# Patient Record
Sex: Male | Born: 1950 | Race: Black or African American | Hispanic: No | Marital: Single | Smoking: Former smoker
Health system: Southern US, Community
[De-identification: ages and names within clinical notes are randomized; demographics above are authoritative.]

## PROBLEM LIST (undated history)

## (undated) ENCOUNTER — Emergency Department (HOSPITAL_COMMUNITY): Admission: EM | Discharge status: Against Medical Advice | Payer: Self-pay

## (undated) DIAGNOSIS — M1990 Unspecified osteoarthritis, unspecified site: Secondary | ICD-10-CM

## (undated) DIAGNOSIS — D649 Anemia, unspecified: Secondary | ICD-10-CM

## (undated) DIAGNOSIS — R942 Abnormal results of pulmonary function studies: Secondary | ICD-10-CM

## (undated) DIAGNOSIS — C159 Malignant neoplasm of esophagus, unspecified: Secondary | ICD-10-CM

## (undated) DIAGNOSIS — M109 Gout, unspecified: Secondary | ICD-10-CM

## (undated) HISTORY — DX: Unspecified osteoarthritis, unspecified site: M19.90

## (undated) HISTORY — DX: Anemia, unspecified: D64.9

## (undated) HISTORY — PX: PORTACATH PLACEMENT: SHX2246

## (undated) HISTORY — PX: EXPLORATORY LAPAROTOMY: SUR591

## (undated) HISTORY — PX: HERNIA REPAIR: SHX51

## (undated) HISTORY — DX: Gout, unspecified: M10.9

## (undated) HISTORY — DX: Malignant neoplasm of esophagus, unspecified: C15.9

---

## 2006-11-24 ENCOUNTER — Ambulatory Visit: Payer: Self-pay | Admitting: Family Medicine

## 2006-11-24 DIAGNOSIS — G43909 Migraine, unspecified, not intractable, without status migrainosus: Secondary | ICD-10-CM | POA: Insufficient documentation

## 2006-11-24 DIAGNOSIS — M545 Low back pain: Secondary | ICD-10-CM

## 2006-11-24 DIAGNOSIS — Z87898 Personal history of other specified conditions: Secondary | ICD-10-CM | POA: Insufficient documentation

## 2006-11-25 ENCOUNTER — Encounter (INDEPENDENT_AMBULATORY_CARE_PROVIDER_SITE_OTHER): Payer: Self-pay | Admitting: Family Medicine

## 2006-11-28 ENCOUNTER — Encounter (INDEPENDENT_AMBULATORY_CARE_PROVIDER_SITE_OTHER): Payer: Self-pay | Admitting: Family Medicine

## 2006-12-12 ENCOUNTER — Ambulatory Visit: Payer: Self-pay | Admitting: Family Medicine

## 2006-12-12 DIAGNOSIS — M25569 Pain in unspecified knee: Secondary | ICD-10-CM

## 2006-12-12 DIAGNOSIS — F341 Dysthymic disorder: Secondary | ICD-10-CM

## 2007-01-02 ENCOUNTER — Encounter (INDEPENDENT_AMBULATORY_CARE_PROVIDER_SITE_OTHER): Payer: Self-pay | Admitting: Family Medicine

## 2007-01-09 ENCOUNTER — Ambulatory Visit: Payer: Self-pay | Admitting: Family Medicine

## 2007-01-09 ENCOUNTER — Telehealth (INDEPENDENT_AMBULATORY_CARE_PROVIDER_SITE_OTHER): Payer: Self-pay | Admitting: *Deleted

## 2007-01-09 DIAGNOSIS — I10 Essential (primary) hypertension: Secondary | ICD-10-CM

## 2007-01-20 ENCOUNTER — Telehealth (INDEPENDENT_AMBULATORY_CARE_PROVIDER_SITE_OTHER): Payer: Self-pay | Admitting: *Deleted

## 2007-01-20 ENCOUNTER — Ambulatory Visit (HOSPITAL_COMMUNITY): Admission: RE | Admit: 2007-01-20 | Discharge: 2007-01-20 | Payer: Self-pay | Admitting: Family Medicine

## 2007-02-16 HISTORY — PX: COLONOSCOPY: SHX174

## 2007-02-22 ENCOUNTER — Telehealth (INDEPENDENT_AMBULATORY_CARE_PROVIDER_SITE_OTHER): Payer: Self-pay | Admitting: Family Medicine

## 2007-02-28 ENCOUNTER — Encounter (INDEPENDENT_AMBULATORY_CARE_PROVIDER_SITE_OTHER): Payer: Self-pay | Admitting: Family Medicine

## 2007-03-20 ENCOUNTER — Ambulatory Visit (HOSPITAL_COMMUNITY): Admission: RE | Admit: 2007-03-20 | Discharge: 2007-03-20 | Payer: Self-pay | Admitting: Gastroenterology

## 2007-03-20 ENCOUNTER — Encounter: Payer: Self-pay | Admitting: Gastroenterology

## 2007-03-20 ENCOUNTER — Encounter (INDEPENDENT_AMBULATORY_CARE_PROVIDER_SITE_OTHER): Payer: Self-pay | Admitting: Family Medicine

## 2007-03-20 ENCOUNTER — Ambulatory Visit: Payer: Self-pay | Admitting: Gastroenterology

## 2007-03-29 ENCOUNTER — Encounter (INDEPENDENT_AMBULATORY_CARE_PROVIDER_SITE_OTHER): Payer: Self-pay | Admitting: Family Medicine

## 2007-04-05 ENCOUNTER — Ambulatory Visit: Payer: Self-pay | Admitting: Family Medicine

## 2007-04-05 DIAGNOSIS — F528 Other sexual dysfunction not due to a substance or known physiological condition: Secondary | ICD-10-CM

## 2007-05-23 ENCOUNTER — Ambulatory Visit: Payer: Self-pay | Admitting: Family Medicine

## 2007-06-28 ENCOUNTER — Ambulatory Visit: Payer: Self-pay | Admitting: Family Medicine

## 2007-06-28 LAB — CONVERTED CEMR LAB
Bilirubin Urine: NEGATIVE
Glucose, Urine, Semiquant: NEGATIVE
Ketones, urine, test strip: NEGATIVE
Nitrite: NEGATIVE
WBC Urine, dipstick: NEGATIVE

## 2007-07-25 ENCOUNTER — Encounter (INDEPENDENT_AMBULATORY_CARE_PROVIDER_SITE_OTHER): Payer: Self-pay | Admitting: Family Medicine

## 2007-08-22 ENCOUNTER — Ambulatory Visit: Payer: Self-pay | Admitting: Family Medicine

## 2007-08-22 DIAGNOSIS — I359 Nonrheumatic aortic valve disorder, unspecified: Secondary | ICD-10-CM | POA: Insufficient documentation

## 2007-09-05 ENCOUNTER — Telehealth (INDEPENDENT_AMBULATORY_CARE_PROVIDER_SITE_OTHER): Payer: Self-pay | Admitting: *Deleted

## 2007-09-20 ENCOUNTER — Ambulatory Visit: Payer: Self-pay | Admitting: Family Medicine

## 2007-09-21 ENCOUNTER — Telehealth (INDEPENDENT_AMBULATORY_CARE_PROVIDER_SITE_OTHER): Payer: Self-pay | Admitting: Family Medicine

## 2007-09-25 ENCOUNTER — Encounter (INDEPENDENT_AMBULATORY_CARE_PROVIDER_SITE_OTHER): Payer: Self-pay | Admitting: Family Medicine

## 2007-10-02 ENCOUNTER — Encounter (INDEPENDENT_AMBULATORY_CARE_PROVIDER_SITE_OTHER): Payer: Self-pay | Admitting: Family Medicine

## 2007-10-18 ENCOUNTER — Telehealth (INDEPENDENT_AMBULATORY_CARE_PROVIDER_SITE_OTHER): Payer: Self-pay | Admitting: *Deleted

## 2007-12-22 ENCOUNTER — Telehealth (INDEPENDENT_AMBULATORY_CARE_PROVIDER_SITE_OTHER): Payer: Self-pay | Admitting: *Deleted

## 2008-01-03 ENCOUNTER — Telehealth (INDEPENDENT_AMBULATORY_CARE_PROVIDER_SITE_OTHER): Payer: Self-pay | Admitting: *Deleted

## 2008-04-10 ENCOUNTER — Telehealth (INDEPENDENT_AMBULATORY_CARE_PROVIDER_SITE_OTHER): Payer: Self-pay | Admitting: *Deleted

## 2010-03-17 NOTE — Letter (Signed)
Summary: disability determination  disability determination   Imported By: Curtis Sites 04/07/2007 11:24:32  _____________________________________________________________________  External Attachment:    Type:   Image     Comment:   External Document

## 2010-04-03 ENCOUNTER — Encounter (INDEPENDENT_AMBULATORY_CARE_PROVIDER_SITE_OTHER): Payer: Self-pay

## 2010-04-08 NOTE — Letter (Signed)
Summary: Recall Colonoscopy/Endoscopy, Change to Office Visit  [facility]  [address]   [place], [place] [address]   Phone: [phone]  Fax: [phone]      April 03, 2010   Anthony Cameron [address] [place], [place]  [address] 08-Jul-1950   Dear [name],   According to our records, it is time for you to schedule a Colonoscopy/Endoscopy. However, after reviewing your medical record, we recommend an office visit in order to determine your need for a repeat procedure.  Please call [phone] at your convenience to schedule an office visit. If you have any questions or concerns, please feel free to contact our office.   Sincerely,   Cloria Spring LPN  [facility] Ph: [phone]   Fax: [phone]

## 2010-06-30 NOTE — Op Note (Signed)
NAMEDAVIDE, RISDON            ACCOUNT NO.:  [id number]   MEDICAL RECORD NO.:  [id number]          PATIENT TYPE:  AMB   LOCATION:  DAY                           FACILITY:  [facility]   PHYSICIAN:  Kassie Mends, M.D.      DATE OF BIRTH:  05-01-1950   DATE OF PROCEDURE:  03/20/2007  DATE OF DISCHARGE:                               OPERATIVE REPORT   PROCEDURE:  Colonoscopy with snare cautery and cold forceps polypectomy.   INDICATIONS:  Mr. Demore is a 60 year old male who presents for  average risk colon cancer screening.   FINDINGS:  1. Multiple rectosigmoid polyps removed via snare.  2. Two 3-4 mm polyps removed via cold forceps.  The polyp were located      in the rectum.  3. An 8 mm sessile rectal polyp removed via snare cautery.  4. Otherwise no internal hemorrhoids, diverticula, inflammatory      changes, or arteriovenous malformations.   RECOMMENDATIONS:  1. Screening colonoscopy in 3 years due to 8 to 10 polyps being      removed from his rectosigmoid area.  2. He should follow a high fiber diet.  He is given a handout on high-      fiber diet.  3. He should have no aspirin, NSAIDs or anticoagulation for 7 days.  4. Will call Mr. Keena with the results of his biopsies.   DESCRIPTION OF PROCEDURE:  Physical exam was performed.  Informed  consent was obtained from the patient explaining the benefits, risks and  alternatives to the procedure.  The patient was connected to the monitor  and placed in left lateral position.  Continuous oxygen was provided by  nasal cannula and IV medicine administered through an indwelling  cannula.  After administration of sedation and rectal exam, the  patient's rectum was intubated and the scope was advanced under direct  visualization to the cecum.  Scope was removed slowly by carefully  examining the color, texture, anatomy, and integrity of the mucosa on  the way out.  The patient was recovered in endoscopy and discharged home  in  satisfactory condition.      Kassie Mends, M.D.  Electronically Signed     SM/MEDQ  D:  03/20/2007  T:  03/20/2007  Job:  [id number]   cc:   Franchot Heidelberg, M.D.

## 2011-05-27 ENCOUNTER — Emergency Department (HOSPITAL_COMMUNITY)
Admission: EM | Admit: 2011-05-27 | Discharge: 2011-05-27 | Disposition: A | Discharge status: Home / Self Care | Payer: Self-pay | Attending: Emergency Medicine | Admitting: Emergency Medicine

## 2011-05-27 ENCOUNTER — Emergency Department (HOSPITAL_COMMUNITY): Payer: Self-pay

## 2011-05-27 DIAGNOSIS — IMO0001 Reserved for inherently not codable concepts without codable children: Secondary | ICD-10-CM | POA: Insufficient documentation

## 2011-05-27 DIAGNOSIS — S161XXA Strain of muscle, fascia and tendon at neck level, initial encounter: Secondary | ICD-10-CM

## 2011-05-27 DIAGNOSIS — S139XXA Sprain of joints and ligaments of unspecified parts of neck, initial encounter: Secondary | ICD-10-CM | POA: Insufficient documentation

## 2011-05-27 DIAGNOSIS — M542 Cervicalgia: Secondary | ICD-10-CM | POA: Insufficient documentation
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
## 2011-05-27 NOTE — ED Notes (Signed)
Pt DC to home with steady gait 

## 2011-05-27 NOTE — Discharge Instructions (Signed)
Cervical Strain Care After A cervical strain is when the muscles and ligaments in your neck have been stretched. The bones are not broken. If you had any problems moving your arms or legs immediately after the injury, even if the problem has gone away, make sure to tell this to your caregiver.  HOME CARE INSTRUCTIONS   While awake, apply ice packs to the neck or areas of pain about every 1 to 2 hours, for 15 to 20 minutes at a time. Do this for 2 days. If you were given a cervical collar for support, ask your caregiver if you may remove it for bathing or applying ice.   If given a cervical collar, wear as instructed. Do not remove any collar unless instructed by a caregiver.   Only take over-the-counter or prescription medicines for pain, discomfort, or fever as directed by your caregiver.  Recheck with the hospital or clinic after a radiologist has read your X-rays. Recheck with the hospital or clinic to make sure the initial readings are correct. Do this also to determine if you need further studies. It is your responsibility to find out your X-ray results. X-rays are sometimes repeated in one week to ten days. These are often repeated to make sure that a hairline fracture was not overlooked. Ask your caregiver how you are to find out about your radiology (X-ray) results. SEEK IMMEDIATE MEDICAL CARE IF:   You have increasing pain in your neck.   You develop difficulties swallowing or breathing.   You have numbness, weakness, or movement problems in the arms or legs.   You have difficulty walking.   You develop bowel or bladder retention or incontinence.   You have problems with walking.  MAKE SURE YOU:   Understand these instructions.   Will watch your condition.   Will get help right away if you are not doing well or get worse.  Document Released: 02/01/2005 Document Revised: 10/14/2010 Document Reviewed: 09/15/2007 [facility] Patient Information 2012 [place], [place].   The x-rays  show no acute bony abnormalities.  Take ibuprofen up to 800 mg every 8 hrs with food.  Find a primary care MD.

## 2011-05-27 NOTE — ED Provider Notes (Signed)
Medical screening examination/treatment/procedure(s) were performed by non-physician practitioner and as supervising physician I was immediately available for consultation/collaboration.  Sumedha Munnerlyn, MD 05/27/11 2200 

## 2014-05-02 ENCOUNTER — Encounter: Payer: Self-pay | Admitting: Gastroenterology

## 2014-05-17 DIAGNOSIS — C159 Malignant neoplasm of esophagus, unspecified: Secondary | ICD-10-CM

## 2014-05-17 HISTORY — DX: Malignant neoplasm of esophagus, unspecified: C15.9

## 2014-05-27 ENCOUNTER — Encounter: Payer: Self-pay | Admitting: Gastroenterology

## 2014-05-27 ENCOUNTER — Other Ambulatory Visit: Payer: Self-pay

## 2014-05-27 ENCOUNTER — Ambulatory Visit (INDEPENDENT_AMBULATORY_CARE_PROVIDER_SITE_OTHER): Payer: Self-pay | Admitting: Gastroenterology

## 2014-05-27 VITALS — BP 119/57 | HR 73 | Temp 98.5°F | Ht 72.0 in | Wt 148.6 lb

## 2014-05-27 DIAGNOSIS — R1310 Dysphagia, unspecified: Secondary | ICD-10-CM | POA: Insufficient documentation

## 2014-05-27 DIAGNOSIS — R1319 Other dysphagia: Secondary | ICD-10-CM | POA: Insufficient documentation

## 2014-05-27 DIAGNOSIS — R1314 Dysphagia, pharyngoesophageal phase: Secondary | ICD-10-CM

## 2014-05-27 DIAGNOSIS — K219 Gastro-esophageal reflux disease without esophagitis: Secondary | ICD-10-CM

## 2014-05-27 DIAGNOSIS — D649 Anemia, unspecified: Secondary | ICD-10-CM

## 2014-05-27 DIAGNOSIS — Z86010 Personal history of colonic polyps: Secondary | ICD-10-CM | POA: Insufficient documentation

## 2014-05-27 MED ORDER — ESOMEPRAZOLE MAGNESIUM 20 MG PO CPDR: 20.0000 mg | DELAYED_RELEASE_CAPSULE | Freq: Two times a day (BID) | ORAL | Status: DC

## 2014-05-27 NOTE — Assessment & Plan Note (Signed)
History of multiple polyps removed back in 2009. Patient did not have follow-up colonoscopy in 2012 as recommended. Normocytic anemia, plus or minus iron deficiency as previously noted. Encouraged colonoscopy at this time. Patient agreeable.  I have discussed the risks, alternatives, benefits with regards to but not limited to the risk of reaction to medication, bleeding, infection, perforation and the patient is agreeable to proceed. Written consent to be obtained.

## 2014-05-27 NOTE — Patient Instructions (Signed)
1. Colonoscopy and upper endoscopy with possible esophageal stretching with Dr. [name]. See separate instructions.  2. Nexium 20mg  twice daily before breakfast and evening meal. Samples provided.  3. Limit BC powders and naproxyn as much as possible until you have your procedures.
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
## 2014-05-27 NOTE — Assessment & Plan Note (Signed)
New onset retrosternal burning/throat burning with meals. Painful with eating and solid food dysphagia. Has affected his ability to eat. Patient really denies any weight loss. None documented in the past one month. Differential diagnosis includes erosive lash ulcerative reflux esophagitis, candida or viral esophagitis, malignancy, esophageal stricture. Of note patient has had normocytic anemia dating back at least to July 2015. Has been on iron therapy. Iron was 28 at the time but other anemia parameters unavailable. His ferritin was normal. Consumes aspirin products regularly. Cannot exclude peptic ulcer disease.  Recommend Nexium 20 mg twice a day, samples provided. Patient has no medication coverage. EGD with esophageal dilation near future with Dr. [name].  I have discussed the risks, alternatives, benefits with regards to but not limited to the risk of reaction to medication, bleeding, infection, perforation and the patient is agreeable to proceed. Written consent to be obtained.

## 2014-05-27 NOTE — Progress Notes (Signed)
cc'ed to pcp °

## 2014-06-03 ENCOUNTER — Encounter (HOSPITAL_COMMUNITY)
Admission: RE | Disposition: A | Discharge status: Home / Self Care | Payer: Self-pay | Source: Ambulatory Visit | Attending: Gastroenterology

## 2014-06-03 ENCOUNTER — Encounter (HOSPITAL_COMMUNITY): Payer: Self-pay | Admitting: *Deleted

## 2014-06-03 ENCOUNTER — Other Ambulatory Visit: Payer: Self-pay

## 2014-06-03 ENCOUNTER — Ambulatory Visit (HOSPITAL_COMMUNITY)
Admission: RE | Admit: 2014-06-03 | Discharge: 2014-06-03 | Disposition: A | Discharge status: Home / Self Care | Payer: Medicaid Other | Source: Ambulatory Visit | Attending: Gastroenterology | Admitting: Gastroenterology

## 2014-06-03 ENCOUNTER — Telehealth: Payer: Self-pay | Admitting: Gastroenterology

## 2014-06-03 DIAGNOSIS — K648 Other hemorrhoids: Secondary | ICD-10-CM | POA: Diagnosis not present

## 2014-06-03 DIAGNOSIS — C159 Malignant neoplasm of esophagus, unspecified: Secondary | ICD-10-CM | POA: Insufficient documentation

## 2014-06-03 DIAGNOSIS — Z1211 Encounter for screening for malignant neoplasm of colon: Secondary | ICD-10-CM | POA: Diagnosis present

## 2014-06-03 DIAGNOSIS — R634 Abnormal weight loss: Secondary | ICD-10-CM | POA: Diagnosis present

## 2014-06-03 DIAGNOSIS — K2950 Unspecified chronic gastritis without bleeding: Secondary | ICD-10-CM | POA: Insufficient documentation

## 2014-06-03 DIAGNOSIS — M1990 Unspecified osteoarthritis, unspecified site: Secondary | ICD-10-CM | POA: Insufficient documentation

## 2014-06-03 DIAGNOSIS — R1314 Dysphagia, pharyngoesophageal phase: Secondary | ICD-10-CM

## 2014-06-03 DIAGNOSIS — D649 Anemia, unspecified: Secondary | ICD-10-CM | POA: Insufficient documentation

## 2014-06-03 DIAGNOSIS — R1310 Dysphagia, unspecified: Secondary | ICD-10-CM | POA: Diagnosis present

## 2014-06-03 DIAGNOSIS — K219 Gastro-esophageal reflux disease without esophagitis: Secondary | ICD-10-CM | POA: Diagnosis not present

## 2014-06-03 DIAGNOSIS — K2289 Other specified disease of esophagus: Secondary | ICD-10-CM

## 2014-06-03 DIAGNOSIS — Z7982 Long term (current) use of aspirin: Secondary | ICD-10-CM | POA: Diagnosis not present

## 2014-06-03 DIAGNOSIS — M109 Gout, unspecified: Secondary | ICD-10-CM | POA: Insufficient documentation

## 2014-06-03 DIAGNOSIS — Z860100 Personal history of colon polyps, unspecified: Secondary | ICD-10-CM

## 2014-06-03 DIAGNOSIS — Z87891 Personal history of nicotine dependence: Secondary | ICD-10-CM | POA: Insufficient documentation

## 2014-06-03 DIAGNOSIS — Z791 Long term (current) use of non-steroidal anti-inflammatories (NSAID): Secondary | ICD-10-CM | POA: Insufficient documentation

## 2014-06-03 DIAGNOSIS — K228 Other specified diseases of esophagus: Secondary | ICD-10-CM

## 2014-06-03 DIAGNOSIS — Z79899 Other long term (current) drug therapy: Secondary | ICD-10-CM | POA: Insufficient documentation

## 2014-06-03 HISTORY — PX: ESOPHAGOGASTRODUODENOSCOPY: SHX5428

## 2014-06-03 HISTORY — PX: COLONOSCOPY: SHX5424

## 2014-06-03 HISTORY — PX: ESOPHAGEAL DILATION: SHX303

## 2014-06-03 SURGERY — COLONOSCOPY
Anesthesia: Moderate Sedation

## 2014-06-03 MED ORDER — MEPERIDINE HCL 100 MG/ML IJ SOLN
INTRAMUSCULAR | Status: AC
  Filled 2014-06-03: qty 2

## 2014-06-03 MED ORDER — SODIUM CHLORIDE 0.9 % IV SOLN
INTRAVENOUS | Status: DC
  Administered 2014-06-03: 11:00:00 via INTRAVENOUS

## 2014-06-03 MED ORDER — LIDOCAINE VISCOUS 2 % MT SOLN
OROMUCOSAL | Status: DC | PRN
  Administered 2014-06-03: 1 via OROMUCOSAL

## 2014-06-03 MED ORDER — MIDAZOLAM HCL 5 MG/5ML IJ SOLN
INTRAMUSCULAR | Status: AC
  Filled 2014-06-03: qty 10

## 2014-06-03 MED ORDER — MIDAZOLAM HCL 5 MG/5ML IJ SOLN
INTRAMUSCULAR | Status: DC | PRN
  Administered 2014-06-03 (×2): 2 mg via INTRAVENOUS
  Administered 2014-06-03: 1 mg via INTRAVENOUS
  Administered 2014-06-03: 2 mg via INTRAVENOUS
  Administered 2014-06-03: 1 mg via INTRAVENOUS

## 2014-06-03 MED ORDER — STERILE WATER FOR IRRIGATION IR SOLN
Status: DC | PRN
  Administered 2014-06-03: 12:00:00

## 2014-06-03 MED ORDER — MEPERIDINE HCL 100 MG/ML IJ SOLN
INTRAMUSCULAR | Status: DC | PRN
  Administered 2014-06-03 (×2): 25 mg via INTRAVENOUS

## 2014-06-03 MED ORDER — LIDOCAINE VISCOUS 2 % MT SOLN
OROMUCOSAL | Status: AC
  Filled 2014-06-03: qty 15

## 2014-06-03 MED ORDER — MINERAL OIL PO OIL
TOPICAL_OIL | ORAL | Status: AC
  Filled 2014-06-03: qty 30

## 2014-06-03 NOTE — Telephone Encounter (Signed)
PT NEEDS TO SEE DR. Ardis Hughs IN [place] FOR AN ENDOSCOPIC ULTRASOUND, Dx DISTAL ESOPHAGEAL MASS. HE NEEDS A CT OF CHEST AND ABDOMEN W/ IV AND ORAL CONTRAST AND TO SEE THE [facility] ONCLOLOGY. OPV IN 6 MOS W/ SLF E30 ESOPHAGEAL MASS.

## 2014-06-03 NOTE — Op Note (Signed)
[facility] [address] [place], [address]   ENDOSCOPY PROCEDURE REPORT  PATIENT: Anthony Cameron, Anthony Cameron  MR#: [id number] BIRTHDATE: 05-11-1950 , 18  yrs. old GENDER: male  ENDOSCOPIST: [name], MD REFERRED LS:[id number] McElroy, PA-C PROCEDURE DATE: 07-03-14 PROCEDURE:   EGD w/ biopsy  INDICATIONS:dysphagia.   weight loss. MEDICATIONS: Versed 1 mg IV and Demerol 25 mg IV TOPICAL ANESTHETIC:   Viscous Xylocaine ASA CLASS:  DESCRIPTION OF PROCEDURE:     Physical exam was performed.  Informed consent was obtained from the patient after explaining the benefits, risks, and alternatives to the procedure.  The patient was connected to the monitor and placed in the left lateral position.  Continuous oxygen was provided by nasal cannula and IV medicine administered through an indwelling cannula.  After administration of sedation, the patients esophagus was intubated and the EC-3890Li ([id number])  endoscope was advanced under direct visualization to the second portion of the duodenum.  The scope was removed slowly by carefully examining the color, texture, anatomy, and integrity of the mucosa on the way out.  The patient was recovered in endoscopy and discharged home in satisfactory condition.   ESOPHAGUS: A circumferential mass was found in the distal esophagus. Multiple biopsies were performed using cold forceps. UES 15 CM FROM THE TEETH. MASS STARTS 38 CM FROM THE TEETH AND EXTENDS TOTHE GE JXN 46 CM FROMTHE TEETH.   STOMACH: Mild erosive gastritis (inflammation) was found in the gastric antrum.  Multiple biopsies were performed using cold forceps. DUODENUM: The duodenal mucosa showed no abnormalities in the bulb and 2nd part of the duodenum. COMPLICATIONS: There were no immediate complications.  ENDOSCOPIC IMPRESSION: 1.   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   MILD Erosive gastritis  RECOMMENDATIONS: Use NEXIUM 30 minutes prior to your first  meal. FOLLOW A SOFT MECHANICAL DIET. CIB, ENSURE, OR BOOST QID AWAIT BIOPSY. REFER TO SEE DR.  Ardis Hughs FOR AN ENDOSCOPIC ULTRASOUND & CT OF CHEST AND ABDOMEN ONCOLOGY REFERRAL Next colonoscopy in 5 years.  REPEAT EXAM: eSigned:  [name], MD 2014/07/03 1:57 PM   CPT CODES: ICD CODES:  The ICD and CPT codes recommended by this software are interpretations from the data that the clinical staff has captured with the software.  The verification of the translation of this report to the ICD and CPT codes and modifiers is the sole responsibility of the health care institution and practicing physician where this report was generated.  [place]. will not be held responsible for the validity of the ICD and CPT codes included on this report.  AMA assumes no liability for data contained or not contained herein. CPT is a Designer, television/film set of the [employer].

## 2014-06-03 NOTE — Telephone Encounter (Signed)
Called pt and spoke with pts sister Kieth Brightly) and she is aware of referrals and of CT scan appointment. She is also aware of needing to pick up contrast in xray.

## 2014-06-03 NOTE — Op Note (Signed)
[facility] [address] [place], [address]   COLONOSCOPY PROCEDURE REPORT  PATIENT: Anthony Cameron, Anthony Cameron  MR#: [id number] BIRTHDATE: 10/30/1950 , 10  yrs. old GENDER: male ENDOSCOPIST: [name], MD REFERRED BW:[id number] McElroy, PA-C PROCEDURE DATE:  Jun 15, 2014 PROCEDURE:   Colonoscopy, screening INDICATIONS:high risk patient with personal history of colonic polyps. MEDICATIONS: Demerol 50 mg IV and Versed 5 mg IV  DESCRIPTION OF PROCEDURE:    Physical exam was performed.  Informed consent was obtained from the patient after explaining the benefits, risks, and alternatives to procedure.  The patient was connected to monitor and placed in left lateral position. Continuous oxygen was provided by nasal cannula and IV medicine administered through an indwelling cannula.  After administration of sedation and rectal exam, the patients rectum was intubated and the EC-3890Li ([id number])  colonoscope was advanced under direct visualization to the cecum.  The scope was removed slowly by carefully examining the color, texture, anatomy, and integrity mucosa on the way out.  The patient was recovered in endoscopy and discharged home in satisfactory condition.    COLON FINDINGS: The colonic mucosa appeared normal and Moderate sized internal hemorrhoids were found. PREP QUALITY: good. CECAL W/D TIME: 12        minutes  COMPLICATIONS: None  ENDOSCOPIC IMPRESSION: 1.   The colonic mucosa appeared normal 2.   Moderate sized internal hemorrhoids  RECOMMENDATIONS: Use NEXIUM 30 minutes prior to your first meal. FOLLOW A SOFT MECHANICAL DIET. CIB, ENSURE, OR BOOST QID AWAIT BIOPSY. REFER TO SEE DR.  Ardis Hughs FOR AN ENDOSCOPIC ULTRASOUND & CT OF CHEST AND ABDOMEN ONCOLOGY REFERRAL Next colonoscopy in 5 years.    _______________________________ Lorrin MaisDanie Binder, MD 06/15/14 2:00 PM    CPT CODES: ICD CODES:  The ICD and CPT codes recommended by this  software are interpretations from the data that the clinical staff has captured with the software.  The verification of the translation of this report to the ICD and CPT codes and modifiers is the sole responsibility of the health care institution and practicing physician where this report was generated.  [place]. will not be held responsible for the validity of the ICD and CPT codes included on this report.  AMA assumes no liability for data contained or not contained herein. CPT is a Designer, television/film set of the [employer].

## 2014-06-03 NOTE — Discharge Instructions (Signed)
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 You have small  internal hemorrhoids. You have gastritis MOST LIKELY DUE TO aspirin use.  I biopsied your ESOPHAGUS AND stomach.   Use NEXIUM 30 minutes prior to your first meal.  FOLLOW A SOFT MECHANICAL DIET.  MEATS SHOULD BE CHOPPED OR GROUND ONLY. DO NOT EAT CHUNKS OF ANYTHING. SEE INFO BELOW.  YOUR BIOPSY RESULTS WILL BE AVAILABLE BY Friday APR 22.     YOU NEED TO SEE DR. Ardis Hughs IN [place] FOR AN ENDOSCOPIC ULTRASOUND. YOU NEEDS A CT OF YOUR CHEST AND ABDOMEN AND YOU NEED TO SEE THE CANCER DOCTORS.  Next colonoscopy in 5 years.   ENDOSCOPY Care After Read the instructions outlined below and refer to this sheet in the next week. These discharge instructions provide you with general information on caring for yourself after you leave the hospital. While your treatment has been planned according to the most current medical practices available, unavoidable complications occasionally occur. If you have any problems or questions after discharge, call DR. Cayleigh Paull, [phone].  ACTIVITY  You may resume your regular activity, but move at a slower pace for the next 24 hours.   Take frequent rest periods for the next 24 hours.   Walking will help get rid of the air and reduce the bloated feeling in your belly (abdomen).   No driving for 24 hours (because of the medicine (anesthesia) used during the test).   You may shower.   Do not sign any important legal documents or operate any machinery for 24 hours (because of the anesthesia used during the test).    NUTRITION  Drink plenty of fluids.   You may resume your normal diet as instructed by your doctor.   Begin with a light meal and progress to your normal diet. Heavy or fried foods are harder to digest and may make you feel sick to your stomach (nauseated).   Avoid alcoholic beverages for 24 hours or as instructed.    MEDICATIONS  You may resume  your normal medications.   WHAT YOU CAN EXPECT TODAY  Some feelings of bloating in the abdomen.   Passage of more gas than usual.   Spotting of blood in your stool or on the toilet paper  .  IF YOU HAD POLYPS REMOVED DURING THE ENDOSCOPY:  Eat a soft diet IF YOU HAVE NAUSEA, BLOATING, ABDOMINAL PAIN, OR VOMITING.    FINDING OUT THE RESULTS OF YOUR TEST Not all test results are available during your visit. DR. [name] WILL CALL YOU WITHIN 14 DAYS OF YOUR PROCEDUE WITH YOUR RESULTS. Do not assume everything is normal if you have not heard from DR. Hence Derrick, CALL HER OFFICE AT [phone].  SEEK IMMEDIATE MEDICAL ATTENTION AND CALL THE OFFICE: [phone] IF:  You have more than a spotting of blood in your stool.   Your belly is swollen (abdominal distention).   You are nauseated or vomiting.   You have a temperature over 101F.   You have abdominal pain or discomfort that is severe or gets worse throughout the day.   Gastritis  Gastritis is an inflammation (the body's way of reacting to injury and/or infection) of the stomach. It is often caused by viral or bacterial (germ) infections. It can also be caused BY ALCOHOL, ASPIRIN, BC/GOODY POWDER'S, (IBUPROFEN) MOTRIN, OR ALEVE (NAPROXEN), chemicals (including alcohol), SPICY FOODS, and medications. This illness may be associated with generalized malaise (feeling tired, not well), UPPER ABDOMINAL  STOMACH cramps, and fever. One common bacterial cause of gastritis is an organism known as H. Pylori. This can be treated with antibiotics.   Hemorrhoids Hemorrhoids are dilated (enlarged) veins around the rectum. Sometimes clots will form in the veins. This makes them swollen and painful. These are called thrombosed hemorrhoids. Causes of hemorrhoids include:  Constipation.   Straining to have a bowel movement.   HEAVY LIFTING HOME CARE INSTRUCTIONS  Eat a well balanced diet and drink 6 to 8 glasses of water every day to avoid  constipation. You may also use a bulk laxative.   Avoid straining to have bowel movements.   Keep anal area dry and clean.   Do not use a donut shaped pillow or sit on the toilet for long periods. This increases blood pooling and pain.   Move your bowels when your body has the urge; this will require less straining and will decrease pain and pressure.   SOFT MECHANICAL DIET This SOFT MECHANICAL DIET is restricted to:  Foods that are moist, soft-textured, and easy to chew and swallow.   Meats that are ground or are minced no larger than one-quarter inch pieces. Meats are moist with gravy or sauce added.   Foods that do not include bread or bread-like textures except soft pancakes, well-moistened with syrup or sauce.   Textures with some chewing ability required.   Casseroles without rice.   Cooked vegetables that are less than half an inch in size and easily mashed with a fork. No cooked corn, peas, broccoli, cauliflower, cabbage, Brussels sprouts, asparagus, or other fibrous, non-tender or rubbery cooked vegetables.   Canned fruit except for pineapple. Fruit must be cut into pieces no larger than half an inch in size.   Foods that do not include nuts, seeds, coconut, or sticky textures.   FOOD TEXTURES FOR DYSPHAGIA DIET LEVEL 2 -SOFT MECHANICAL DIET (includes all foods on Dysphagia Diet Level 1 - Pureed, in addition to the foods listed below)  FOOD GROUP: Breads. RECOMMENDED: Soft pancakes, well-moistened with syrup or sauce.  AVOID: All others.  FOOD GROUP: Cereals.  RECOMMENDED: Cooked cereals with little texture, including oatmeal. Unprocessed wheat bran stirred into cereals for bulk. Note: If thin liquids are restricted, it is important that all of the liquid is absorbed into the cereal.  AVOID: All dry cereals and any cooked cereals that may contain flax seeds or other seeds or nuts. Whole-grain, dry, or coarse cereals. Cereals with nuts, seeds, dried fruit, and/or  coconut.  FOOD GROUP: Desserts. RECOMMENDED: Pudding, custard. Soft fruit pies with bottom crust only. Canned fruit (excluding pineapple). Soft, moist cakes with icing.Frozen malts, milk shakes, frozen yogurt, eggnog, nutritional supplements, ice cream, sherbet, regular or sugar-free gelatin, or any foods that become thin liquid at either room (70 F) or body temperature (98 F).  AVOID: Dry, coarse cakes and cookies. Anything with nuts, seeds, coconut, pineapple, or dried fruit. Breakfast yogurt with nuts. Rice or bread pudding.  FOOD GROUP: Fats. RECOMMENDED: Butter, margarine, cream for cereal (depending on liquid consistency recommendations), gravy, cream sauces, sour cream, sour cream dips with soft additives, mayonnaise, salad dressings, cream cheese, cream cheese spreads with soft additives, whipped toppings.  AVOID: All fats with coarse or chunky additives.  FOOD GROUP: Fruits. RECOMMENDED: Soft drained, canned, or cooked fruits without seeds or skin. Fresh soft and ripe banana. Fruit juices with a small amount of pulp. If thin liquids are restricted, fruit juices should be thickened to appropriate consistency.  AVOID: Fresh  or frozen fruits. Cooked fruit with skin or seeds. Dried fruits. Fresh, canned, or cooked pineapple.  FOOD GROUP: Meats and Meat Substitutes. (Meat pieces should not exceed 1/4 of an inch cube and should be tender.) RECOMMENDED: Moistened ground or cooked meat, poultry, or fish. Moist ground or tender meat may be served with gravy or sauce. Casseroles without rice. Moist macaroni and cheese, well-cooked pasta with meat sauce, tuna noodle casserole, soft, moist lasagna. Moist meatballs, meatloaf, or fish loaf. Protein salads, such as tuna or egg without large chunks, celery, or onion. Cottage cheese, smooth quiche without large chunks. Poached, scrambled, or soft-cooked eggs (egg yolks should not be runny but should be moist and able to be mashed with butter,  margarine, or other moisture added to them). (Cook eggs to 160 F or use pasteurized eggs for safety.) Souffls may have small, soft chunks. Tofu. Well-cooked, slightly mashed, moist legumes, such as baked beans. All meats or protein substitutes should be served with sauces or moistened to help maintain cohesiveness in the oral cavity.  AVOID: Dry meats, tough meats (such as bacon, sausage, hot dogs, bratwurst). Dry casseroles or casseroles with rice or large chunks. Peanut butter. Cheese slices and cubes. Hard-cooked or crisp fried eggs. Sandwiches.Pizza.  FOOD GROUP: Potatoes and Starches. RECOMMENDED: Well-cooked, moistened, boiled, baked, or mashed potatoes. Well-cooked shredded hash brown potatoes that are not crisp. (All potatoes need to be moist and in sauces.)Well-cooked noodles in sauce. Spaetzel or soft dumplings that have been moistened with butter or gravy.  AVOID: Potato skins and chips. Fried or French-fried potatoes. Rice.  FOOD GROUP: Soups. RECOMMENDED: Soups with easy-to-chew or easy-to-swallow meats or vegetables: Particle sizes in soups should be less than 1/2 inch. Soups will need to be thickened to appropriate consistency if soup is thinner than prescribed liquid consistency.  AVOID: Soups with large chunks of meat and vegetables. Soups with rice, corn, peas.  FOOD GROUP: Vegetables. RECOMMENDED: All soft, well-cooked vegetables. Vegetables should be less than a half inch. Should be easily mashed with a fork.  AVOID: Cooked corn and peas. Broccoli, cabbage, Brussels sprouts, asparagus, or other fibrous, non-tender or rubbery cooked vegetables.  FOOD GROUP: Miscellaneous. RECOMMENDED: Jams and preserves without seeds, jelly. Sauces, salsas, etc., that may have small tender chunks less than 1/2 inch. Soft, smooth chocolate bars that are easily chewed.  AVOID: Seeds, nuts, coconut, or sticky foods. Chewy candies such as caramels or licorice.

## 2014-06-03 NOTE — Interval H&P Note (Signed)
History and Physical Interval Note:  06/03/2014 11:37 AM  Anthony Cameron  has presented today for surgery, with the diagnosis of gerd/dysphagia/history of colon polyps  The various methods of treatment have been discussed with the patient and family. After consideration of risks, benefits and other options for treatment, the patient has consented to  Procedure(s) with comments: COLONOSCOPY (N/A) - 1245 ESOPHAGOGASTRODUODENOSCOPY (EGD) (N/A) ESOPHAGEAL DILATION (N/A) as a surgical intervention .  The patient's history has been reviewed, patient examined, no change in status, stable for surgery.  I have reviewed the patient's chart and labs.  Questions were answered to the patient's satisfaction.     [employer]

## 2014-06-03 NOTE — Telephone Encounter (Signed)
ON RECALL LIST  °

## 2014-06-03 NOTE — Progress Notes (Signed)
REVIEWED-NO ADDITIONAL RECOMMENDATIONS. 
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
## 2014-06-03 NOTE — Telephone Encounter (Signed)
Referral to oncology and Dr. Ardis Hughs have been ordered in [facility].

## 2014-06-03 NOTE — Telephone Encounter (Signed)
Reminder in epic °

## 2014-06-04 ENCOUNTER — Telehealth: Payer: Self-pay | Admitting: Gastroenterology

## 2014-06-04 NOTE — Telephone Encounter (Signed)
Sandi, I received a request for EUS.  Prefer to wait until after his staging CTs.  If these show obvious metastatic disease then there is no need for EUS staging.  Can you please send me a note with his CT report after it is back and we will go from there?  Thanks   Linna Hoff

## 2014-06-04 NOTE — Telephone Encounter (Signed)
REVIEWED. AGREE. 

## 2014-06-05 ENCOUNTER — Encounter (HOSPITAL_COMMUNITY): Payer: Self-pay | Admitting: Gastroenterology

## 2014-06-05 NOTE — Telephone Encounter (Signed)
CALLED PT'S SISTER. AWARE HIS CA DIAGNOSIS HAS BEEN CONFIRMED. EXPLAINED WHY WE ARE WAITING ON EUS. SHE VOICED HER UNDERSTANDING.

## 2014-06-06 ENCOUNTER — Other Ambulatory Visit: Payer: Self-pay

## 2014-06-06 ENCOUNTER — Ambulatory Visit (HOSPITAL_COMMUNITY)
Admission: RE | Admit: 2014-06-06 | Discharge: 2014-06-06 | Disposition: A | Discharge status: Home / Self Care | Payer: Medicaid Other | Source: Ambulatory Visit | Attending: Gastroenterology | Admitting: Gastroenterology

## 2014-06-06 ENCOUNTER — Encounter (HOSPITAL_COMMUNITY): Payer: Self-pay

## 2014-06-06 DIAGNOSIS — K2289 Other specified disease of esophagus: Secondary | ICD-10-CM

## 2014-06-06 DIAGNOSIS — K229 Disease of esophagus, unspecified: Secondary | ICD-10-CM | POA: Diagnosis not present

## 2014-06-06 DIAGNOSIS — K228 Other specified diseases of esophagus: Secondary | ICD-10-CM

## 2014-06-06 LAB — POCT I-STAT CREATININE: CREATININE: 0.7 mg/dL (ref 0.50–1.35)

## 2014-06-06 MED ORDER — SODIUM CHLORIDE 0.9 % IJ SOLN
INTRAMUSCULAR | Status: AC
  Filled 2014-06-06: qty 45

## 2014-06-06 MED ORDER — IOHEXOL 300 MG/ML  SOLN
100.0000 mL | Freq: Once | INTRAMUSCULAR | Status: AC | PRN
  Administered 2014-06-06: 100 mL via INTRAVENOUS

## 2014-06-06 NOTE — Telephone Encounter (Addendum)
CALLED PT'S SISTER TO DISCUSS RESULTS.  LVM TO CALL TO DISCUSS RESULTS. PT NEEDS PET SCAN PER RECOMMENDATIONS OF ONCOLOGY PRIOR TO APR 27.

## 2014-06-06 NOTE — Addendum Note (Signed)
Addended by: [name] on: 06/06/2014 01:27 PM   Modules accepted: Orders

## 2014-06-07 NOTE — Telephone Encounter (Signed)
CALLED PT'S SISTER. EXPLAINED RESULTS. QUESTIONS ANSWERED. PET DIET INSTRUCTIONS GIVEN.

## 2014-06-07 NOTE — Telephone Encounter (Signed)
Called and spoke with Penny(sister). She is aware of PET scan appointment at [facility] on 06/11/2014 @ 800am.  She is aware that pt needs to be fasting.  Kieth Brightly is requesting Dr. [name] to call her @ [phone]

## 2014-06-11 ENCOUNTER — Encounter (HOSPITAL_COMMUNITY)
Admission: RE | Admit: 2014-06-11 | Discharge: 2014-06-11 | Disposition: A | Discharge status: Home / Self Care | Payer: Medicaid Other | Source: Ambulatory Visit | Attending: Gastroenterology | Admitting: Gastroenterology

## 2014-06-11 DIAGNOSIS — C159 Malignant neoplasm of esophagus, unspecified: Secondary | ICD-10-CM | POA: Diagnosis not present

## 2014-06-11 LAB — GLUCOSE, CAPILLARY: Glucose-Capillary: 93 mg/dL (ref 70–99)

## 2014-06-11 MED ORDER — FLUDEOXYGLUCOSE F - 18 (FDG) INJECTION
7.4000 | Freq: Once | INTRAVENOUS | Status: AC | PRN
  Administered 2014-06-11: 7.4 via INTRAVENOUS

## 2014-06-12 ENCOUNTER — Encounter (HOSPITAL_COMMUNITY): Payer: Self-pay | Admitting: *Deleted

## 2014-06-12 ENCOUNTER — Encounter: Payer: Self-pay | Admitting: *Deleted

## 2014-06-12 ENCOUNTER — Other Ambulatory Visit: Payer: Self-pay

## 2014-06-12 ENCOUNTER — Encounter (HOSPITAL_COMMUNITY): Payer: Self-pay | Admitting: Hematology & Oncology

## 2014-06-12 ENCOUNTER — Encounter (HOSPITAL_COMMUNITY): Payer: Medicaid Other | Attending: Hematology & Oncology | Admitting: Hematology & Oncology

## 2014-06-12 ENCOUNTER — Telehealth: Payer: Self-pay

## 2014-06-12 ENCOUNTER — Encounter (HOSPITAL_COMMUNITY): Payer: Self-pay | Attending: Hematology & Oncology

## 2014-06-12 VITALS — BP 106/51 | HR 63 | Temp 98.0°F | Resp 16 | Ht 71.75 in | Wt 143.8 lb

## 2014-06-12 DIAGNOSIS — F1010 Alcohol abuse, uncomplicated: Secondary | ICD-10-CM

## 2014-06-12 DIAGNOSIS — C159 Malignant neoplasm of esophagus, unspecified: Secondary | ICD-10-CM

## 2014-06-12 DIAGNOSIS — R1310 Dysphagia, unspecified: Secondary | ICD-10-CM

## 2014-06-12 DIAGNOSIS — R918 Other nonspecific abnormal finding of lung field: Secondary | ICD-10-CM

## 2014-06-12 DIAGNOSIS — F17291 Nicotine dependence, other tobacco product, in remission: Secondary | ICD-10-CM

## 2014-06-12 LAB — CBC WITH DIFFERENTIAL/PLATELET
Basophils Absolute: 0 10*3/uL (ref 0.0–0.1)
Basophils Relative: 0 % (ref 0–1)
EOS PCT: 2 % (ref 0–5)
Eosinophils Absolute: 0.1 10*3/uL (ref 0.0–0.7)
HCT: 32.7 % — ABNORMAL LOW (ref 39.0–52.0)
HEMOGLOBIN: 10.2 g/dL — AB (ref 13.0–17.0)
LYMPHS ABS: 1.1 10*3/uL (ref 0.7–4.0)
LYMPHS PCT: 21 % (ref 12–46)
MCH: 26.5 pg (ref 26.0–34.0)
MCHC: 31.2 g/dL (ref 30.0–36.0)
MCV: 84.9 fL (ref 78.0–100.0)
Monocytes Absolute: 0.5 10*3/uL (ref 0.1–1.0)
Monocytes Relative: 10 % (ref 3–12)
Neutro Abs: 3.5 10*3/uL (ref 1.7–7.7)
Neutrophils Relative %: 67 % (ref 43–77)
Platelets: 266 10*3/uL (ref 150–400)
RBC: 3.85 MIL/uL — AB (ref 4.22–5.81)
RDW: 13.8 % (ref 11.5–15.5)
WBC: 5.3 10*3/uL (ref 4.0–10.5)

## 2014-06-12 LAB — COMPREHENSIVE METABOLIC PANEL
ALBUMIN: 3.4 g/dL — AB (ref 3.5–5.2)
ALT: 11 U/L (ref 0–53)
AST: 14 U/L (ref 0–37)
Alkaline Phosphatase: 54 U/L (ref 39–117)
Anion gap: 7 (ref 5–15)
BUN: 12 mg/dL (ref 6–23)
CO2: 28 mmol/L (ref 19–32)
CREATININE: 0.79 mg/dL (ref 0.50–1.35)
Calcium: 9.1 mg/dL (ref 8.4–10.5)
Chloride: 102 mmol/L (ref 96–112)
Glucose, Bld: 141 mg/dL — ABNORMAL HIGH (ref 70–99)
Potassium: 3.9 mmol/L (ref 3.5–5.1)
Sodium: 137 mmol/L (ref 135–145)
Total Bilirubin: 0.4 mg/dL (ref 0.3–1.2)
Total Protein: 7.2 g/dL (ref 6.0–8.3)

## 2014-06-12 NOTE — Progress Notes (Signed)
LABS DRAWN

## 2014-06-12 NOTE — Patient Instructions (Addendum)
[place] at [facility] Discharge Instructions  RECOMMENDATIONS MADE BY THE CONSULTANT AND ANY TEST RESULTS WILL BE SENT TO YOUR REFERRING PHYSICIAN.  Exam and discussion by Dr. [name].  Referrals to be made:  Nutritional Consultation - Our dietitian will contact you to schedule an appointment  Port a Catheter placement by Interventional Radiology :   Nothing to eat or drink after midnight tonight,    you will need a driver and will need to be at [facility] Radiology tomorrow morning    at 9:30 am.   Consultation to Radiation Oncology at [facility] - They will call you with your   Appointment   Chemotherapy Teaching with Lupita Raider, our nurse navigator - She will call you with an appointment   for teaching.  You will be treated with weekly Carboplatin and Taxol in conjunction with Radiation Therapy. Dr. [name] is waiting to hear back from Dr. Ardis Hughs   Follow-up in 2 weeks to see Dr. [name].  Thank you for choosing [place] at [facility] to provide your oncology and hematology care.  To afford each patient quality time with our provider, please arrive at least 15 minutes before your scheduled appointment time.    You need to re-schedule your appointment should you arrive 10 or more minutes late.  We strive to give you quality time with our providers, and arriving late affects you and other patients whose appointments are after yours.  Also, if you no show three or more times for appointments you may be dismissed from the clinic at the providers discretion.     Again, thank you for choosing [facility].  Our hope is that these requests will decrease the amount of time that you wait before being seen by our physicians.       _____________________________________________________________  Should you have questions after your visit to [facility], please contact our office at [phone]  between the hours of 8:30 a.m. and 4:30 p.m.  Voicemails left after 4:30 p.m. will not be returned until the following business day.  For prescription refill requests, have your pharmacy contact our office.   Implanted [facility] Guide An implanted port is a type of central line that is placed under the skin. Central lines are used to provide IV access when treatment or nutrition needs to be given through a person's veins. Implanted ports are used for long-term IV access. An implanted port may be placed because:   You need IV medicine that would be irritating to the small veins in your hands or arms.   You need long-term IV medicines, such as antibiotics.   You need IV nutrition for a long period.   You need frequent blood draws for lab tests.   You need dialysis.  Implanted ports are usually placed in the chest area, but they can also be placed in the upper arm, the abdomen, or the leg. An implanted port has two main parts:   Reservoir. The reservoir is round and will appear as a small, raised area under your skin. The reservoir is the part where a needle is inserted to give medicines or draw blood.   Catheter. The catheter is a thin, flexible tube that extends from the reservoir. The catheter is placed into a large vein. Medicine that is inserted into the reservoir goes into the catheter and then into the vein.  HOW WILL I CARE FOR MY INCISION  SITE? Do not get the incision site wet. Bathe or shower as directed by your health care provider.  HOW IS MY PORT ACCESSED? Special steps must be taken to access the port:   Before the port is accessed, a numbing cream can be placed on the skin. This helps numb the skin over the port site.   Your health care provider uses a sterile technique to access the port.  Your health care provider must put on a mask and sterile gloves.  The skin over your port is cleaned carefully with an antiseptic and allowed to dry.  The port is gently pinched  between sterile gloves, and a needle is inserted into the port.  Only "non-coring" port needles should be used to access the port. Once the port is accessed, a blood return should be checked. This helps ensure that the port is in the vein and is not clogged.   If your port needs to remain accessed for a constant infusion, a clear (transparent) bandage will be placed over the needle site. The bandage and needle will need to be changed every week, or as directed by your health care provider.   Keep the bandage covering the needle clean and dry. Do not get it wet. Follow your health care provider's instructions on how to take a shower or bath while the port is accessed.   If your port does not need to stay accessed, no bandage is needed over the port.  WHAT IS FLUSHING? Flushing helps keep the port from getting clogged. Follow your health care provider's instructions on how and when to flush the port. Ports are usually flushed with saline solution or a medicine called heparin. The need for flushing will depend on how the port is used.   If the port is used for intermittent medicines or blood draws, the port will need to be flushed:   After medicines have been given.   After blood has been drawn.   As part of routine maintenance.   If a constant infusion is running, the port may not need to be flushed.  HOW LONG WILL MY PORT STAY IMPLANTED? The port can stay in for as long as your health care provider thinks it is needed. When it is time for the port to come out, surgery will be done to remove it. The procedure is similar to the one performed when the port was put in.  WHEN SHOULD I SEEK IMMEDIATE MEDICAL CARE? When you have an implanted port, you should seek immediate medical care if:   You notice a bad smell coming from the incision site.   You have swelling, redness, or drainage at the incision site.   You have more swelling or pain at the port site or the surrounding area.    You have a fever that is not controlled with medicine. Document Released: 02/01/2005 Document Revised: 11/22/2012 Document Reviewed: 10/09/2012 [facility] Patient Information 2015 [place], [place]. This information is not intended to replace advice given to you by your health care provider. Make sure you discuss any questions you have with your health care provider. Carboplatin injection What is this medicine? CARBOPLATIN (KAR boe pla tin) is a chemotherapy drug. It targets fast dividing cells, like cancer cells, and causes these cells to die. This medicine is used to treat ovarian cancer and many other cancers. This medicine may be used for other purposes; ask your health care provider or pharmacist if you have questions. COMMON BRAND NAME(S): Paraplatin What should I  tell my health care provider before I take this medicine? They need to know if you have any of these conditions: -blood disorders -hearing problems -kidney disease -recent or ongoing radiation therapy -an unusual or allergic reaction to carboplatin, cisplatin, other chemotherapy, other medicines, foods, dyes, or preservatives -pregnant or trying to get pregnant -breast-feeding How should I use this medicine? This drug is usually given as an infusion into a vein. It is administered in a hospital or clinic by a specially trained health care professional. Talk to your pediatrician regarding the use of this medicine in children. Special care may be needed. Overdosage: If you think you have taken too much of this medicine contact a poison control center or emergency room at once. NOTE: This medicine is only for you. Do not share this medicine with others. What if I miss a dose? It is important not to miss a dose. Call your doctor or health care professional if you are unable to keep an appointment. What may interact with this medicine? -medicines for seizures -medicines to increase blood counts like filgrastim, pegfilgrastim,  sargramostim -some antibiotics like amikacin, gentamicin, neomycin, streptomycin, tobramycin -vaccines Talk to your doctor or health care professional before taking any of these medicines: -acetaminophen -aspirin -ibuprofen -ketoprofen -naproxen This list may not describe all possible interactions. Give your health care provider a list of all the medicines, herbs, non-prescription drugs, or dietary supplements you use. Also tell them if you smoke, drink alcohol, or use illegal drugs. Some items may interact with your medicine. What should I watch for while using this medicine? Your condition will be monitored carefully while you are receiving this medicine. You will need important blood work done while you are taking this medicine. This drug may make you feel generally unwell. This is not uncommon, as chemotherapy can affect healthy cells as well as cancer cells. Report any side effects. Continue your course of treatment even though you feel ill unless your doctor tells you to stop. In some cases, you may be given additional medicines to help with side effects. Follow all directions for their use. Call your doctor or health care professional for advice if you get a fever, chills or sore throat, or other symptoms of a cold or flu. Do not treat yourself. This drug decreases your body's ability to fight infections. Try to avoid being around people who are sick. This medicine may increase your risk to bruise or bleed. Call your doctor or health care professional if you notice any unusual bleeding. Be careful brushing and flossing your teeth or using a toothpick because you may get an infection or bleed more easily. If you have any dental work done, tell your dentist you are receiving this medicine. Avoid taking products that contain aspirin, acetaminophen, ibuprofen, naproxen, or ketoprofen unless instructed by your doctor. These medicines may hide a fever. Do not become pregnant while taking this  medicine. Women should inform their doctor if they wish to become pregnant or think they might be pregnant. There is a potential for serious side effects to an unborn child. Talk to your health care professional or pharmacist for more information. Do not breast-feed an infant while taking this medicine. What side effects may I notice from receiving this medicine? Side effects that you should report to your doctor or health care professional as soon as possible: -allergic reactions like skin rash, itching or hives, swelling of the face, lips, or tongue -signs of infection - fever or chills, cough, sore throat, pain  or difficulty passing urine -signs of decreased platelets or bleeding - bruising, pinpoint red spots on the skin, black, tarry stools, nosebleeds -signs of decreased red blood cells - unusually weak or tired, fainting spells, lightheadedness -breathing problems -changes in hearing -changes in vision -chest pain -high blood pressure -low blood counts - This drug may decrease the number of white blood cells, red blood cells and platelets. You may be at increased risk for infections and bleeding. -nausea and vomiting -pain, swelling, redness or irritation at the injection site -pain, tingling, numbness in the hands or feet -problems with balance, talking, walking -trouble passing urine or change in the amount of urine Side effects that usually do not require medical attention (report to your doctor or health care professional if they continue or are bothersome): -hair loss -loss of appetite -metallic taste in the mouth or changes in taste This list may not describe all possible side effects. Call your doctor for medical advice about side effects. You may report side effects to FDA at 1-800-FDA-1088. Where should I keep my medicine? This drug is given in a hospital or clinic and will not be stored at home. NOTE: This sheet is a summary. It may not cover all possible information. If you  have questions about this medicine, talk to your doctor, pharmacist, or health care provider.  2015, [employer] Standard. (2007-05-09 14:38:05) Paclitaxel injection What is this medicine? PACLITAXEL (PAK li TAX el) is a chemotherapy drug. It targets fast dividing cells, like cancer cells, and causes these cells to die. This medicine is used to treat ovarian cancer, breast cancer, and other cancers. This medicine may be used for other purposes; ask your health care provider or pharmacist if you have questions. COMMON BRAND NAME(S): Onxol, Taxol What should I tell my health care provider before I take this medicine? They need to know if you have any of these conditions: -blood disorders -irregular heartbeat -infection (especially a virus infection such as chickenpox, cold sores, or herpes) -liver disease -previous or ongoing radiation therapy -an unusual or allergic reaction to paclitaxel, alcohol, polyoxyethylated castor oil, other chemotherapy agents, other medicines, foods, dyes, or preservatives -pregnant or trying to get pregnant -breast-feeding How should I use this medicine? This drug is given as an infusion into a vein. It is administered in a hospital or clinic by a specially trained health care professional. Talk to your pediatrician regarding the use of this medicine in children. Special care may be needed. Overdosage: If you think you have taken too much of this medicine contact a poison control center or emergency room at once. NOTE: This medicine is only for you. Do not share this medicine with others. What if I miss a dose? It is important not to miss your dose. Call your doctor or health care professional if you are unable to keep an appointment. What may interact with this medicine? Do not take this medicine with any of the following medications: -disulfiram -metronidazole This medicine may also interact with the following  medications: -cyclosporine -diazepam -ketoconazole -medicines to increase blood counts like filgrastim, pegfilgrastim, sargramostim -other chemotherapy drugs like cisplatin, doxorubicin, epirubicin, etoposide, teniposide, vincristine -quinidine -testosterone -vaccines -verapamil Talk to your doctor or health care professional before taking any of these medicines: -acetaminophen -aspirin -ibuprofen -ketoprofen -naproxen This list may not describe all possible interactions. Give your health care provider a list of all the medicines, herbs, non-prescription drugs, or dietary supplements you use. Also tell them if you smoke, drink alcohol, or use illegal  drugs. Some items may interact with your medicine. What should I watch for while using this medicine? Your condition will be monitored carefully while you are receiving this medicine. You will need important blood work done while you are taking this medicine. This drug may make you feel generally unwell. This is not uncommon, as chemotherapy can affect healthy cells as well as cancer cells. Report any side effects. Continue your course of treatment even though you feel ill unless your doctor tells you to stop. In some cases, you may be given additional medicines to help with side effects. Follow all directions for their use. Call your doctor or health care professional for advice if you get a fever, chills or sore throat, or other symptoms of a cold or flu. Do not treat yourself. This drug decreases your body's ability to fight infections. Try to avoid being around people who are sick. This medicine may increase your risk to bruise or bleed. Call your doctor or health care professional if you notice any unusual bleeding. Be careful brushing and flossing your teeth or using a toothpick because you may get an infection or bleed more easily. If you have any dental work done, tell your dentist you are receiving this medicine. Avoid taking products  that contain aspirin, acetaminophen, ibuprofen, naproxen, or ketoprofen unless instructed by your doctor. These medicines may hide a fever. Do not become pregnant while taking this medicine. Women should inform their doctor if they wish to become pregnant or think they might be pregnant. There is a potential for serious side effects to an unborn child. Talk to your health care professional or pharmacist for more information. Do not breast-feed an infant while taking this medicine. Men are advised not to father a child while receiving this medicine. What side effects may I notice from receiving this medicine? Side effects that you should report to your doctor or health care professional as soon as possible: -allergic reactions like skin rash, itching or hives, swelling of the face, lips, or tongue -low blood counts - This drug may decrease the number of white blood cells, red blood cells and platelets. You may be at increased risk for infections and bleeding. -signs of infection - fever or chills, cough, sore throat, pain or difficulty passing urine -signs of decreased platelets or bleeding - bruising, pinpoint red spots on the skin, black, tarry stools, nosebleeds -signs of decreased red blood cells - unusually weak or tired, fainting spells, lightheadedness -breathing problems -chest pain -high or low blood pressure -mouth sores -nausea and vomiting -pain, swelling, redness or irritation at the injection site -pain, tingling, numbness in the hands or feet -slow or irregular heartbeat -swelling of the ankle, feet, hands Side effects that usually do not require medical attention (report to your doctor or health care professional if they continue or are bothersome): -bone pain -complete hair loss including hair on your head, underarms, pubic hair, eyebrows, and eyelashes -changes in the color of fingernails -diarrhea -loosening of the fingernails -loss of appetite -muscle or joint pain -red  flush to skin -sweating This list may not describe all possible side effects. Call your doctor for medical advice about side effects. You may report side effects to FDA at 1-800-FDA-1088. Where should I keep my medicine? This drug is given in a hospital or clinic and will not be stored at home. NOTE: This sheet is a summary. It may not cover all possible information. If you have questions about this medicine, talk to your doctor,  pharmacist, or health care provider.  2015, [employer] Standard. (2012-03-27 16:41:21)
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
## 2014-06-12 NOTE — Telephone Encounter (Signed)
-----   Message from [name], MD sent at 06/12/2014 10:20 AM EDT ----- Donalee Citrin set it all up.  May be able to do it tomorrow for him.  I'll let you know what I find.  Thanks   Kyrie Fludd, He needs upper EUS, radial +/- linear, +MAC, for staging of esophageal cancer, (tomorrow at [facility] add on to end of day, hopefully).  Thanks  ----- Message -----    From: [name], MD    Sent: 06/12/2014  10:13 AM      To: [name], MD  That would be great. He is physically quite fit. Do you want [place] to refer or will you all schedule and let us know? Larene [place]   ----- Message -----    From: [name], MD    Sent: 06/12/2014   9:38 AM      To: [name], MD  Just looked at the PET results.  I'm happy to proceed with EUS if you are not sure about the +LN suggested by PET.  I could get another look at it, possibly even FNA it if it doesn't require passing through the primary site to get to.   Let me know, Thanks   ----- Message -----    From: [name], MD    Sent: 06/12/2014   8:06 AM      To: [name], MD  Jacqulyn Cane,  Didn't know if you saw Mr. Mcinnis's PET. Lung nodules are not hypermetabolic, some activity in axillary LN and inguinal LN, not felt to be malignant.  He has metastatic gastrohepatic ligament LAD.   Larene Beach

## 2014-06-12 NOTE — Telephone Encounter (Signed)
EUS scheduled, pt instructed and medications reviewed.  Patient instructions mailed to home.  Patient to call with any questions or concerns.  

## 2014-06-12 NOTE — Progress Notes (Signed)
[facility] Clinical Social Work  Clinical Social Work was referred by nurse for assessment of psychosocial needs due to new patient starting treatment soon.  Clinical Social Worker met with patient and sister, Agustin Cree at [facility] to offer support and assess for needs. CSW introduced self and explained role of CSW.  Pt reports to live with his aunt. He is not currently working, but has worked in the past. He reports he has applied for ss disability, but was denied. He is trying to appeal this currently. CSW educated patient on how to apply for medicaid at social services in [place]. He currently has the discount card through [place]. 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 Sister can assist with transportation as needed. Pt and sister agree to reach out to CSW as needed. They denied other concerns today. Pt appears to have a good support system, but may need resource assistance during treatment. Pt and sister agree to reach out as needed and were provided with CSW contact information today.   Clinical Social Work interventions: Resource education Supportive listening  Loren Racer, [place] Tuesdays 8:30-1pm Wednesdays 8:30-12pm  [phone]

## 2014-06-13 ENCOUNTER — Encounter (HOSPITAL_COMMUNITY): Payer: Self-pay | Admitting: *Deleted

## 2014-06-13 ENCOUNTER — Other Ambulatory Visit (HOSPITAL_COMMUNITY): Payer: Self-pay

## 2014-06-13 ENCOUNTER — Ambulatory Visit (HOSPITAL_COMMUNITY): Payer: Self-pay

## 2014-06-13 ENCOUNTER — Ambulatory Visit (HOSPITAL_COMMUNITY)
Admission: RE | Admit: 2014-06-13 | Discharge: 2014-06-13 | Disposition: A | Discharge status: Home / Self Care | Payer: Medicaid Other | Source: Ambulatory Visit | Attending: Gastroenterology | Admitting: Gastroenterology

## 2014-06-13 ENCOUNTER — Ambulatory Visit (HOSPITAL_COMMUNITY): Payer: Medicaid Other | Admitting: Anesthesiology

## 2014-06-13 ENCOUNTER — Other Ambulatory Visit: Payer: Self-pay | Admitting: Radiology

## 2014-06-13 ENCOUNTER — Encounter (HOSPITAL_COMMUNITY)
Admission: RE | Disposition: A | Discharge status: Home / Self Care | Payer: Self-pay | Source: Ambulatory Visit | Attending: Gastroenterology

## 2014-06-13 DIAGNOSIS — D649 Anemia, unspecified: Secondary | ICD-10-CM | POA: Diagnosis not present

## 2014-06-13 DIAGNOSIS — C159 Malignant neoplasm of esophagus, unspecified: Secondary | ICD-10-CM

## 2014-06-13 DIAGNOSIS — K219 Gastro-esophageal reflux disease without esophagitis: Secondary | ICD-10-CM | POA: Insufficient documentation

## 2014-06-13 DIAGNOSIS — Z7982 Long term (current) use of aspirin: Secondary | ICD-10-CM | POA: Diagnosis not present

## 2014-06-13 DIAGNOSIS — C160 Malignant neoplasm of cardia: Secondary | ICD-10-CM | POA: Diagnosis not present

## 2014-06-13 DIAGNOSIS — R070 Pain in throat: Secondary | ICD-10-CM | POA: Diagnosis present

## 2014-06-13 DIAGNOSIS — M109 Gout, unspecified: Secondary | ICD-10-CM | POA: Insufficient documentation

## 2014-06-13 HISTORY — DX: Abnormal results of pulmonary function studies: R94.2

## 2014-06-13 HISTORY — PX: EUS: SHX5427

## 2014-06-13 SURGERY — UPPER ENDOSCOPIC ULTRASOUND (EUS) LINEAR
Anesthesia: Monitor Anesthesia Care

## 2014-06-13 MED ORDER — LIDOCAINE HCL (CARDIAC) 20 MG/ML IV SOLN
INTRAVENOUS | Status: AC
  Filled 2014-06-13: qty 5

## 2014-06-13 MED ORDER — PROPOFOL 10 MG/ML IV BOLUS
INTRAVENOUS | Status: DC | PRN
  Administered 2014-06-13: 50 mg via INTRAVENOUS
  Administered 2014-06-13: 25 mg via INTRAVENOUS
  Administered 2014-06-13 (×2): 50 mg via INTRAVENOUS
  Administered 2014-06-13: 25 mg via INTRAVENOUS
  Administered 2014-06-13: 75 mg via INTRAVENOUS
  Administered 2014-06-13: 25 mg via INTRAVENOUS

## 2014-06-13 MED ORDER — PROPOFOL 10 MG/ML IV BOLUS
INTRAVENOUS | Status: AC
  Filled 2014-06-13: qty 20

## 2014-06-13 MED ORDER — LIDOCAINE HCL (CARDIAC) 20 MG/ML IV SOLN
INTRAVENOUS | Status: DC | PRN
  Administered 2014-06-13: 100 mg via INTRAVENOUS

## 2014-06-13 MED ORDER — LACTATED RINGERS IV SOLN
INTRAVENOUS | Status: DC
  Administered 2014-06-13: 11:00:00 via INTRAVENOUS
  Administered 2014-06-13: 1000 mL via INTRAVENOUS

## 2014-06-13 MED ORDER — SODIUM CHLORIDE 0.9 % IV SOLN: INTRAVENOUS | Status: DC

## 2014-06-13 NOTE — Discharge Instructions (Signed)

## 2014-06-13 NOTE — Op Note (Signed)
[facility] [place], [address]   ENDOSCOPIC ULTRASOUND PROCEDURE REPORT  PATIENT: Anthony, Cameron  MR#: [id number] BIRTHDATE: 1950-10-17  GENDER: male ENDOSCOPIST: [name], MD REFERRED BY:  Barney Drain, M.D. PROCEDURE DATE:  06/13/2014 PROCEDURE:   Upper EUS w/FNA ASA CLASS:      Class III INDICATIONS:   recently diagnosed distal esophagus squamous cell cancer (EGD Dr.  [name]), PET scan suggests malignant gastrohepatic adenopathy. MEDICATIONS: Monitored anesthesia care  DESCRIPTION OF PROCEDURE:   After the risks benefits and alternatives of the procedure were  explained, informed consent was obtained. The patient was then placed in the left, lateral, decubitus postion and IV sedation was administered. Throughout the procedure, the patients blood pressure, pulse and oxygen saturations were monitored continuously.  Under direct visualization, the Pentax EUS Radial [id number]  endoscope was introduced through the mouth  and advanced to the stomach body . Water was used as necessary to provide an acoustic interface.  Upon completion of the imaging, water was removed and the patient was sent to the recovery room in satisfactory condition.  Endoscopic findings: 1. Long, non-circumferential, clearly malignant mass in distal esophagus. The mass is 8cm long (proximal edge at 37cm, distal edge at 45cm from incisors) and it spans the GE junction (located at 40cm).  EUS findings: 1. The mass above correlates with a hypoechoic lesion that clearly passes into and through the muscularis propria layer of the esophageal wall (uT3). 2. There is no paraesophageal adenopathy. 3. There was a large, suspicious appearing, gastroehpatic ligamant lymphnode (3.5cm maximum dimension)  that lays very close to the distal edge of the mass.  The lymphnode was sampled with two transgastric passes with a 22 gauge EUS FNA needle (uN1). 4. Limited views of liver,  spleen, pancreas were all normal.  ENDOSCOPIC IMPRESSION: uT3N1 (Stage IIIA) 8cm long non-circumferential GE junction malignancy (squamous cell cancer on recent pathology).  Preliminary cytology review of the gastroehpatic lymphnode was positive for malignancy, await final reading. He will likely benefit from neoadjuvant chemoXRT.   _______________________________ eSignedMilus Banister, MD 06/13/2014 12:17 PM   CC: Ancil Linsey, MD

## 2014-06-13 NOTE — Transfer of Care (Signed)
Immediate Anesthesia Transfer of Care Note  Patient: Anthony Cameron  Procedure(s) Performed: Procedure(s): UPPER ENDOSCOPIC ULTRASOUND (EUS) LINEAR (N/A)  Patient Location: PACU and Endoscopy Unit  Anesthesia Type:MAC  Level of Consciousness: awake, sedated and patient cooperative  Airway & Oxygen Therapy: Patient Spontanous Breathing and Patient connected to nasal cannula oxygen  Post-op Assessment: Report given to RN and Post -op Vital signs reviewed and stable  Post vital signs: Reviewed and stable  Last Vitals:  Filed Vitals:   06/13/14 1112  BP: 99/56  Temp: 97.5 C    Complications: No apparent anesthesia complications

## 2014-06-13 NOTE — Interval H&P Note (Signed)
History and Physical Interval Note:  06/13/2014 11:21 AM  Anthony Cameron  has presented today for surgery, with the diagnosis of esophageal cancer  The various methods of treatment have been discussed with the patient and family. After consideration of risks, benefits and other options for treatment, the patient has consented to  Procedure(s): UPPER ENDOSCOPIC ULTRASOUND (EUS) LINEAR (N/A) as a surgical intervention .  The patient's history has been reviewed, patient examined, no change in status, stable for surgery.  I have reviewed the patient's chart and labs.  Questions were answered to the patient's satisfaction.     [name]
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
## 2014-06-13 NOTE — Anesthesia Postprocedure Evaluation (Signed)
  Anesthesia Post-op Note  Patient: Anthony Cameron  Procedure(s) Performed: Procedure(s) (LRB): UPPER ENDOSCOPIC ULTRASOUND (EUS) LINEAR (N/A)  Patient Location: PACU  Anesthesia Type: MAC  Level of Consciousness: awake and alert   Airway and Oxygen Therapy: Patient Spontanous Breathing  Post-op Pain: mild  Post-op Assessment: Post-op Vital signs reviewed, Patient's Cardiovascular Status Stable, Respiratory Function Stable, Patent Airway and No signs of Nausea or vomiting  Last Vitals:  Filed Vitals:   06/13/14 1202  BP: 104/52  Pulse:   Temp:   Resp:     Post-op Vital Signs: stable   Complications: No apparent anesthesia complications

## 2014-06-13 NOTE — Anesthesia Preprocedure Evaluation (Signed)
Anesthesia Evaluation  Patient identified by MRN, date of birth, ID band Patient awake    Reviewed: Allergy & Precautions, NPO status , Patient's Chart, lab work & pertinent test results  Airway Mallampati: II  TM Distance: >3 FB Neck ROM: Full    Dental no notable dental hx. (+) Lower Dentures, Upper Dentures   Pulmonary neg pulmonary ROS, former smoker,  breath sounds clear to auscultation  Pulmonary exam normal       Cardiovascular hypertension, negative cardio ROS  Rhythm:Regular Rate:Normal     Neuro/Psych negative neurological ROS  negative psych ROS   GI/Hepatic negative GI ROS, Neg liver ROS,   Endo/Other  negative endocrine ROS  Renal/GU negative Renal ROS  negative genitourinary   Musculoskeletal negative musculoskeletal ROS (+)   Abdominal   Peds negative pediatric ROS (+)  Hematology negative hematology ROS (+)   Anesthesia Other Findings   Reproductive/Obstetrics negative OB ROS                             Anesthesia Physical Anesthesia Plan  ASA: III  Anesthesia Plan: MAC   Post-op Pain Management:    Induction:   Airway Management Planned: Nasal Cannula  Additional Equipment:   Intra-op Plan:   Post-operative Plan:   Informed Consent: I have reviewed the patients History and Physical, chart, labs and discussed the procedure including the risks, benefits and alternatives for the proposed anesthesia with the patient or authorized representative who has indicated his/her understanding and acceptance.   Dental advisory given  Plan Discussed with: CRNA  Anesthesia Plan Comments:         Anesthesia Quick Evaluation

## 2014-06-14 ENCOUNTER — Encounter (HOSPITAL_COMMUNITY): Payer: Self-pay | Admitting: Lab

## 2014-06-14 ENCOUNTER — Encounter (HOSPITAL_COMMUNITY): Payer: Self-pay | Admitting: Gastroenterology

## 2014-06-14 ENCOUNTER — Encounter: Payer: Self-pay | Admitting: Dietician

## 2014-06-14 DIAGNOSIS — C159 Malignant neoplasm of esophagus, unspecified: Secondary | ICD-10-CM | POA: Insufficient documentation

## 2014-06-14 MED ORDER — PROCHLORPERAZINE MALEATE 10 MG PO TABS: 10.0000 mg | ORAL_TABLET | Freq: Four times a day (QID) | ORAL | Status: DC | PRN

## 2014-06-14 MED ORDER — ONDANSETRON HCL 8 MG PO TABS: 8.0000 mg | ORAL_TABLET | Freq: Three times a day (TID) | ORAL | Status: DC | PRN

## 2014-06-14 MED ORDER — LIDOCAINE-PRILOCAINE 2.5-2.5 % EX CREA: TOPICAL_CREAM | CUTANEOUS | Status: DC

## 2014-06-14 NOTE — Progress Notes (Signed)
Referral sent to [facility]. Records faxed on 4/29

## 2014-06-14 NOTE — Patient Instructions (Signed)
[facility]   CHEMOTHERAPY INSTRUCTIONS  Premeds: Zofran - for nausea, Dexamethasone - steroid - this is being given to reduce the risk of you having an allergic reaction to the Taxol chemo, Benadryl - antihistamine - being given to prevent an allergic type reaction to the Taxol chemo, Pepcid - antihistamine - being given to prevent an allergic reaction to the Taxol chemo. (Takes 1 hour to infuse)  Taxol - the first time you receive this drug we will titrate it very slowly to ensure that you do not have or are not having an allergic reaction to the chemo. Side Effects: hair loss, lowers your white blood cells (fight infection), muscle aches, nausea/vomiting, irritation to the mouth (mouth sores, pain in your mouth) *neuropathy - numbness/tingling/burning in hands/fingers/feet/toes. We need to know as soon as this begins to happen so that we can monitor it and treat if necessary. The numbness generally begins in the fingertips of tips of toes and then begins to travel up the finger/toe/hand/foot. We never want you getting to where you can't pick up a pen, coin, zip a zipper, button a button, or have trouble walking. You must tell us immediately if you are experiencing peripheral neuropathy! (Takes 3 hours to infuse the first time and then 1 hour after that)  Carboplatin - this medication can be hard on your kidneys - this is why we need you to drink 64 oz of fluid (preferably water/decaff fluids) 2 days prior to chemo and for up to 4-5 days after chemo. Drink more if you can. This will help to keep your kidneys flushed. This can cause mild hair loss, lower your platelets (which make your blood clot), lower your white blood cells (fight infection), and cause nausea/vomiting. (Takes 30 minutes to infuse)    POTENTIAL SIDE EFFECTS OF TREATMENT: Increased Susceptibility to Infection, Vomiting, Constipation, Hair Thinning, Changes in Character of Skin and Nails  (brittleness, dryness,etc.), Pigment Changes (darkening of veins, nail beds, palms of hands, soles of feet, etc.), Bone Marrow Suppression, Abdominal Cramping, Complete Hair Loss, Nausea, Diarrhea and Mouth Sores   SELF IMAGE NEEDS AND REFERRALS MADE: Obtain hair accessories as soon as possible (wigs, scarves, turbans,caps,etc.)   EDUCATIONAL MATERIALS GIVEN AND REVIEWED: Chemotherapy and You Booklet Specific Instructions Sheets: Taxol, Carboplatin, Zofran, Dexamethasone, Benadryl, Pepcid, EMLA cream, Compazine   SELF CARE ACTIVITIES WHILE ON CHEMOTHERAPY: Increase your fluid intake 48 hours prior to treatment and drink at least 2 quarts per day after treatment., No alcohol intake., No aspirin or other medications unless approved by your oncologist., Eat foods that are light and easy to digest., Eat foods at cold or room temperature., No fried, fatty, or spicy foods immediately before or after treatment., Have teeth cleaned professionally before starting treatment. Keep dentures and partial plates clean., Use soft toothbrush and do not use mouthwashes that contain alcohol. Biotene is a good mouthwash that is available at most pharmacies or may be ordered by calling [phone]., Use warm salt water gargles (1 teaspoon salt per 1 quart warm water) before and after meals and at bedtime. Or you may rinse with 2 tablespoons of three -percent hydrogen peroxide mixed in eight ounces of water., Always use sunscreen with SPF (Sun Protection Factor) of 30 or higher., Use your nausea medication as directed to prevent nausea., Use your stool softener or laxative as directed to prevent constipation. and Use your anti-diarrheal medication as directed to stop diarrhea.  Please wash your hands for at least  30 seconds using warm soapy water. Handwashing is the #1 way to prevent the spread of germs. Stay away from sick people or people who are getting over a cold. If you develop respiratory systems such as  green/yellow mucus production or productive cough or persistent cough let us know and we will see if you need an antibiotic. It is a good idea to keep a pair of gloves on when going into grocery stores/[employer] to decrease your risk of coming into contact with germs on the carts, etc. Carry alcohol hand gel with you at all times and use it frequently if out in public. All foods need to be cooked thoroughly. No raw foods. No medium or undercooked meats, eggs. If your food is cooked medium well, it does not need to be hot pink or saturated with bloody liquid at all. Vegetables and fruits need to be washed/rinsed under the faucet with a dish detergent before being consumed. You can eat raw fruits and vegetables unless we tell you otherwise but it would be best if you cooked them or bought frozen. Do not eat off of salad bars or hot bars unless you really trust the cleanliness of the restaurant. If you need dental work, please let Dr. [name] know before you go for your appointment so that we can coordinate the best possible time for you in regards to your chemo regimen. You need to also let your dentist know that you are actively taking chemo. We may need to do labs prior to your dental appointment. We also want your bowels moving at least every other day. If this is not happening, we need to know so that we can get you on a bowel regimen to help you go.     MEDICATIONS: You have been given prescriptions for the following medications:  Zofran/Ondansetron '8mg'$  tablet. Take 1 tablet every 8 hours as needed for nausea/vomiting.   Compazine/Prochlorperazine '10mg'$ . Take 1 tablet every 6 hours as needed for nausea/vomiting.   EMLA cream. Apply a quarter size amount to port site 1 hour prior to chemo. Do not rub in. Cover with plastic wrap.   Over-the-Counter Meds:  Senna - this is a mild laxative used to treat mild constipation. May take 1-8 tabs by mouth daily as needed for mild constipation.  Milk of  Magnesia - this is a laxative used to treat moderate to severe constipation. May take 2-4 tablespoons every 8 hours as needed. May increase to 8 tablespoons x 1 dose and if no bowel movement call the [place].  Imodium - this is for diarrhea. Take 2 tabs after 1st loose stool and then 1 tab every 2 hours until you go a total of 12 hours without a loose stool. Call [facility] if loose stools continue.       SYMPTOMS TO REPORT AS SOON AS POSSIBLE AFTER TREATMENT:  FEVER GREATER THAN 100.5 F  CHILLS WITH OR WITHOUT FEVER  NAUSEA AND VOMITING THAT IS NOT CONTROLLED WITH YOUR NAUSEA MEDICATION  UNUSUAL SHORTNESS OF BREATH  UNUSUAL BRUISING OR BLEEDING  TENDERNESS IN MOUTH AND THROAT WITH OR WITHOUT PRESENCE OF ULCERS  URINARY PROBLEMS  BOWEL PROBLEMS  UNUSUAL RASH    Wear comfortable clothing and clothing appropriate for easy access to any Portacath or PICC line. Let us know if there is anything that we can do to make your therapy better!      I have been informed and understand all of the instructions given to me and have  received a copy. I have been instructed to call the clinic [phone] or my family physician as soon as possible for continued medical care, if indicated. I do not have any more questions at this time but understand that I may call the [facility] or the Patient Navigator at [phone] during office hours should I have questions or need assistance in obtaining follow-up care.           Carboplatin injection What is this medicine? CARBOPLATIN (KAR boe pla tin) is a chemotherapy drug. It targets fast dividing cells, like cancer cells, and causes these cells to die. This medicine is used to treat ovarian cancer and many other cancers. This medicine may be used for other purposes; ask your health care provider or pharmacist if you have questions. COMMON BRAND NAME(S): Paraplatin What should I tell my health care provider before I take  this medicine? They need to know if you have any of these conditions: -blood disorders -hearing problems -kidney disease -recent or ongoing radiation therapy -an unusual or allergic reaction to carboplatin, cisplatin, other chemotherapy, other medicines, foods, dyes, or preservatives -pregnant or trying to get pregnant -breast-feeding How should I use this medicine? This drug is usually given as an infusion into a vein. It is administered in a hospital or clinic by a specially trained health care professional. Talk to your pediatrician regarding the use of this medicine in children. Special care may be needed. Overdosage: If you think you have taken too much of this medicine contact a poison control center or emergency room at once. NOTE: This medicine is only for you. Do not share this medicine with others. What if I miss a dose? It is important not to miss a dose. Call your doctor or health care professional if you are unable to keep an appointment. What may interact with this medicine? -medicines for seizures -medicines to increase blood counts like filgrastim, pegfilgrastim, sargramostim -some antibiotics like amikacin, gentamicin, neomycin, streptomycin, tobramycin -vaccines Talk to your doctor or health care professional before taking any of these medicines: -acetaminophen -aspirin -ibuprofen -ketoprofen -naproxen This list may not describe all possible interactions. Give your health care provider a list of all the medicines, herbs, non-prescription drugs, or dietary supplements you use. Also tell them if you smoke, drink alcohol, or use illegal drugs. Some items may interact with your medicine. What should I watch for while using this medicine? Your condition will be monitored carefully while you are receiving this medicine. You will need important blood work done while you are taking this medicine. This drug may make you feel generally unwell. This is not uncommon, as  chemotherapy can affect healthy cells as well as cancer cells. Report any side effects. Continue your course of treatment even though you feel ill unless your doctor tells you to stop. In some cases, you may be given additional medicines to help with side effects. Follow all directions for their use. Call your doctor or health care professional for advice if you get a fever, chills or sore throat, or other symptoms of a cold or flu. Do not treat yourself. This drug decreases your body's ability to fight infections. Try to avoid being around people who are sick. This medicine may increase your risk to bruise or bleed. Call your doctor or health care professional if you notice any unusual bleeding. Be careful brushing and flossing your teeth or using a toothpick because you may get an infection or bleed more easily. If you have  any dental work done, tell your dentist you are receiving this medicine. Avoid taking products that contain aspirin, acetaminophen, ibuprofen, naproxen, or ketoprofen unless instructed by your doctor. These medicines may hide a fever. Do not become pregnant while taking this medicine. Women should inform their doctor if they wish to become pregnant or think they might be pregnant. There is a potential for serious side effects to an unborn child. Talk to your health care professional or pharmacist for more information. Do not breast-feed an infant while taking this medicine. What side effects may I notice from receiving this medicine? Side effects that you should report to your doctor or health care professional as soon as possible: -allergic reactions like skin rash, itching or hives, swelling of the face, lips, or tongue -signs of infection - fever or chills, cough, sore throat, pain or difficulty passing urine -signs of decreased platelets or bleeding - bruising, pinpoint red spots on the skin, black, tarry stools, nosebleeds -signs of decreased red blood cells - unusually weak or  tired, fainting spells, lightheadedness -breathing problems -changes in hearing -changes in vision -chest pain -high blood pressure -low blood counts - This drug may decrease the number of white blood cells, red blood cells and platelets. You may be at increased risk for infections and bleeding. -nausea and vomiting -pain, swelling, redness or irritation at the injection site -pain, tingling, numbness in the hands or feet -problems with balance, talking, walking -trouble passing urine or change in the amount of urine Side effects that usually do not require medical attention (report to your doctor or health care professional if they continue or are bothersome): -hair loss -loss of appetite -metallic taste in the mouth or changes in taste This list may not describe all possible side effects. Call your doctor for medical advice about side effects. You may report side effects to FDA at 1-800-FDA-1088. Where should I keep my medicine? This drug is given in a hospital or clinic and will not be stored at home. NOTE: This sheet is a summary. It may not cover all possible information. If you have questions about this medicine, talk to your doctor, pharmacist, or health care provider.  2015, [employer] Standard. (2007-05-09 14:38:05) Paclitaxel injection What is this medicine? PACLITAXEL (PAK li TAX el) is a chemotherapy drug. It targets fast dividing cells, like cancer cells, and causes these cells to die. This medicine is used to treat ovarian cancer, breast cancer, and other cancers. This medicine may be used for other purposes; ask your health care provider or pharmacist if you have questions. COMMON BRAND NAME(S): Onxol, Taxol What should I tell my health care provider before I take this medicine? They need to know if you have any of these conditions: -blood disorders -irregular heartbeat -infection (especially a virus infection such as chickenpox, cold sores, or herpes) -liver  disease -previous or ongoing radiation therapy -an unusual or allergic reaction to paclitaxel, alcohol, polyoxyethylated castor oil, other chemotherapy agents, other medicines, foods, dyes, or preservatives -pregnant or trying to get pregnant -breast-feeding How should I use this medicine? This drug is given as an infusion into a vein. It is administered in a hospital or clinic by a specially trained health care professional. Talk to your pediatrician regarding the use of this medicine in children. Special care may be needed. Overdosage: If you think you have taken too much of this medicine contact a poison control center or emergency room at once. NOTE: This medicine is only for you. Do not share  this medicine with others. What if I miss a dose? It is important not to miss your dose. Call your doctor or health care professional if you are unable to keep an appointment. What may interact with this medicine? Do not take this medicine with any of the following medications: -disulfiram -metronidazole This medicine may also interact with the following medications: -cyclosporine -diazepam -ketoconazole -medicines to increase blood counts like filgrastim, pegfilgrastim, sargramostim -other chemotherapy drugs like cisplatin, doxorubicin, epirubicin, etoposide, teniposide, vincristine -quinidine -testosterone -vaccines -verapamil Talk to your doctor or health care professional before taking any of these medicines: -acetaminophen -aspirin -ibuprofen -ketoprofen -naproxen This list may not describe all possible interactions. Give your health care provider a list of all the medicines, herbs, non-prescription drugs, or dietary supplements you use. Also tell them if you smoke, drink alcohol, or use illegal drugs. Some items may interact with your medicine. What should I watch for while using this medicine? Your condition will be monitored carefully while you are receiving this medicine. You will  need important blood work done while you are taking this medicine. This drug may make you feel generally unwell. This is not uncommon, as chemotherapy can affect healthy cells as well as cancer cells. Report any side effects. Continue your course of treatment even though you feel ill unless your doctor tells you to stop. In some cases, you may be given additional medicines to help with side effects. Follow all directions for their use. Call your doctor or health care professional for advice if you get a fever, chills or sore throat, or other symptoms of a cold or flu. Do not treat yourself. This drug decreases your body's ability to fight infections. Try to avoid being around people who are sick. This medicine may increase your risk to bruise or bleed. Call your doctor or health care professional if you notice any unusual bleeding. Be careful brushing and flossing your teeth or using a toothpick because you may get an infection or bleed more easily. If you have any dental work done, tell your dentist you are receiving this medicine. Avoid taking products that contain aspirin, acetaminophen, ibuprofen, naproxen, or ketoprofen unless instructed by your doctor. These medicines may hide a fever. Do not become pregnant while taking this medicine. Women should inform their doctor if they wish to become pregnant or think they might be pregnant. There is a potential for serious side effects to an unborn child. Talk to your health care professional or pharmacist for more information. Do not breast-feed an infant while taking this medicine. Men are advised not to father a child while receiving this medicine. What side effects may I notice from receiving this medicine? Side effects that you should report to your doctor or health care professional as soon as possible: -allergic reactions like skin rash, itching or hives, swelling of the face, lips, or tongue -low blood counts - This drug may decrease the number of  white blood cells, red blood cells and platelets. You may be at increased risk for infections and bleeding. -signs of infection - fever or chills, cough, sore throat, pain or difficulty passing urine -signs of decreased platelets or bleeding - bruising, pinpoint red spots on the skin, black, tarry stools, nosebleeds -signs of decreased red blood cells - unusually weak or tired, fainting spells, lightheadedness -breathing problems -chest pain -high or low blood pressure -mouth sores -nausea and vomiting -pain, swelling, redness or irritation at the injection site -pain, tingling, numbness in the hands or feet -slow  or irregular heartbeat -swelling of the ankle, feet, hands Side effects that usually do not require medical attention (report to your doctor or health care professional if they continue or are bothersome): -bone pain -complete hair loss including hair on your head, underarms, pubic hair, eyebrows, and eyelashes -changes in the color of fingernails -diarrhea -loosening of the fingernails -loss of appetite -muscle or joint pain -red flush to skin -sweating This list may not describe all possible side effects. Call your doctor for medical advice about side effects. You may report side effects to FDA at 1-800-FDA-1088. Where should I keep my medicine? This drug is given in a hospital or clinic and will not be stored at home. NOTE: This sheet is a summary. It may not cover all possible information. If you have questions about this medicine, talk to your doctor, pharmacist, or health care provider.  2015, [employer]/Gold Standard. (2012-03-27 16:41:21) Famotidine injection What is this medicine? FAMOTIDINE (fa MOE ti deen) is a type of antihistamine that blocks the release of stomach acid. It is used to treat stomach or intestinal ulcers. It can relieve ulcer pain and discomfort, and the heartburn from acid reflux. This medicine may be used for other purposes; ask your health care  provider or pharmacist if you have questions. COMMON BRAND NAME(S): Pepcid What should I tell my health care provider before I take this medicine? They need to know if you have any of these conditions: -kidney or liver disease -an unusual or allergic reaction to famotidine, other medicines, foods, dyes, or preservatives -pregnant or trying to get pregnant -breast-feeding How should I use this medicine? This medicine is for infusion into a vein. It is given by a health care professional in a hospital or clinic setting. Talk to your pediatrician regarding the use of this medicine in children. Special care may be needed. Overdosage: If you think you have taken too much of this medicine contact a poison control center or emergency room at once. NOTE: This medicine is only for you. Do not share this medicine with others. What if I miss a dose? This does not apply. What may interact with this medicine? -delavirdine -itraconazole -ketoconazole This list may not describe all possible interactions. Give your health care provider a list of all the medicines, herbs, non-prescription drugs, or dietary supplements you use. Also tell them if you smoke, drink alcohol, or use illegal drugs. Some items may interact with your medicine. What should I watch for while using this medicine? Tell your doctor or health care professional if your condition does not start to get better or gets worse. Do not take with aspirin, ibuprofen, or other antiinflammatory medicines. These can aggravate your condition. Do not smoke cigarettes or drink alcohol. These increase irritation in your stomach and can increase the time it will take for ulcers to heal. Cigarettes and alcohol can also worsen acid reflux or heartburn. If you get black, tarry stools or vomit up what looks like coffee grounds, call your doctor or health care professional at once. You may have a bleeding ulcer. What side effects may I notice from receiving this  medicine? Side effects that you should report to your doctor or health care professional as soon as possible: -allergic reactions like skin rash, itching or hives, swelling of the face, lips, or tongue -agitation, nervousness -confusion -hallucinations Side effects that usually do not require medical attention (report to your doctor or health care professional if they continue or are bothersome): -constipation -diarrhea -dizziness -headache This  list may not describe all possible side effects. Call your doctor for medical advice about side effects. You may report side effects to FDA at 1-800-FDA-1088. Where should I keep my medicine? This medicine is given in a hospital or clinic. You will not be given this medicine to store at home. NOTE: This sheet is a summary. It may not cover all possible information. If you have questions about this medicine, talk to your doctor, pharmacist, or health care provider.  2015, [employer]/Gold Standard. (2007-06-07 13:24:51) Dexamethasone injection What is this medicine? DEXAMETHASONE (dex a METH a sone) is a corticosteroid. It is used to treat inflammation of the skin, joints, lungs, and other organs. Common conditions treated include asthma, allergies, and arthritis. It is also used for other conditions, like blood disorders and diseases of the adrenal glands. This medicine may be used for other purposes; ask your health care provider or pharmacist if you have questions. COMMON BRAND NAME(S): Decadron, Solurex What should I tell my health care provider before I take this medicine? They need to know if you have any of these conditions: -blood clotting problems -Cushing's syndrome -diabetes -glaucoma -heart problems or disease -high blood pressure -infection like herpes, measles, tuberculosis, or chickenpox -kidney disease -liver disease -mental problems -myasthenia gravis -osteoporosis -previous heart attack -seizures -stomach, ulcer or  intestine disease including colitis and diverticulitis -thyroid problem -an unusual or allergic reaction to dexamethasone, corticosteroids, other medicines, lactose, foods, dyes, or preservatives -pregnant or trying to get pregnant -breast-feeding How should I use this medicine? This medicine is for injection into a muscle, joint, lesion, soft tissue, or vein. It is given by a health care professional in a hospital or clinic setting. Talk to your pediatrician regarding the use of this medicine in children. Special care may be needed. Overdosage: If you think you have taken too much of this medicine contact a poison control center or emergency room at once. NOTE: This medicine is only for you. Do not share this medicine with others. What if I miss a dose? This may not apply. If you are having a series of injections over a prolonged period, try not to miss an appointment. Call your doctor or health care professional to reschedule if you are unable to keep an appointment. What may interact with this medicine? Do not take this medicine with any of the following medications: -mifepristone, RU-486 -vaccines This medicine may also interact with the following medications: -amphotericin B -antibiotics like clarithromycin, erythromycin, and troleandomycin -aspirin and aspirin-like drugs -barbiturates like phenobarbital -carbamazepine -cholestyramine -cholinesterase inhibitors like donepezil, galantamine, rivastigmine, and tacrine -cyclosporine -digoxin -diuretics -ephedrine -male hormones, like estrogens or progestins and birth control pills -indinavir -isoniazid -ketoconazole -medicines for diabetes -medicines that improve muscle tone or strength for conditions like myasthenia gravis -NSAIDs, medicines for pain and inflammation, like ibuprofen or naproxen -phenytoin -rifampin -thalidomide -warfarin This list may not describe all possible interactions. Give your health care provider a  list of all the medicines, herbs, non-prescription drugs, or dietary supplements you use. Also tell them if you smoke, drink alcohol, or use illegal drugs. Some items may interact with your medicine. What should I watch for while using this medicine? Your condition will be monitored carefully while you are receiving this medicine. If you are taking this medicine for a long time, carry an identification card with your name and address, the type and dose of your medicine, and your doctor's name and address. This medicine may increase your risk of getting an infection. Stay away  from people who are sick. Tell your doctor or health care professional if you are around anyone with measles or chickenpox. Talk to your health care provider before you get any vaccines that you take this medicine. If you are going to have surgery, tell your doctor or health care professional that you have taken this medicine within the last twelve months. Ask your doctor or health care professional about your diet. You may need to lower the amount of salt you eat. The medicine can increase your blood sugar. If you are a diabetic check with your doctor if you need help adjusting the dose of your diabetic medicine. What side effects may I notice from receiving this medicine? Side effects that you should report to your doctor or health care professional as soon as possible: -allergic reactions like skin rash, itching or hives, swelling of the face, lips, or tongue -black or tarry stools -change in the amount of urine -changes in vision -confusion, excitement, restlessness, a false sense of well-being -fever, sore throat, sneezing, cough, or other signs of infection, wounds that will not heal -hallucinations -increased thirst -mental depression, mood swings, mistaken feelings of self importance or of being mistreated -pain in hips, back, ribs, arms, shoulders, or legs -pain, redness, or irritation at the injection  site -redness, blistering, peeling or loosening of the skin, including inside the mouth -rounding out of face -swelling of feet or lower legs -unusual bleeding or bruising -unusual tired or weak -wounds that do not heal Side effects that usually do not require medical attention (report to your doctor or health care professional if they continue or are bothersome): -diarrhea or constipation -change in taste -headache -nausea, vomiting -skin problems, acne, thin and shiny skin -touble sleeping -unusual growth of hair on the face or body -weight gain This list may not describe all possible side effects. Call your doctor for medical advice about side effects. You may report side effects to FDA at 1-800-FDA-1088. Where should I keep my medicine? This drug is given in a hospital or clinic and will not be stored at home. NOTE: This sheet is a summary. It may not cover all possible information. If you have questions about this medicine, talk to your doctor, pharmacist, or health care provider.  2015, [employer]. (2007-05-25 14:04:12) Ondansetron tablets What is this medicine? ONDANSETRON (on DAN se tron) is used to treat nausea and vomiting caused by chemotherapy. It is also used to prevent or treat nausea and vomiting after surgery. This medicine may be used for other purposes; ask your health care provider or pharmacist if you have questions. COMMON BRAND NAME(S): Zofran What should I tell my health care provider before I take this medicine? They need to know if you have any of these conditions: -heart disease -history of irregular heartbeat -liver disease -low levels of magnesium or potassium in the blood -an unusual or allergic reaction to ondansetron, granisetron, other medicines, foods, dyes, or preservatives -pregnant or trying to get pregnant -breast-feeding How should I use this medicine? Take this medicine by mouth with a glass of water. Follow the directions on  your prescription label. Take your doses at regular intervals. Do not take your medicine more often than directed. Talk to your pediatrician regarding the use of this medicine in children. Special care may be needed. Overdosage: If you think you have taken too much of this medicine contact a poison control center or emergency room at once. NOTE: This medicine is only for you. Do not share  this medicine with others. What if I miss a dose? If you miss a dose, take it as soon as you can. If it is almost time for your next dose, take only that dose. Do not take double or extra doses. What may interact with this medicine? Do not take this medicine with any of the following medications: -apomorphine -certain medicines for fungal infections like fluconazole, itraconazole, ketoconazole, posaconazole, voriconazole -cisapride -dofetilide -dronedarone -pimozide -thioridazine -ziprasidone This medicine may also interact with the following medications: -carbamazepine -certain medicines for depression, anxiety, or psychotic disturbances -fentanyl -linezolid -MAOIs like Carbex, Eldepryl, Marplan, Nardil, and Parnate -methylene blue (injected into a vein) -other medicines that prolong the QT interval (cause an abnormal heart rhythm) -phenytoin -rifampicin -tramadol This list may not describe all possible interactions. Give your health care provider a list of all the medicines, herbs, non-prescription drugs, or dietary supplements you use. Also tell them if you smoke, drink alcohol, or use illegal drugs. Some items may interact with your medicine. What should I watch for while using this medicine? Check with your doctor or health care professional right away if you have any sign of an allergic reaction. What side effects may I notice from receiving this medicine? Side effects that you should report to your doctor or health care professional as soon as possible: -allergic reactions like skin rash,  itching or hives, swelling of the face, lips or tongue -breathing problems -confusion -dizziness -fast or irregular heartbeat -feeling faint or lightheaded, falls -fever and chills -loss of balance or coordination -seizures -sweating -swelling of the hands or feet -tightness in the chest -tremors -unusually weak or tired Side effects that usually do not require medical attention (report to your doctor or health care professional if they continue or are bothersome): -constipation or diarrhea -headache This list may not describe all possible side effects. Call your doctor for medical advice about side effects. You may report side effects to FDA at 1-800-FDA-1088. Where should I keep my medicine? Keep out of the reach of children. Store between 2 and 30 degrees C (36 and 86 degrees F). Throw away any unused medicine after the expiration date. NOTE: This sheet is a summary. It may not cover all possible information. If you have questions about this medicine, talk to your doctor, pharmacist, or health care provider.  2015, [employer]. (2012-11-08 16:27:45) Ondansetron injection What is this medicine? ONDANSETRON (on DAN se tron) is used to treat nausea and vomiting caused by chemotherapy. It is also used to prevent or treat nausea and vomiting after surgery. This medicine may be used for other purposes; ask your health care provider or pharmacist if you have questions. COMMON BRAND NAME(S): Zofran What should I tell my health care provider before I take this medicine? They need to know if you have any of these conditions: -heart disease -history of irregular heartbeat -liver disease -low levels of magnesium or potassium in the blood -an unusual or allergic reaction to ondansetron, granisetron, other medicines, foods, dyes, or preservatives -pregnant or trying to get pregnant -breast-feeding How should I use this medicine? This medicine is for infusion into a vein. It is  given by a health care professional in a hospital or clinic setting. Talk to your pediatrician regarding the use of this medicine in children. Special care may be needed. Overdosage: If you think you have taken too much of this medicine contact a poison control center or emergency room at once. NOTE: This medicine is only for you. Do not  share this medicine with others. What if I miss a dose? This does not apply. What may interact with this medicine? Do not take this medicine with any of the following medications: -apomorphine -certain medicines for fungal infections like fluconazole, itraconazole, ketoconazole, posaconazole, voriconazole -cisapride -dofetilide -dronedarone -pimozide -thioridazine -ziprasidone This medicine may also interact with the following medications: -carbamazepine -certain medicines for depression, anxiety, or psychotic disturbances -fentanyl -linezolid -MAOIs like Carbex, Eldepryl, Marplan, Nardil, and Parnate -methylene blue (injected into a vein) -other medicines that prolong the QT interval (cause an abnormal heart rhythm) -phenytoin -rifampicin -tramadol This list may not describe all possible interactions. Give your health care provider a list of all the medicines, herbs, non-prescription drugs, or dietary supplements you use. Also tell them if you smoke, drink alcohol, or use illegal drugs. Some items may interact with your medicine. What should I watch for while using this medicine? Your condition will be monitored carefully while you are receiving this medicine. What side effects may I notice from receiving this medicine? Side effects that you should report to your doctor or health care professional as soon as possible: -allergic reactions like skin rash, itching or hives, swelling of the face, lips, or tongue -breathing problems -confusion -dizziness -fast or irregular heartbeat -feeling faint or lightheaded, falls -fever and chills -loss of  balance or coordination -seizures -sweating -swelling of the hands and feet -tightness in the chest -tremors -unusually weak or tired Side effects that usually do not require medical attention (report to your doctor or health care professional if they continue or are bothersome): -constipation or diarrhea -headache This list may not describe all possible side effects. Call your doctor for medical advice about side effects. You may report side effects to FDA at 1-800-FDA-1088. Where should I keep my medicine? This drug is given in a hospital or clinic and will not be stored at home. NOTE: This sheet is a summary. It may not cover all possible information. If you have questions about this medicine, talk to your doctor, pharmacist, or health care provider.  2015, [employer]. (2012-11-08 16:18:28) Diphenhydramine injection What is this medicine? DIPHENHYDRAMINE (dye fen HYE dra meen) is an antihistamine. It is used to treat the symptoms of an allergic reaction and motion sickness. It is also used to treat Parkinson's disease. This medicine may be used for other purposes; ask your health care provider or pharmacist if you have questions. COMMON BRAND NAME(S): Benadryl What should I tell my health care provider before I take this medicine? They need to know if you have any of these conditions: -asthma or lung disease -glaucoma -high blood pressure or heart disease -liver disease -pain or difficulty passing urine -prostate trouble -ulcers or other stomach problems -an unusual or allergic reaction to diphenhydramine, antihistamines, other medicines foods, dyes, or preservatives -pregnant or trying to get pregnant -breast-feeding How should I use this medicine? This medicine is for injection into a vein or a muscle. It is usually given by a health care professional in a hospital or clinic setting. If you get this medicine at home, you will be taught how to prepare and give this  medicine. Use exactly as directed. Take your medicine at regular intervals. Do not take your medicine more often than directed. It is important that you put your used needles and syringes in a special sharps container. Do not put them in a trash can. If you do not have a sharps container, call your pharmacist or healthcare provider to get one. Talk  to your pediatrician regarding the use of this medicine in children. While this drug may be prescribed for selected conditions, precautions do apply. This medicine is not approved for use in newborns and premature babies. Patients over 36 years old may have a stronger reaction and need a smaller dose. Overdosage: If you think you have taken too much of this medicine contact a poison control center or emergency room at once. NOTE: This medicine is only for you. Do not share this medicine with others. What if I miss a dose? If you miss a dose, take it as soon as you can. If it is almost time for your next dose, take only that dose. Do not take double or extra doses. What may interact with this medicine? Do not take this medicine with any of the following medications: -MAOIs like Carbex, Eldepryl, Marplan, Nardil, and Parnate This medicine may also interact with the following medications: -alcohol -barbiturates, like phenobarbital -medicines for bladder spasm like oxybutynin, tolterodine -medicines for blood pressure -medicines for depression, anxiety, or psychotic disturbances -medicines for movement abnormalities or Parkinson's disease -medicines for sleep -other medicines for cold, cough or allergy -some medicines for the stomach like chlordiazepoxide, dicyclomine This list may not describe all possible interactions. Give your health care provider a list of all the medicines, herbs, non-prescription drugs, or dietary supplements you use. Also tell them if you smoke, drink alcohol, or use illegal drugs. Some items may interact with your medicine. What  should I watch for while using this medicine? Your condition will be monitored carefully while you are receiving this medicine. Tell your doctor or healthcare professional if your symptoms do not start to get better or if they get worse. You may get drowsy or dizzy. Do not drive, use machinery, or do anything that needs mental alertness until you know how this medicine affects you. Do not stand or sit up quickly, especially if you are an older patient. This reduces the risk of dizzy or fainting spells. Alcohol may interfere with the effect of this medicine. Avoid alcoholic drinks. Your mouth may get dry. Chewing sugarless gum or sucking hard candy, and drinking plenty of water may help. Contact your doctor if the problem does not go away or is severe. What side effects may I notice from receiving this medicine? Side effects that you should report to your doctor or health care professional as soon as possible: -allergic reactions like skin rash, itching or hives, swelling of the face, lips, or tongue -breathing problems -changes in vision -chills -confused, agitated, nervous -irregular or fast heartbeat -low blood pressure -seizures -tremor -trouble passing urine -unusual bleeding or bruising -unusually weak or tired Side effects that usually do not require medical attention (report to your doctor or health care professional if they continue or are bothersome): -constipation, diarrhea -drowsy -headache -loss of appetite -stomach upset, vomiting -sweating -thick mucous This list may not describe all possible side effects. Call your doctor for medical advice about side effects. You may report side effects to FDA at 1-800-FDA-1088. Where should I keep my medicine? Keep out of the reach of children. If you are using this medicine at home, you will be instructed on how to store this medicine. Throw away any unused medicine after the expiration date on the label. NOTE: This sheet is a summary.  It may not cover all possible information. If you have questions about this medicine, talk to your doctor, pharmacist, or health care provider.  2015, [employer] Standard. (2007-05-23 14:28:35) Prochlorperazine  tablets What is this medicine? PROCHLORPERAZINE (proe klor PER a zeen) helps to control severe nausea and vomiting. This medicine is also used to treat schizophrenia. It can also help patients who experience anxiety that is not due to psychological illness. This medicine may be used for other purposes; ask your health care provider or pharmacist if you have questions. COMMON BRAND NAME(S): Compazine What should I tell my health care provider before I take this medicine? They need to know if you have any of these conditions: -blood disorders or disease -dementia -liver disease or jaundice -Parkinson's disease -uncontrollable movement disorder -an unusual or allergic reaction to prochlorperazine, other medicines, foods, dyes, or preservatives -pregnant or trying to get pregnant -breast-feeding How should I use this medicine? Take this medicine by mouth with a glass of water. Follow the directions on the prescription label. Take your doses at regular intervals. Do not take your medicine more often than directed. Do not stop taking this medicine suddenly. This can cause nausea, vomiting, and dizziness. Ask your doctor or health care professional for advice. Talk to your pediatrician regarding the use of this medicine in children. Special care may be needed. While this drug may be prescribed for children as young as 2 years for selected conditions, precautions do apply. Overdosage: If you think you have taken too much of this medicine contact a poison control center or emergency room at once. NOTE: This medicine is only for you. Do not share this medicine with others. What if I miss a dose? If you miss a dose, take it as soon as you can. If it is almost time for your next dose, take only  that dose. Do not take double or extra doses. What may interact with this medicine? Do not take this medicine with any of the following medications: -amoxapine -antidepressants like citalopram, escitalopram, fluoxetine, paroxetine, and sertraline -deferoxamine -dofetilide -maprotiline -tricyclic antidepressants like amitriptyline, clomipramine, imipramine, nortiptyline and others This medicine may also interact with the following medications: -lithium -medicines for pain -phenytoin -propranolol -warfarin This list may not describe all possible interactions. Give your health care provider a list of all the medicines, herbs, non-prescription drugs, or dietary supplements you use. Also tell them if you smoke, drink alcohol, or use illegal drugs. Some items may interact with your medicine. What should I watch for while using this medicine? Visit your doctor or health care professional for regular checks on your progress. You may get drowsy or dizzy. Do not drive, use machinery, or do anything that needs mental alertness until you know how this medicine affects you. Do not stand or sit up quickly, especially if you are an older patient. This reduces the risk of dizzy or fainting spells. Alcohol may interfere with the effect of this medicine. Avoid alcoholic drinks. This medicine can reduce the response of your body to heat or cold. Dress warm in cold weather and stay hydrated in hot weather. If possible, avoid extreme temperatures like saunas, hot tubs, very hot or cold showers, or activities that can cause dehydration such as vigorous exercise. This medicine can make you more sensitive to the sun. Keep out of the sun. If you cannot avoid being in the sun, wear protective clothing and use sunscreen. Do not use sun lamps or tanning beds/booths. Your mouth may get dry. Chewing sugarless gum or sucking hard candy, and drinking plenty of water may help. Contact your doctor if the problem does not go away  or is severe. What side effects may I  notice from receiving this medicine? Side effects that you should report to your doctor or health care professional as soon as possible: -blurred vision -breast enlargement in men or women -breast milk in women who are not breast-feeding -chest pain, fast or irregular heartbeat -confusion, restlessness -dark yellow or brown urine -difficulty breathing or swallowing -dizziness or fainting spells -drooling, shaking, movement difficulty (shuffling walk) or rigidity -fever, chills, sore throat -involuntary or uncontrollable movements of the eyes, mouth, head, arms, and legs -seizures -stomach area pain -unusually weak or tired -unusual bleeding or bruising -yellowing of skin or eyes Side effects that usually do not require medical attention (report to your doctor or health care professional if they continue or are bothersome): -difficulty passing urine -difficulty sleeping -headache -sexual dysfunction -skin rash, or itching This list may not describe all possible side effects. Call your doctor for medical advice about side effects. You may report side effects to FDA at 1-800-FDA-1088. Where should I keep my medicine? Keep out of the reach of children. Store at room temperature between 15 and 30 degrees C (59 and 86 degrees F). Protect from light. Throw away any unused medicine after the expiration date. NOTE: This sheet is a summary. It may not cover all possible information. If you have questions about this medicine, talk to your doctor, pharmacist, or health care provider.  2015, [employer]. (2011-06-22 16:59:39) Lidocaine; Prilocaine cream What is this medicine? LIDOCAINE; PRILOCAINE (LYE doe kane; PRIL oh kane) is a topical anesthetic that causes loss of feeling in the skin and surrounding tissues. It is used to numb the skin before procedures or injections. This medicine may be used for other purposes; ask your health care  provider or pharmacist if you have questions. COMMON BRAND NAME(S): EMLA What should I tell my health care provider before I take this medicine? They need to know if you have any of these conditions: -glucose-6-phosphate deficiencies -heart disease -kidney or liver disease -methemoglobinemia -an unusual or allergic reaction to lidocaine, prilocaine, other medicines, foods, dyes, or preservatives -pregnant or trying to get pregnant -breast-feeding How should I use this medicine? This medicine is for external use only on the skin. Do not take by mouth. Follow the directions on the prescription label. Wash hands before and after use. Do not use more or leave in contact with the skin longer than directed. Do not apply to eyes or open wounds. It can cause irritation and blurred or temporary loss of vision. If this medicine comes in contact with your eyes, immediately rinse the eye with water. Do not touch or rub the eye. Contact your health care provider right away. Talk to your pediatrician regarding the use of this medicine in children. While this medicine may be prescribed for children for selected conditions, precautions do apply. Overdosage: If you think you have taken too much of this medicine contact a poison control center or emergency room at once. NOTE: This medicine is only for you. Do not share this medicine with others. What if I miss a dose? This medicine is usually only applied once prior to each procedure. It must be in contact with the skin for a period of time for it to work. If you applied this medicine later than directed, tell your health care professional before starting the procedure. What may interact with this medicine? -acetaminophen -chloroquine -dapsone -medicines to control heart rhythm -nitrates like nitroglycerin and nitroprusside -other ointments, creams, or sprays that may contain anesthetic medicine -phenobarbital -phenytoin -quinine -sulfonamides like  sulfacetamide, sulfamethoxazole, sulfasalazine and others This list may not describe all possible interactions. Give your health care provider a list of all the medicines, herbs, non-prescription drugs, or dietary supplements you use. Also tell them if you smoke, drink alcohol, or use illegal drugs. Some items may interact with your medicine. What should I watch for while using this medicine? Be careful to avoid injury to the treated area while it is numb and you are not aware of pain. Avoid scratching, rubbing, or exposing the treated area to hot or cold temperatures until complete sensation has returned. The numb feeling will wear off a few hours after applying the cream. What side effects may I notice from receiving this medicine? Side effects that you should report to your doctor or health care professional as soon as possible: -blurred vision -chest pain -difficulty breathing -dizziness -drowsiness -fast or irregular heartbeat -skin rash or itching -swelling of your throat, lips, or face -trembling Side effects that usually do not require medical attention (report to your doctor or health care professional if they continue or are bothersome): -changes in ability to feel hot or cold -redness and swelling at the application site This list may not describe all possible side effects. Call your doctor for medical advice about side effects. You may report side effects to FDA at 1-800-FDA-1088. Where should I keep my medicine? Keep out of reach of children. Store at room temperature between 15 and 30 degrees C (59 and 86 degrees F). Keep container tightly closed. Throw away any unused medicine after the expiration date. NOTE: This sheet is a summary. It may not cover all possible information. If you have questions about this medicine, talk to your doctor, pharmacist, or health care provider.  2015, [employer]. (2007-08-07 17:14:35)

## 2014-06-14 NOTE — Progress Notes (Signed)
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 Pt used to way much more years ago. He states his normal weight is 150. When I said it looks like he has been losing weight he replied "I didn't say I was losing weight, rather I lost weight".  Patient reports oral intake as fair.   He says he usually has a couple cans of soup a day, with a snack (ice cream) before bed. He also states he drinks 4-5 boost supplements a day which he started a couple weeks ago. Let patient know he is eligible for 3 free cases of Ensure. He was interested and asked for a BP case.   He says he has started to have throat pain and some difficulty swallowing. Discussed moist/soft foods that will be easier for him to swallow.   Discussed with pt what my role would be in his upcoming treatment. Also discussed importance of maintaining weight.   Depending on the Boost supplement he is drinking, he is probably receiving 1250 kcals from these, about half of what he should be getting  Est Needs: Kcals: 2100-2300 (32-35 kcal/kg) Protein >98 g (1.5 kcal/kg) Fluid: >2.1 liters   Pt has not had any weight measurements since he says he started boost supplementation. Will closely follow weight over coming weeks  Will meet with him at next appt scheduled 5/3.  Mailed my contact info, coupons, and handouts titled "Soft and Moist High-Protein Menu Idea", "Swallowing Difficulties" (Dysphagia)" and  "Sore or Irritated Throat".   Burtis Junes RD, LDN Nutrition Pager: [phone] 06/14/2014 11:43 AM

## 2014-06-17 ENCOUNTER — Telehealth: Payer: Self-pay | Admitting: Gastroenterology

## 2014-06-17 ENCOUNTER — Ambulatory Visit (HOSPITAL_COMMUNITY)
Admission: RE | Admit: 2014-06-17 | Discharge: 2014-06-17 | Disposition: A | Discharge status: Home / Self Care | Payer: Medicaid Other | Source: Ambulatory Visit | Attending: Hematology & Oncology | Admitting: Hematology & Oncology

## 2014-06-17 ENCOUNTER — Other Ambulatory Visit (HOSPITAL_COMMUNITY): Payer: Self-pay | Admitting: *Deleted

## 2014-06-17 ENCOUNTER — Encounter (HOSPITAL_COMMUNITY): Payer: Self-pay

## 2014-06-17 DIAGNOSIS — C159 Malignant neoplasm of esophagus, unspecified: Secondary | ICD-10-CM

## 2014-06-17 DIAGNOSIS — C155 Malignant neoplasm of lower third of esophagus: Secondary | ICD-10-CM | POA: Insufficient documentation

## 2014-06-17 DIAGNOSIS — Z7982 Long term (current) use of aspirin: Secondary | ICD-10-CM | POA: Insufficient documentation

## 2014-06-17 DIAGNOSIS — M1990 Unspecified osteoarthritis, unspecified site: Secondary | ICD-10-CM | POA: Diagnosis not present

## 2014-06-17 DIAGNOSIS — M109 Gout, unspecified: Secondary | ICD-10-CM | POA: Diagnosis not present

## 2014-06-17 DIAGNOSIS — Z79899 Other long term (current) drug therapy: Secondary | ICD-10-CM | POA: Insufficient documentation

## 2014-06-17 DIAGNOSIS — Z87891 Personal history of nicotine dependence: Secondary | ICD-10-CM | POA: Insufficient documentation

## 2014-06-17 DIAGNOSIS — D649 Anemia, unspecified: Secondary | ICD-10-CM | POA: Insufficient documentation

## 2014-06-17 LAB — CBC WITH DIFFERENTIAL/PLATELET
BASOS ABS: 0 10*3/uL (ref 0.0–0.1)
BASOS PCT: 0 % (ref 0–1)
EOS ABS: 0 10*3/uL (ref 0.0–0.7)
EOS PCT: 0 % (ref 0–5)
HCT: 32.6 % — ABNORMAL LOW (ref 39.0–52.0)
Hemoglobin: 10.1 g/dL — ABNORMAL LOW (ref 13.0–17.0)
Lymphocytes Relative: 5 % — ABNORMAL LOW (ref 12–46)
Lymphs Abs: 0.5 10*3/uL — ABNORMAL LOW (ref 0.7–4.0)
MCH: 26.2 pg (ref 26.0–34.0)
MCHC: 31 g/dL (ref 30.0–36.0)
MCV: 84.5 fL (ref 78.0–100.0)
MONO ABS: 1 10*3/uL (ref 0.1–1.0)
MONOS PCT: 10 % (ref 3–12)
NEUTROS PCT: 85 % — AB (ref 43–77)
Neutro Abs: 9.1 10*3/uL — ABNORMAL HIGH (ref 1.7–7.7)
Platelets: 218 10*3/uL (ref 150–400)
RBC: 3.86 MIL/uL — ABNORMAL LOW (ref 4.22–5.81)
RDW: 14.2 % (ref 11.5–15.5)
WBC: 10.6 10*3/uL — AB (ref 4.0–10.5)

## 2014-06-17 LAB — INFLUENZA PANEL BY PCR (TYPE A & B)
H1N1 flu by pcr: NOT DETECTED
INFLBPCR: NEGATIVE
Influenza A By PCR: NEGATIVE

## 2014-06-17 LAB — APTT: aPTT: 26 seconds (ref 24–37)

## 2014-06-17 LAB — PROTIME-INR
INR: 1.26 (ref 0.00–1.49)
PROTHROMBIN TIME: 15.9 s — AB (ref 11.6–15.2)

## 2014-06-17 MED ORDER — ONDANSETRON HCL 8 MG PO TABS: 8.0000 mg | ORAL_TABLET | Freq: Three times a day (TID) | ORAL | Status: DC | PRN

## 2014-06-17 MED ORDER — CEFAZOLIN SODIUM-DEXTROSE 2-3 GM-% IV SOLR
2.0000 g | INTRAVENOUS | Status: DC
Start: 2014-06-17 — End: 2014-06-18

## 2014-06-17 MED ORDER — SODIUM CHLORIDE 0.9 % IV SOLN
INTRAVENOUS | Status: DC
  Administered 2014-06-17: 500 mL via INTRAVENOUS
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
## 2014-06-17 NOTE — Telephone Encounter (Signed)
Left message to call back  

## 2014-06-17 NOTE — Progress Notes (Signed)
Procedure canceled. Influenza Panel with PCR swab done and tubed to lab. Pt was given a prescription for antibiotics and discharged

## 2014-06-17 NOTE — Progress Notes (Signed)
Pt here for prep for PAC placement. Pt feels warm to touch and temp upon arrival was 98.9 oral. Rechecked with different thermometer and was 101.4 oral. Reports this to Vance in IR who reported to Rowe Robert PA. Lab results pending

## 2014-06-17 NOTE — Discharge Instructions (Signed)
You should receive call from your Dr about the results . Take prescription for antibiotics as directed

## 2014-06-18 ENCOUNTER — Ambulatory Visit (HOSPITAL_COMMUNITY): Payer: Self-pay

## 2014-06-18 NOTE — Telephone Encounter (Signed)
Pt sister notified to call Dr [name] for follow up care. Dr Ardis Hughs did EUS.  Pt sister agreed and states she did call that office and has had her questions answered and thanked me for calling her back.

## 2014-06-20 ENCOUNTER — Other Ambulatory Visit (HOSPITAL_COMMUNITY): Payer: Self-pay | Admitting: *Deleted

## 2014-06-20 ENCOUNTER — Inpatient Hospital Stay (HOSPITAL_COMMUNITY): Payer: Self-pay

## 2014-06-20 DIAGNOSIS — C159 Malignant neoplasm of esophagus, unspecified: Secondary | ICD-10-CM

## 2014-06-21 ENCOUNTER — Encounter: Payer: Self-pay | Admitting: Dietician

## 2014-06-21 ENCOUNTER — Encounter (HOSPITAL_BASED_OUTPATIENT_CLINIC_OR_DEPARTMENT_OTHER): Payer: Medicaid Other

## 2014-06-21 ENCOUNTER — Encounter (HOSPITAL_COMMUNITY): Payer: Medicaid Other | Attending: Hematology & Oncology

## 2014-06-21 VITALS — BP 95/56 | HR 71 | Temp 97.4°F | Resp 20 | Wt 141.6 lb

## 2014-06-21 DIAGNOSIS — C159 Malignant neoplasm of esophagus, unspecified: Secondary | ICD-10-CM | POA: Diagnosis not present

## 2014-06-21 DIAGNOSIS — C155 Malignant neoplasm of lower third of esophagus: Secondary | ICD-10-CM

## 2014-06-21 LAB — CBC WITH DIFFERENTIAL/PLATELET
BASOS PCT: 4 % — AB (ref 0–1)
Basophils Absolute: 0.5 10*3/uL — ABNORMAL HIGH (ref 0.0–0.1)
Eosinophils Absolute: 0.1 10*3/uL (ref 0.0–0.7)
Eosinophils Relative: 1 % (ref 0–5)
HCT: 28.9 % — ABNORMAL LOW (ref 39.0–52.0)
Hemoglobin: 9.4 g/dL — ABNORMAL LOW (ref 13.0–17.0)
LYMPHS PCT: 21 % (ref 12–46)
Lymphs Abs: 2.6 10*3/uL (ref 0.7–4.0)
MCH: 27.3 pg (ref 26.0–34.0)
MCHC: 32.5 g/dL (ref 30.0–36.0)
MCV: 84 fL (ref 78.0–100.0)
Monocytes Absolute: 1.1 10*3/uL — ABNORMAL HIGH (ref 0.1–1.0)
Monocytes Relative: 9 % (ref 3–12)
Neutro Abs: 8.1 10*3/uL — ABNORMAL HIGH (ref 1.7–7.7)
Neutrophils Relative %: 66 % (ref 43–77)
PLATELETS: 270 10*3/uL (ref 150–400)
RBC: 3.44 MIL/uL — ABNORMAL LOW (ref 4.22–5.81)
RDW: 14.6 % (ref 11.5–15.5)
WBC: 12.3 10*3/uL — AB (ref 4.0–10.5)

## 2014-06-21 MED ORDER — ONDANSETRON HCL 8 MG PO TABS: 8.0000 mg | ORAL_TABLET | Freq: Three times a day (TID) | ORAL | Status: DC | PRN

## 2014-06-21 MED ORDER — LIDOCAINE-PRILOCAINE 2.5-2.5 % EX CREA: TOPICAL_CREAM | CUTANEOUS | Status: AC

## 2014-06-21 MED ORDER — PROCHLORPERAZINE MALEATE 10 MG PO TABS: 10.0000 mg | ORAL_TABLET | Freq: Four times a day (QID) | ORAL | Status: AC | PRN

## 2014-06-21 NOTE — Progress Notes (Signed)
Lab drawn.

## 2014-06-21 NOTE — Progress Notes (Signed)
Following up with patient in person after I talked with him on the phone about his trouble swallowing, irritated throat and the free cases of Ensure we offer.  Talked with pt when I dropped off his Ensure Case  Wt Readings from Last 10 Encounters:  06/21/14 141 lb 9.6 oz (64.229 kg)  06/12/14 143 lb 12.8 oz (65.227 kg)  05/27/14 148 lb 9.6 oz (67.405 kg)  05/27/11 168 lb (76.204 kg)  09/20/07 184 lb (83.462 kg)  08/22/07 187 lb (84.823 kg)  06/28/07 187 lb (84.823 kg)  05/23/07 191 lb (86.637 kg)  04/05/07 190 lb (86.183 kg)  01/09/07 189 lb (85.73 kg)   Patient weight has decreased by 7 lbs  Patient reports oral intake as fair and is still suffering from symptoms including trouble swallowing and taste changes  Discussed with pt that he has lost 7 pounds and he should really try to start packing on the calories. Family member asked me about foods he can have and I told him to eat "whatever he wants, when he wants it, and however much he wants" as long as it does not have interactions with his meds.   He says he has tried eggs, but they "tare of my stomach". I suggested high protein alternatives like PB and cheese. Also talked about easy ways to increase calories like adding whipped cream to beverages or putting it on top of foods. Also recommend only doing whole milk and not a lower fat option.   Unfortunately, patient did not really seem too motivated like his family member did. He is very thin and is continuing to lose weight. Hopefully she can aid him in gaining weight.  Left the case of ensure with handouts titled "Increasing protein and calories" and "Easy-to-Chew-and-Swallow Foods"   Burtis Junes RD, LDN Nutrition Pager: [phone] 06/21/2014 11:40 AM

## 2014-06-21 NOTE — Progress Notes (Signed)
Chemotherapy teaching done and consent signed for Carboplatin & Taxol. Distress screening done. Med calendar to be made and given to pt. Sister present during teaching. Port appt given to patient. Chemo appts given to patient. Barnett Applebaum @ [facility] notified that patient would be starting chemo on Friday 5/13. I notified her that Dr. [name] said that XRT could go ahead and be started ahead of chemotherapy. Meds called into [facility]. [facility] to pay for patient's Compazine and EMLA cream. Sister notified of meds being called in to [facility]. Burtis Junes RD came up and spoke with pt and sister today, gave literature, and gave pt a case of butter pecan Ensure.

## 2014-06-25 ENCOUNTER — Encounter: Payer: Self-pay | Admitting: Cardiothoracic Surgery

## 2014-06-25 ENCOUNTER — Other Ambulatory Visit (HOSPITAL_COMMUNITY): Payer: Self-pay | Admitting: *Deleted

## 2014-06-25 DIAGNOSIS — C159 Malignant neoplasm of esophagus, unspecified: Secondary | ICD-10-CM

## 2014-06-25 MED ORDER — HYDROCODONE-ACETAMINOPHEN 5-325 MG PO TABS: 1.0000 | ORAL_TABLET | ORAL | Status: DC | PRN

## 2014-06-26 ENCOUNTER — Other Ambulatory Visit: Payer: Self-pay | Admitting: Radiology

## 2014-06-27 ENCOUNTER — Ambulatory Visit (HOSPITAL_COMMUNITY)
Admission: RE | Admit: 2014-06-27 | Discharge: 2014-06-27 | Disposition: A | Discharge status: Home / Self Care | Payer: Medicaid Other | Source: Ambulatory Visit | Attending: Hematology & Oncology | Admitting: Hematology & Oncology

## 2014-06-27 ENCOUNTER — Other Ambulatory Visit (HOSPITAL_COMMUNITY): Payer: Self-pay | Admitting: Hematology & Oncology

## 2014-06-27 ENCOUNTER — Encounter (HOSPITAL_COMMUNITY)
Admission: RE | Admit: 2014-06-27 | Discharge: 2014-06-27 | Disposition: A | Discharge status: Home / Self Care | Payer: Medicaid Other | Source: Ambulatory Visit | Attending: Hematology & Oncology | Admitting: Hematology & Oncology

## 2014-06-27 ENCOUNTER — Encounter: Payer: Self-pay | Admitting: Thoracic Surgery (Cardiothoracic Vascular Surgery)

## 2014-06-27 ENCOUNTER — Encounter (HOSPITAL_COMMUNITY): Payer: Self-pay

## 2014-06-27 DIAGNOSIS — R1310 Dysphagia, unspecified: Secondary | ICD-10-CM | POA: Insufficient documentation

## 2014-06-27 DIAGNOSIS — M109 Gout, unspecified: Secondary | ICD-10-CM | POA: Diagnosis not present

## 2014-06-27 DIAGNOSIS — C159 Malignant neoplasm of esophagus, unspecified: Secondary | ICD-10-CM | POA: Diagnosis not present

## 2014-06-27 DIAGNOSIS — D649 Anemia, unspecified: Secondary | ICD-10-CM | POA: Insufficient documentation

## 2014-06-27 DIAGNOSIS — Z87891 Personal history of nicotine dependence: Secondary | ICD-10-CM | POA: Insufficient documentation

## 2014-06-27 LAB — CBC WITH DIFFERENTIAL/PLATELET
BASOS ABS: 0 10*3/uL (ref 0.0–0.1)
BASOS PCT: 0 % (ref 0–1)
EOS ABS: 0.1 10*3/uL (ref 0.0–0.7)
Eosinophils Relative: 1 % (ref 0–5)
HCT: 27 % — ABNORMAL LOW (ref 39.0–52.0)
HEMOGLOBIN: 8.6 g/dL — AB (ref 13.0–17.0)
Lymphocytes Relative: 16 % (ref 12–46)
Lymphs Abs: 1.5 10*3/uL (ref 0.7–4.0)
MCH: 26.5 pg (ref 26.0–34.0)
MCHC: 31.9 g/dL (ref 30.0–36.0)
MCV: 83.1 fL (ref 78.0–100.0)
Monocytes Absolute: 0.6 10*3/uL (ref 0.1–1.0)
Monocytes Relative: 6 % (ref 3–12)
NEUTROS PCT: 77 % (ref 43–77)
Neutro Abs: 7.2 10*3/uL (ref 1.7–7.7)
Platelets: 346 10*3/uL (ref 150–400)
RBC: 3.25 MIL/uL — AB (ref 4.22–5.81)
RDW: 14.4 % (ref 11.5–15.5)
WBC: 9.4 10*3/uL (ref 4.0–10.5)

## 2014-06-27 LAB — PROTIME-INR
INR: 1.18 (ref 0.00–1.49)
PROTHROMBIN TIME: 15.1 s (ref 11.6–15.2)

## 2014-06-27 MED ORDER — FENTANYL CITRATE (PF) 100 MCG/2ML IJ SOLN
INTRAMUSCULAR | Status: AC
  Filled 2014-06-27: qty 4

## 2014-06-27 MED ORDER — MIDAZOLAM HCL 2 MG/2ML IJ SOLN
INTRAMUSCULAR | Status: AC | PRN
  Administered 2014-06-27 (×4): 0.5 mg via INTRAVENOUS
  Administered 2014-06-27: 1 mg via INTRAVENOUS

## 2014-06-27 MED ORDER — HEPARIN SOD (PORK) LOCK FLUSH 100 UNIT/ML IV SOLN
INTRAVENOUS | Status: AC
  Filled 2014-06-27: qty 5

## 2014-06-27 MED ORDER — SODIUM CHLORIDE 0.9 % IV SOLN
INTRAVENOUS | Status: DC
  Administered 2014-06-27: 500 mL via INTRAVENOUS

## 2014-06-27 MED ORDER — MIDAZOLAM HCL 2 MG/2ML IJ SOLN
INTRAMUSCULAR | Status: AC
  Filled 2014-06-27: qty 6

## 2014-06-27 MED ORDER — CEFAZOLIN SODIUM-DEXTROSE 2-3 GM-% IV SOLR
INTRAVENOUS | Status: AC
  Filled 2014-06-27: qty 50

## 2014-06-27 MED ORDER — LIDOCAINE HCL 1 % IJ SOLN
INTRAMUSCULAR | Status: AC
  Filled 2014-06-27: qty 20

## 2014-06-27 MED ORDER — FENTANYL CITRATE (PF) 100 MCG/2ML IJ SOLN
INTRAMUSCULAR | Status: AC | PRN
  Administered 2014-06-27 (×2): 25 ug via INTRAVENOUS

## 2014-06-27 MED ORDER — CEFAZOLIN SODIUM-DEXTROSE 2-3 GM-% IV SOLR
2.0000 g | Freq: Once | INTRAVENOUS | Status: AC
  Administered 2014-06-27: 2 g via INTRAVENOUS
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
## 2014-06-27 NOTE — Discharge Instructions (Signed)

## 2014-06-27 NOTE — Procedures (Signed)
RIJV PAC SVC RA No comp 

## 2014-06-28 ENCOUNTER — Encounter (HOSPITAL_BASED_OUTPATIENT_CLINIC_OR_DEPARTMENT_OTHER): Payer: Medicaid Other

## 2014-06-28 ENCOUNTER — Encounter (HOSPITAL_COMMUNITY): Payer: Self-pay | Admitting: Hematology & Oncology

## 2014-06-28 ENCOUNTER — Encounter (HOSPITAL_BASED_OUTPATIENT_CLINIC_OR_DEPARTMENT_OTHER): Payer: Medicaid Other | Admitting: Hematology & Oncology

## 2014-06-28 VITALS — BP 104/59 | HR 81 | Temp 98.0°F | Resp 18 | Wt 138.2 lb

## 2014-06-28 VITALS — BP 108/61 | HR 63 | Temp 98.4°F | Resp 20

## 2014-06-28 DIAGNOSIS — Z5111 Encounter for antineoplastic chemotherapy: Secondary | ICD-10-CM

## 2014-06-28 DIAGNOSIS — R634 Abnormal weight loss: Secondary | ICD-10-CM

## 2014-06-28 DIAGNOSIS — C159 Malignant neoplasm of esophagus, unspecified: Secondary | ICD-10-CM

## 2014-06-28 DIAGNOSIS — R1310 Dysphagia, unspecified: Secondary | ICD-10-CM

## 2014-06-28 DIAGNOSIS — C155 Malignant neoplasm of lower third of esophagus: Secondary | ICD-10-CM

## 2014-06-28 LAB — COMPREHENSIVE METABOLIC PANEL
ALT: 16 U/L — ABNORMAL LOW (ref 17–63)
AST: 19 U/L (ref 15–41)
Albumin: 2.6 g/dL — ABNORMAL LOW (ref 3.5–5.0)
Alkaline Phosphatase: 71 U/L (ref 38–126)
Anion gap: 6 (ref 5–15)
BILIRUBIN TOTAL: 0.4 mg/dL (ref 0.3–1.2)
BUN: 12 mg/dL (ref 6–20)
CO2: 30 mmol/L (ref 22–32)
Calcium: 8.7 mg/dL — ABNORMAL LOW (ref 8.9–10.3)
Chloride: 98 mmol/L — ABNORMAL LOW (ref 101–111)
Creatinine, Ser: 0.68 mg/dL (ref 0.61–1.24)
GFR calc Af Amer: >60 mL/min (ref >60)
GFR calc non Af Amer: >60 mL/min (ref >60)
Glucose, Bld: 105 mg/dL — ABNORMAL HIGH (ref 65–99)
POTASSIUM: 3.7 mmol/L (ref 3.5–5.1)
Sodium: 134 mmol/L — ABNORMAL LOW (ref 135–145)
Total Protein: 7.6 g/dL (ref 6.5–8.1)

## 2014-06-28 LAB — CBC WITH DIFFERENTIAL/PLATELET
BASOS PCT: 0 % (ref 0–1)
Basophils Absolute: 0 10*3/uL (ref 0.0–0.1)
EOS PCT: 1 % (ref 0–5)
Eosinophils Absolute: 0.1 10*3/uL (ref 0.0–0.7)
HCT: 27 % — ABNORMAL LOW (ref 39.0–52.0)
HEMOGLOBIN: 8.5 g/dL — AB (ref 13.0–17.0)
LYMPHS ABS: 1.5 10*3/uL (ref 0.7–4.0)
LYMPHS PCT: 18 % (ref 12–46)
MCH: 26.2 pg (ref 26.0–34.0)
MCHC: 31.5 g/dL (ref 30.0–36.0)
MCV: 83.1 fL (ref 78.0–100.0)
MONO ABS: 0.5 10*3/uL (ref 0.1–1.0)
MONOS PCT: 6 % (ref 3–12)
Neutro Abs: 6.4 10*3/uL (ref 1.7–7.7)
Neutrophils Relative %: 75 % (ref 43–77)
Platelets: 370 10*3/uL (ref 150–400)
RBC: 3.25 MIL/uL — AB (ref 4.22–5.81)
RDW: 14.2 % (ref 11.5–15.5)
WBC: 8.5 10*3/uL (ref 4.0–10.5)

## 2014-06-28 MED ORDER — DEXTROSE 5 % IV SOLN
50.0000 mg/m2 | Freq: Once | INTRAVENOUS | Status: AC
  Administered 2014-06-28: 90 mg via INTRAVENOUS
  Filled 2014-06-28: qty 15

## 2014-06-28 MED ORDER — SODIUM CHLORIDE 0.9 % IV SOLN
Freq: Once | INTRAVENOUS | Status: AC
  Administered 2014-06-28: 11:00:00 via INTRAVENOUS
  Filled 2014-06-28: qty 8

## 2014-06-28 MED ORDER — SODIUM CHLORIDE 0.9 % IJ SOLN
10.0000 mL | INTRAMUSCULAR | Status: DC | PRN
  Administered 2014-06-28: 10 mL
  Filled 2014-06-28: qty 10

## 2014-06-28 MED ORDER — CARBOPLATIN CHEMO INJECTION 450 MG/45ML
223.4000 mg | Freq: Once | INTRAVENOUS | Status: AC
  Administered 2014-06-28: 220 mg via INTRAVENOUS
  Filled 2014-06-28: qty 22

## 2014-06-28 MED ORDER — FAMOTIDINE IN NACL 20-0.9 MG/50ML-% IV SOLN
INTRAVENOUS | Status: AC
  Filled 2014-06-28: qty 50

## 2014-06-28 MED ORDER — SODIUM CHLORIDE 0.9 % IV SOLN
Freq: Once | INTRAVENOUS | Status: AC
  Administered 2014-06-28: 11:00:00 via INTRAVENOUS

## 2014-06-28 MED ORDER — FAMOTIDINE IN NACL 20-0.9 MG/50ML-% IV SOLN
INTRAVENOUS | Status: AC
Start: 2014-06-28 — End: 2014-06-28
  Filled 2014-06-28: qty 50

## 2014-06-28 MED ORDER — DIPHENHYDRAMINE HCL 50 MG/ML IJ SOLN
50.0000 mg | Freq: Once | INTRAMUSCULAR | Status: AC
  Administered 2014-06-28: 50 mg via INTRAVENOUS
  Filled 2014-06-28: qty 1

## 2014-06-28 MED ORDER — FAMOTIDINE IN NACL 20-0.9 MG/50ML-% IV SOLN
20.0000 mg | Freq: Once | INTRAVENOUS | Status: AC
  Administered 2014-06-28: 20 mg via INTRAVENOUS

## 2014-06-28 MED ORDER — HEPARIN SOD (PORK) LOCK FLUSH 100 UNIT/ML IV SOLN
500.0000 [IU] | Freq: Once | INTRAVENOUS | Status: AC | PRN
  Administered 2014-06-28: 500 [IU]
  Filled 2014-06-28: qty 5

## 2014-06-28 NOTE — Patient Instructions (Signed)
[facility] Discharge Instructions for Patients Receiving Chemotherapy  Today you received the following chemotherapy agents taxol, carbo Please call the clinic if you have nay questions or concerns  To help prevent nausea and vomiting after your treatment, we encourage you to take your nausea medication    If you develop nausea and vomiting, or diarrhea that is not controlled by your medication, call the clinic.  The clinic phone number is [phone]. Office hours are Monday-Friday 8:30am-5:00pm.  BELOW ARE SYMPTOMS THAT SHOULD BE REPORTED IMMEDIATELY:  *FEVER GREATER THAN 101.0 F  *CHILLS WITH OR WITHOUT FEVER  NAUSEA AND VOMITING THAT IS NOT CONTROLLED WITH YOUR NAUSEA MEDICATION  *UNUSUAL SHORTNESS OF BREATH  *UNUSUAL BRUISING OR BLEEDING  TENDERNESS IN MOUTH AND THROAT WITH OR WITHOUT PRESENCE OF ULCERS  *URINARY PROBLEMS  *BOWEL PROBLEMS  UNUSUAL RASH Items with * indicate a potential emergency and should be followed up as soon as possible. If you have an emergency after office hours please contact your primary care physician or go to the nearest emergency department.  Please call the clinic during office hours if you have any questions or concerns.   You may also contact the Patient Navigator at [phone] should you have any questions or need assistance in obtaining follow up care. _____________________________________________________________________ Have you asked about our STAR program?    STAR stands for Survivorship Training and Rehabilitation, and this is a nationally recognized cancer care program that focuses on survivorship and rehabilitation.  Cancer and cancer treatments may cause problems, such as, pain, making you feel tired and keeping you from doing the things that you need or want to do. Cancer rehabilitation can help. Our goal is to reduce these troubling effects and help you have the best quality of life possible.  You may  receive a survey from a nurse that asks questions about your current state of health.  Based on the survey results, all eligible patients will be referred to the [facility] program for an evaluation so we can better serve you! A frequently asked questions sheet is available upon request.
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
## 2014-06-28 NOTE — Progress Notes (Signed)
Anthony Cameron Tolerated chemotherapy well today Discharged ambulatory

## 2014-06-28 NOTE — Progress Notes (Signed)
What to Know After Chemo Reviewed with patient. Patient took Chemo Quiz and made a 100. Chemo Quiz given to patient to keep. Chemo calendar given to patient and pt's sister.

## 2014-06-30 ENCOUNTER — Encounter (HOSPITAL_COMMUNITY): Payer: Self-pay | Admitting: Hematology & Oncology

## 2014-07-01 ENCOUNTER — Encounter: Payer: Self-pay | Admitting: Cardiothoracic Surgery

## 2014-07-01 ENCOUNTER — Institutional Professional Consult (permissible substitution) (INDEPENDENT_AMBULATORY_CARE_PROVIDER_SITE_OTHER): Payer: Self-pay | Admitting: Cardiothoracic Surgery

## 2014-07-01 ENCOUNTER — Encounter (HOSPITAL_BASED_OUTPATIENT_CLINIC_OR_DEPARTMENT_OTHER): Payer: Medicaid Other

## 2014-07-01 DIAGNOSIS — C159 Malignant neoplasm of esophagus, unspecified: Secondary | ICD-10-CM

## 2014-07-01 DIAGNOSIS — C160 Malignant neoplasm of cardia: Secondary | ICD-10-CM

## 2014-07-01 LAB — CBC WITH DIFFERENTIAL/PLATELET
BASOS ABS: 0 10*3/uL (ref 0.0–0.1)
Basophils Relative: 0 % (ref 0–1)
EOS PCT: 1 % (ref 0–5)
Eosinophils Absolute: 0.1 10*3/uL (ref 0.0–0.7)
HCT: 29.3 % — ABNORMAL LOW (ref 39.0–52.0)
Hemoglobin: 9.2 g/dL — ABNORMAL LOW (ref 13.0–17.0)
LYMPHS PCT: 21 % (ref 12–46)
Lymphs Abs: 1.4 10*3/uL (ref 0.7–4.0)
MCH: 26.2 pg (ref 26.0–34.0)
MCHC: 31.4 g/dL (ref 30.0–36.0)
MCV: 83.5 fL (ref 78.0–100.0)
Monocytes Absolute: 0.3 10*3/uL (ref 0.1–1.0)
Monocytes Relative: 5 % (ref 3–12)
NEUTROS ABS: 4.7 10*3/uL (ref 1.7–7.7)
NEUTROS PCT: 73 % (ref 43–77)
PLATELETS: 390 10*3/uL (ref 150–400)
RBC: 3.51 MIL/uL — ABNORMAL LOW (ref 4.22–5.81)
RDW: 14.4 % (ref 11.5–15.5)
WBC: 6.5 10*3/uL (ref 4.0–10.5)

## 2014-07-01 LAB — ABO/RH: ABO/RH(D): O POS

## 2014-07-01 LAB — PREPARE RBC (CROSSMATCH)
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
## 2014-07-01 NOTE — Progress Notes (Signed)
LABS DRAWN

## 2014-07-02 ENCOUNTER — Encounter (HOSPITAL_BASED_OUTPATIENT_CLINIC_OR_DEPARTMENT_OTHER): Payer: Medicaid Other

## 2014-07-02 ENCOUNTER — Encounter (HOSPITAL_COMMUNITY): Payer: Self-pay

## 2014-07-02 DIAGNOSIS — C159 Malignant neoplasm of esophagus, unspecified: Secondary | ICD-10-CM

## 2014-07-02 MED ORDER — ACETAMINOPHEN 325 MG PO TABS
650.0000 mg | ORAL_TABLET | Freq: Once | ORAL | Status: AC
  Administered 2014-07-02: 650 mg via ORAL
  Filled 2014-07-02: qty 2

## 2014-07-02 MED ORDER — SODIUM CHLORIDE 0.9 % IV SOLN
250.0000 mL | Freq: Once | INTRAVENOUS | Status: AC
  Administered 2014-07-02: 250 mL via INTRAVENOUS

## 2014-07-02 MED ORDER — HEPARIN SOD (PORK) LOCK FLUSH 100 UNIT/ML IV SOLN
250.0000 [IU] | INTRAVENOUS | Status: AC | PRN
  Administered 2014-07-02: 500 [IU]
  Filled 2014-07-02: qty 5

## 2014-07-02 MED ORDER — SODIUM CHLORIDE 0.9 % IJ SOLN: 10.0000 mL | INTRAMUSCULAR | Status: DC | PRN

## 2014-07-02 MED ORDER — DIPHENHYDRAMINE HCL 25 MG PO CAPS
25.0000 mg | ORAL_CAPSULE | Freq: Once | ORAL | Status: AC
  Administered 2014-07-02: 25 mg via ORAL
  Filled 2014-07-02: qty 1

## 2014-07-02 NOTE — Progress Notes (Signed)
Anthony Cameron Tolerated 2 units of blood well Discharged ambulatory   24 hour follow up, pt states that he tolerated chemotherapy very well.  He is feeling better.

## 2014-07-02 NOTE — Patient Instructions (Signed)
[place] at [facility]  Discharge Instructions:  2 units of blood today Follow up as scheduled Please =call the clinic if you have nay questions or concerns _______________________________________________________________  Thank you for choosing [place] [facility] to provide your oncology and hematology care.  To afford each patient quality time with our providers, please arrive at least 15 minutes before your scheduled appointment.  You need to re-schedule your appointment if you arrive 10 or more minutes late.  We strive to give you quality time with our providers, and arriving late affects you and other patients whose appointments are after yours.  Also, if you no show three or more times for appointments you may be dismissed from the clinic.  Again, thank you for choosing [place] [facility] [place] hope is that these requests will allow you access to exceptional care and in a timely manner. _______________________________________________________________  If you have questions after your visit, please contact our office at [phone] between the hours of 8:30 a.m. and 5:00 p.m. Voicemails left after 4:30 p.m. will not be returned until the following business day. _______________________________________________________________  For prescription refill requests, have your pharmacy contact our office. _______________________________________________________________  Recommendations made by the consultant and any test results will be sent to your referring physician. _______________________________________________________________

## 2014-07-03 LAB — TYPE AND SCREEN
ABO/RH(D): O POS
Antibody Screen: NEGATIVE
UNIT DIVISION: 0
Unit division: 0

## 2014-07-04 NOTE — Progress Notes (Signed)
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 ensure the stability or resolution of these lesions, as metastatic lesions or primary bronchogenic lesions such as adenocarcinoma are not excluded. 4. Emphysema. 5. Atherosclerosis, including left anterior descending coronary artery disease. Please note that although the presence of coronary artery calcium documents the presence of coronary artery disease, the severity of this disease and any potential stenosis cannot be assessed on this non-gated CT examination. Assessment for potential risk factor modification, dietary therapy or pharmacologic therapy may be warranted, if clinically indicated. 6. There are calcifications of the aortic valve. Echocardiographic correlation for evaluation of potential valvular dysfunction may be warranted if clinically indicated.   Electronically Signed   By: Vinnie Langton M.D.   On: 06/11/2014 11:11   Ir Fluoro Guide Cv Line Right  06/27/2014   CLINICAL DATA:  Esophageal carcinoma  EXAM: TUNNEL POWER PORT PLACEMENT WITH SUBCUTANEOUS POCKET UTILIZING ULTRASOUND & FLOUROSCOPY  FLUOROSCOPY TIME:  3 minutes and 24 seconds.  MEDICATIONS AND MEDICAL HISTORY: Versed Three mg,  Fentanyl 50 mcg.  As antibiotic prophylaxis, Ancef was ordered pre-procedure and administered intravenously within one hour of incision.  ANESTHESIA/SEDATION: Moderate sedation time: 32 minutes  CONTRAST:  None  PROCEDURE: After written informed consent was obtained, patient was placed in the supine position on angiographic table. The right neck and chest was prepped and draped in a sterile fashion. Lidocaine was utilized for local anesthesia. The right internal jugular vein was noted to be patent initially with ultrasound. Under sonographic guidance, a micropuncture needle was inserted into the right IJ vein (Ultrasound and fluoroscopic image documentation was performed). The needle was removed over an 018 wire which was exchanged for a Amplatz. This was advanced into the IVC. An 8-French dilator was advanced over the Amplatz.  A small incision was made in the right upper chest over the anterior right second rib. Utilizing blunt dissection, a subcutaneous pocket was created in the caudal direction. The pocket was irrigated with a copious amount of sterile normal saline. The port catheter was tunneled from the chest incision, and out the neck incision. The reservoir was inserted into  the subcutaneous pocket and secured with two 3-0 Ethilon stitches. A peel-away sheath was advanced over the Amplatz wire. The port catheter was cut to measure length and inserted through the peel-away sheath. The peel-away sheath was removed. The chest incision was closed with 3-0 Vicryl interrupted stitches for the subcutaneous tissue and a running of 4-0 Vicryl subcuticular stitch for the skin. The neck incision was closed with a 4-0 Vicryl subcuticular stitch. Derma-bond was applied to both surgical incisions. The port reservoir was flushed and instilled with heparinized saline. No complications.  FINDINGS: A right IJ vein Port-A-Cath is in place with its tip at the cavoatrial junction.  COMPLICATIONS: None  IMPRESSION: Successful 8  French right internal jugular vein power port placement with its tip at the SVC/RA junction.   Electronically Signed   By: Marybelle Killings M.D.   On: 06/27/2014 11:51   Ir US Guide Vasc Access Right  06/27/2014   CLINICAL DATA:  Esophageal carcinoma  EXAM: TUNNEL POWER PORT PLACEMENT WITH SUBCUTANEOUS POCKET UTILIZING ULTRASOUND & FLOUROSCOPY  FLUOROSCOPY TIME:  3 minutes and 24 seconds.  MEDICATIONS AND MEDICAL HISTORY: Versed Three mg, Fentanyl 50 mcg.  As antibiotic prophylaxis, Ancef was ordered pre-procedure and administered intravenously within one hour of incision.  ANESTHESIA/SEDATION: Moderate sedation time: 32 minutes  CONTRAST:  None  PROCEDURE: After written informed consent was obtained, patient was placed in the supine position on angiographic table. The right neck and chest was prepped and draped in a sterile fashion. Lidocaine was utilized for local anesthesia. The right internal jugular vein was noted to be patent initially with ultrasound. Under sonographic guidance, a micropuncture needle was inserted into the right IJ vein (Ultrasound and fluoroscopic image documentation was performed). The needle was removed over an 018 wire which was exchanged for a Amplatz. This was advanced into the IVC. An 8-French dilator was advanced over the Amplatz.  A small incision was made in the right upper chest over the anterior right second rib. Utilizing blunt dissection, a subcutaneous pocket was created in the caudal direction. The pocket was irrigated with a copious amount of sterile normal saline. The port catheter was tunneled from the chest incision, and out the neck incision. The reservoir was inserted into the subcutaneous pocket and secured with two 3-0 Ethilon stitches. A peel-away sheath was advanced over the Amplatz wire. The port catheter was cut to measure length and inserted through the peel-away sheath. The peel-away sheath was removed. The chest incision was closed with 3-0 Vicryl interrupted  stitches for the subcutaneous tissue and a running of 4-0 Vicryl subcuticular stitch for the skin. The neck incision was closed with a 4-0 Vicryl subcuticular stitch. Derma-bond was applied to both surgical incisions. The port reservoir was flushed and instilled with heparinized saline. No complications.  FINDINGS: A right IJ vein Port-A-Cath is in place with its tip at the cavoatrial junction.  COMPLICATIONS: None  IMPRESSION: Successful 8 French right internal jugular vein power port placement with its tip at the SVC/RA junction.   Electronically Signed   By: Marybelle Killings M.D.   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  Saw Dr. Roxan Hockey on 06/28/2014 for consideration of future surgical resection following systemic therapy.  His assessment and plan are noted above in the body of today's note.  Pre-chemo labs  today.  I have sent a message to our nutritionist regarding the patient's request for more ensure.  Return in 1 week as scheduled for follow-up and treatment.  We will keep a close eye on his weight and if significant changes are noted he will need a G-tube for nutrition.     THERAPY PLAN:  Continue with treatment as planned.  All questions were answered. The patient knows to call the clinic with any problems, questions or concerns. We can certainly see the patient much sooner if necessary.  Patient and plan discussed with Dr. Ancil Linsey and she is in agreement with the aforementioned.   This note is electronically signed by: Doy Mince 07/05/2014 12:38 PM

## 2014-07-04 NOTE — Assessment & Plan Note (Addendum)
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  Saw Dr. Roxan Hockey on 06/28/2014 for consideration of future surgical resection following systemic therapy.  His assessment and plan are noted above in the body of today's note.  Pre-chemo labs today.  I have sent a message to our nutritionist regarding the patient's request for more ensure.  Return in 1 week as scheduled for follow-up and treatment.  We will keep a close eye on his weight and if significant changes are noted he will need a G-tube for nutrition.

## 2014-07-05 ENCOUNTER — Encounter (HOSPITAL_COMMUNITY): Payer: Self-pay

## 2014-07-05 ENCOUNTER — Encounter: Payer: Self-pay | Admitting: Dietician

## 2014-07-05 ENCOUNTER — Encounter (HOSPITAL_BASED_OUTPATIENT_CLINIC_OR_DEPARTMENT_OTHER): Payer: Medicaid Other

## 2014-07-05 ENCOUNTER — Encounter (HOSPITAL_BASED_OUTPATIENT_CLINIC_OR_DEPARTMENT_OTHER): Payer: Medicaid Other | Admitting: Oncology

## 2014-07-05 VITALS — BP 112/83 | HR 62 | Temp 98.4°F | Resp 18 | Wt 136.3 lb

## 2014-07-05 DIAGNOSIS — C159 Malignant neoplasm of esophagus, unspecified: Secondary | ICD-10-CM

## 2014-07-05 DIAGNOSIS — Z5111 Encounter for antineoplastic chemotherapy: Secondary | ICD-10-CM | POA: Diagnosis not present

## 2014-07-05 LAB — CBC WITH DIFFERENTIAL/PLATELET
BASOS PCT: 0 % (ref 0–1)
Basophils Absolute: 0 10*3/uL (ref 0.0–0.1)
EOS PCT: 1 % (ref 0–5)
Eosinophils Absolute: 0.1 10*3/uL (ref 0.0–0.7)
HEMATOCRIT: 32.3 % — AB (ref 39.0–52.0)
HEMOGLOBIN: 10.3 g/dL — AB (ref 13.0–17.0)
Lymphocytes Relative: 8 % — ABNORMAL LOW (ref 12–46)
Lymphs Abs: 0.5 10*3/uL — ABNORMAL LOW (ref 0.7–4.0)
MCH: 26.9 pg (ref 26.0–34.0)
MCHC: 31.9 g/dL (ref 30.0–36.0)
MCV: 84.3 fL (ref 78.0–100.0)
MONO ABS: 0.3 10*3/uL (ref 0.1–1.0)
MONOS PCT: 5 % (ref 3–12)
NEUTROS ABS: 5.3 10*3/uL (ref 1.7–7.7)
Neutrophils Relative %: 86 % — ABNORMAL HIGH (ref 43–77)
Platelets: 317 10*3/uL (ref 150–400)
RBC: 3.83 MIL/uL — ABNORMAL LOW (ref 4.22–5.81)
RDW: 14.5 % (ref 11.5–15.5)
WBC: 6.2 10*3/uL (ref 4.0–10.5)

## 2014-07-05 LAB — COMPREHENSIVE METABOLIC PANEL
ALT: 14 U/L — ABNORMAL LOW (ref 17–63)
AST: 15 U/L (ref 15–41)
Albumin: 3 g/dL — ABNORMAL LOW (ref 3.5–5.0)
Alkaline Phosphatase: 69 U/L (ref 38–126)
Anion gap: 7 (ref 5–15)
BUN: 12 mg/dL (ref 6–20)
CALCIUM: 9 mg/dL (ref 8.9–10.3)
CO2: 28 mmol/L (ref 22–32)
CREATININE: 0.57 mg/dL — AB (ref 0.61–1.24)
Chloride: 98 mmol/L — ABNORMAL LOW (ref 101–111)
Glucose, Bld: 99 mg/dL (ref 65–99)
Potassium: 4.2 mmol/L (ref 3.5–5.1)
Sodium: 133 mmol/L — ABNORMAL LOW (ref 135–145)
Total Bilirubin: 0.5 mg/dL (ref 0.3–1.2)
Total Protein: 8 g/dL (ref 6.5–8.1)

## 2014-07-05 MED ORDER — FAMOTIDINE IN NACL 20-0.9 MG/50ML-% IV SOLN
INTRAVENOUS | Status: AC
  Filled 2014-07-05: qty 50

## 2014-07-05 MED ORDER — DIPHENHYDRAMINE HCL 50 MG/ML IJ SOLN
50.0000 mg | Freq: Once | INTRAMUSCULAR | Status: AC
  Administered 2014-07-05: 50 mg via INTRAVENOUS

## 2014-07-05 MED ORDER — SODIUM CHLORIDE 0.9 % IV SOLN
223.4000 mg | Freq: Once | INTRAVENOUS | Status: AC
  Administered 2014-07-05: 220 mg via INTRAVENOUS
  Filled 2014-07-05: qty 22

## 2014-07-05 MED ORDER — SODIUM CHLORIDE 0.9 % IV SOLN
Freq: Once | INTRAVENOUS | Status: AC
  Administered 2014-07-05: 11:00:00 via INTRAVENOUS
  Filled 2014-07-05: qty 8

## 2014-07-05 MED ORDER — PACLITAXEL CHEMO INJECTION 300 MG/50ML
50.0000 mg/m2 | Freq: Once | INTRAVENOUS | Status: AC
  Administered 2014-07-05: 90 mg via INTRAVENOUS
  Filled 2014-07-05: qty 15

## 2014-07-05 MED ORDER — FAMOTIDINE IN NACL 20-0.9 MG/50ML-% IV SOLN
20.0000 mg | Freq: Once | INTRAVENOUS | Status: AC
Start: 2014-07-05 — End: 2014-07-05
  Administered 2014-07-05: 20 mg via INTRAVENOUS

## 2014-07-05 MED ORDER — DIPHENHYDRAMINE HCL 50 MG/ML IJ SOLN
INTRAMUSCULAR | Status: AC
  Filled 2014-07-05: qty 1

## 2014-07-05 MED ORDER — SODIUM CHLORIDE 0.9 % IJ SOLN
10.0000 mL | INTRAMUSCULAR | Status: DC | PRN
  Administered 2014-07-05: 10 mL
  Filled 2014-07-05: qty 10

## 2014-07-05 MED ORDER — SODIUM CHLORIDE 0.9 % IV SOLN
Freq: Once | INTRAVENOUS | Status: AC
  Administered 2014-07-05: 11:00:00 via INTRAVENOUS

## 2014-07-05 MED ORDER — HEPARIN SOD (PORK) LOCK FLUSH 100 UNIT/ML IV SOLN
500.0000 [IU] | Freq: Once | INTRAVENOUS | Status: AC | PRN
  Administered 2014-07-05: 500 [IU]

## 2014-07-05 NOTE — Patient Instructions (Signed)
[facility] Discharge Instructions for Patients Receiving Chemotherapy  Today you received the following chemotherapy agents taxol and carbo Please follow up as scheduled Call the clinic if you have any questions or concerns  To help prevent nausea and vomiting after your treatment, we encourage you to take your nausea medication .   If you develop nausea and vomiting, or diarrhea that is not controlled by your medication, call the clinic.  The clinic phone number is [phone]. Office hours are Monday-Friday 8:30am-5:00pm.  BELOW ARE SYMPTOMS THAT SHOULD BE REPORTED IMMEDIATELY:  *FEVER GREATER THAN 101.0 F  *CHILLS WITH OR WITHOUT FEVER  NAUSEA AND VOMITING THAT IS NOT CONTROLLED WITH YOUR NAUSEA MEDICATION  *UNUSUAL SHORTNESS OF BREATH  *UNUSUAL BRUISING OR BLEEDING  TENDERNESS IN MOUTH AND THROAT WITH OR WITHOUT PRESENCE OF ULCERS  *URINARY PROBLEMS  *BOWEL PROBLEMS  UNUSUAL RASH Items with * indicate a potential emergency and should be followed up as soon as possible. If you have an emergency after office hours please contact your primary care physician or go to the nearest emergency department.  Please call the clinic during office hours if you have any questions or concerns.   You may also contact the Patient Navigator at [phone] should you have any questions or need assistance in obtaining follow up care. _____________________________________________________________________ Have you asked about our STAR program?    STAR stands for Survivorship Training and Rehabilitation, and this is a nationally recognized cancer care program that focuses on survivorship and rehabilitation.  Cancer and cancer treatments may cause problems, such as, pain, making you feel tired and keeping you from doing the things that you need or want to do. Cancer rehabilitation can help. Our goal is to reduce these troubling effects and help you have the best quality of life  possible.  You may receive a survey from a nurse that asks questions about your current state of health.  Based on the survey results, all eligible patients will be referred to the [facility] program for an evaluation so we can better serve you! A frequently asked questions sheet is available upon request.

## 2014-07-05 NOTE — Progress Notes (Signed)
Pt requested another Ensure Case. Will order one for his appointment next Friday.  Burtis Junes RD, LDN Nutrition Pager: [phone] 07/05/2014 2:56 PM

## 2014-07-05 NOTE — Progress Notes (Signed)
Anthony Cameron Tolerated chemotherapy well discharged ambulatory

## 2014-07-11 ENCOUNTER — Other Ambulatory Visit (HOSPITAL_COMMUNITY): Payer: Self-pay | Admitting: *Deleted

## 2014-07-11 ENCOUNTER — Telehealth (HOSPITAL_COMMUNITY): Payer: Self-pay | Admitting: *Deleted

## 2014-07-11 DIAGNOSIS — C159 Malignant neoplasm of esophagus, unspecified: Secondary | ICD-10-CM

## 2014-07-11 MED ORDER — HYDROCODONE-ACETAMINOPHEN 5-325 MG PO TABS: 1.0000 | ORAL_TABLET | ORAL | Status: DC | PRN

## 2014-07-12 ENCOUNTER — Encounter: Payer: Self-pay | Admitting: Dietician

## 2014-07-12 ENCOUNTER — Encounter (HOSPITAL_BASED_OUTPATIENT_CLINIC_OR_DEPARTMENT_OTHER): Payer: Medicaid Other | Admitting: Oncology

## 2014-07-12 ENCOUNTER — Encounter (HOSPITAL_BASED_OUTPATIENT_CLINIC_OR_DEPARTMENT_OTHER): Payer: Medicaid Other

## 2014-07-12 ENCOUNTER — Encounter (HOSPITAL_COMMUNITY): Payer: Self-pay | Admitting: Oncology

## 2014-07-12 VITALS — BP 110/56 | HR 76 | Temp 99.0°F | Resp 20

## 2014-07-12 VITALS — BP 99/60 | HR 69 | Temp 98.2°F | Resp 16 | Wt 134.4 lb

## 2014-07-12 DIAGNOSIS — C159 Malignant neoplasm of esophagus, unspecified: Secondary | ICD-10-CM

## 2014-07-12 DIAGNOSIS — Z5111 Encounter for antineoplastic chemotherapy: Secondary | ICD-10-CM | POA: Diagnosis not present

## 2014-07-12 LAB — CBC WITH DIFFERENTIAL/PLATELET
BASOS ABS: 0 10*3/uL (ref 0.0–0.1)
Basophils Relative: 0 % (ref 0–1)
Eosinophils Absolute: 0 10*3/uL (ref 0.0–0.7)
Eosinophils Relative: 1 % (ref 0–5)
HCT: 29.4 % — ABNORMAL LOW (ref 39.0–52.0)
Hemoglobin: 9.4 g/dL — ABNORMAL LOW (ref 13.0–17.0)
LYMPHS ABS: 0.2 10*3/uL — AB (ref 0.7–4.0)
Lymphocytes Relative: 7 % — ABNORMAL LOW (ref 12–46)
MCH: 26.9 pg (ref 26.0–34.0)
MCHC: 32 g/dL (ref 30.0–36.0)
MCV: 84 fL (ref 78.0–100.0)
Monocytes Absolute: 0.7 10*3/uL (ref 0.1–1.0)
Monocytes Relative: 33 % — ABNORMAL HIGH (ref 3–12)
NEUTROS PCT: 59 % (ref 43–77)
Neutro Abs: 1.3 10*3/uL — ABNORMAL LOW (ref 1.7–7.7)
Platelets: 192 10*3/uL (ref 150–400)
RBC: 3.5 MIL/uL — ABNORMAL LOW (ref 4.22–5.81)
RDW: 15.1 % (ref 11.5–15.5)
WBC: 2.2 10*3/uL — AB (ref 4.0–10.5)

## 2014-07-12 LAB — COMPREHENSIVE METABOLIC PANEL
ALK PHOS: 63 U/L (ref 38–126)
ALT: 17 U/L (ref 17–63)
ANION GAP: 8 (ref 5–15)
AST: 19 U/L (ref 15–41)
Albumin: 3 g/dL — ABNORMAL LOW (ref 3.5–5.0)
BILIRUBIN TOTAL: 0.5 mg/dL (ref 0.3–1.2)
BUN: 14 mg/dL (ref 6–20)
CO2: 26 mmol/L (ref 22–32)
Calcium: 8.7 mg/dL — ABNORMAL LOW (ref 8.9–10.3)
Chloride: 100 mmol/L — ABNORMAL LOW (ref 101–111)
Creatinine, Ser: 0.62 mg/dL (ref 0.61–1.24)
GFR calc Af Amer: >60 mL/min (ref >60)
GFR calc non Af Amer: >60 mL/min (ref >60)
Glucose, Bld: 79 mg/dL (ref 65–99)
POTASSIUM: 4 mmol/L (ref 3.5–5.1)
Sodium: 134 mmol/L — ABNORMAL LOW (ref 135–145)
Total Protein: 7.2 g/dL (ref 6.5–8.1)

## 2014-07-12 MED ORDER — SODIUM CHLORIDE 0.9 % IV SOLN
223.4000 mg | Freq: Once | INTRAVENOUS | Status: AC
  Administered 2014-07-12: 220 mg via INTRAVENOUS
  Filled 2014-07-12: qty 22

## 2014-07-12 MED ORDER — FAMOTIDINE IN NACL 20-0.9 MG/50ML-% IV SOLN
INTRAVENOUS | Status: AC
  Filled 2014-07-12: qty 50

## 2014-07-12 MED ORDER — SODIUM CHLORIDE 0.9 % IV SOLN
Freq: Once | INTRAVENOUS | Status: AC
  Administered 2014-07-12: 13:00:00 via INTRAVENOUS

## 2014-07-12 MED ORDER — PACLITAXEL CHEMO INJECTION 300 MG/50ML
50.0000 mg/m2 | Freq: Once | INTRAVENOUS | Status: AC
  Administered 2014-07-12: 90 mg via INTRAVENOUS
  Filled 2014-07-12: qty 15

## 2014-07-12 MED ORDER — SODIUM CHLORIDE 0.9 % IV SOLN
Freq: Once | INTRAVENOUS | Status: AC
  Administered 2014-07-12: 13:00:00 via INTRAVENOUS
  Filled 2014-07-12: qty 8

## 2014-07-12 MED ORDER — HEPARIN SOD (PORK) LOCK FLUSH 100 UNIT/ML IV SOLN
500.0000 [IU] | Freq: Once | INTRAVENOUS | Status: AC | PRN
  Administered 2014-07-12: 500 [IU]
  Filled 2014-07-12: qty 5

## 2014-07-12 MED ORDER — SODIUM CHLORIDE 0.9 % IJ SOLN
10.0000 mL | INTRAMUSCULAR | Status: DC | PRN
  Administered 2014-07-12: 10 mL
  Filled 2014-07-12: qty 10

## 2014-07-12 MED ORDER — FAMOTIDINE IN NACL 20-0.9 MG/50ML-% IV SOLN
20.0000 mg | Freq: Once | INTRAVENOUS | Status: AC
  Administered 2014-07-12: 20 mg via INTRAVENOUS

## 2014-07-12 MED ORDER — DIPHENHYDRAMINE HCL 50 MG/ML IJ SOLN
50.0000 mg | Freq: Once | INTRAMUSCULAR | Status: AC
  Administered 2014-07-12: 50 mg via INTRAVENOUS
  Filled 2014-07-12: qty 1
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
## 2014-07-12 NOTE — Assessment & Plan Note (Signed)
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  ANC is noted today.  Will start Neupogen daily x 4 days following each treatment.  This was built into the treatment plan.  Labs as ordered.  Will move treatment dates to Mondays to accommodate 4 days worth of Neupogen.  Nutritionist is following patient along.  Return in 1 week for follow-up.

## 2014-07-12 NOTE — Progress Notes (Signed)
F/u w/ pt during chemo session  Wt Readings from Last 10 Encounters:  07/12/14 134 lb 6.4 oz (60.963 kg)  07/05/14 136 lb 4.8 oz (61.825 kg)  07/01/14 131 lb (59.421 kg)  06/28/14 138 lb 3.2 oz (62.687 kg)  06/21/14 141 lb 9.6 oz (64.229 kg)  06/12/14 143 lb 12.8 oz (65.227 kg)  05/27/14 148 lb 9.6 oz (67.405 kg)  05/27/11 168 lb (76.204 kg)  09/20/07 184 lb (83.462 kg)  08/22/07 187 lb (84.823 kg)   Patient weight has slightly decreased in 1 week, but has gained in the past 2 weeks.  Pt reports that he is eating 3 meals a day with a snack, though he admits these are smaller meals.   Surprisingly, he notes no other symptoms besides loss of appetite. Denies n/v/c/d. He did shave some abdominal pain this morning, but that is not normal.   Again discussed high protein/calorie foods that he can eat instead of drinking the Ensure.   Praised pt on eating so well half way though his chemo sessions. Encouraged him to continue the good work.   Let him knew to contact me if he would like another case of Ensure, he is eligible for 1 more.   Burtis Junes RD, LDN Nutrition Pager: [phone] 07/12/2014 12:01 PM

## 2014-07-12 NOTE — Patient Instructions (Signed)
[place] at [facility] Discharge Instructions  RECOMMENDATIONS MADE BY THE CONSULTANT AND ANY TEST RESULTS WILL BE SENT TO YOUR REFERRING PHYSICIAN.  Tolerated taxol and carbo today.  neupogen given next 4 days Please follow up as scheduled Call the clinic if you have any questions or concerns  Thank you for choosing [place] at [facility] to provide your oncology and hematology care.  To afford each patient quality time with our provider, please arrive at least 15 minutes before your scheduled appointment time.    You need to re-schedule your appointment should you arrive 10 or more minutes late.  We strive to give you quality time with our providers, and arriving late affects you and other patients whose appointments are after yours.  Also, if you no show three or more times for appointments you may be dismissed from the clinic at the providers discretion.     Again, thank you for choosing [facility].  Our hope is that these requests will decrease the amount of time that you wait before being seen by our physicians.       _____________________________________________________________  Should you have questions after your visit to [facility], please contact our office at [phone] between the hours of 8:30 a.m. and 4:30 p.m.  Voicemails left after 4:30 p.m. will not be returned until the following business day.  For prescription refill requests, have your pharmacy contact our office.

## 2014-07-12 NOTE — Patient Instructions (Addendum)
[place] at [facility] Discharge Instructions  RECOMMENDATIONS MADE BY THE CONSULTANT AND ANY TEST RESULTS WILL BE SENT TO YOUR REFERRING PHYSICIAN.  Exam and discussion by Robynn Pane, PA-C. Will treat today and will give you neupogen injections daily x 4 days.  Saturday, Sunday, Monday Meet Mickie Kay in the main lobby each day at 2 pm. You can use your pain medication if you develop bone pain after the injections. Call with any concerns Chemotherapy and office visits as scheduled.  Thank you for choosing [place] at [facility] to provide your oncology and hematology care.  To afford each patient quality time with our provider, please arrive at least 15 minutes before your scheduled appointment time.    You need to re-schedule your appointment should you arrive 10 or more minutes late.  We strive to give you quality time with our providers, and arriving late affects you and other patients whose appointments are after yours.  Also, if you no show three or more times for appointments you may be dismissed from the clinic at the providers discretion.     Again, thank you for choosing [facility].  Our hope is that these requests will decrease the amount of time that you wait before being seen by our physicians.       _____________________________________________________________  Should you have questions after your visit to [facility], please contact our office at [phone] between the hours of 8:30 a.m. and 4:30 p.m.  Voicemails left after 4:30 p.m. will not be returned until the following business day.  For prescription refill requests, have your pharmacy contact our office.    Filgrastim, G-CSF injection What is this medicine? FILGRASTIM, G-CSF (fil GRA stim) is a granulocyte colony-stimulating factor that stimulates the growth of neutrophils, a type of white blood cell (WBC) important in the body's fight  against infection. It is used to reduce the incidence of fever and infection in patients with certain types of cancer who are receiving chemotherapy that affects the bone marrow, to stimulate blood cell production for removal of WBCs from the body prior to a bone marrow transplantation, to reduce the incidence of fever and infection in patients who have severe chronic neutropenia, and to improve survival outcomes following high-dose radiation exposure that is toxic to the bone marrow. This medicine may be used for other purposes; ask your health care provider or pharmacist if you have questions. COMMON BRAND NAME(S): Neupogen What should I tell my health care provider before I take this medicine? They need to know if you have any of these conditions: -latex allergy -ongoing radiation therapy -sickle cell disease -an unusual or allergic reaction to filgrastim, pegfilgrastim, other medicines, foods, dyes, or preservatives -pregnant or trying to get pregnant -breast-feeding How should I use this medicine? This medicine is for injection under the skin. If you get this medicine at home, you will be taught how to prepare and give this medicine. Refer to the Instructions for Use that come with your medication packaging. Use exactly as directed. Take your medicine at regular intervals. Do not take your medicine more often than directed. It is important that you put your used needles and syringes in a special sharps container. Do not put them in a trash can. If you do not have a sharps container, call your pharmacist or healthcare provider to get one. Talk to your pediatrician regarding the use of this medicine in children. While  this drug may be prescribed for children as young as 7 months for selected conditions, precautions do apply. Overdosage: If you think you have taken too much of this medicine contact a poison control center or emergency room at once. NOTE: This medicine is only for you. Do not share  this medicine with others. What if I miss a dose? It is important not to miss your dose. Call your doctor or health care professional if you miss a dose. What may interact with this medicine? This medicine may interact with the following medications: -medicines that may cause a release of neutrophils, such as lithium This list may not describe all possible interactions. Give your health care provider a list of all the medicines, herbs, non-prescription drugs, or dietary supplements you use. Also tell them if you smoke, drink alcohol, or use illegal drugs. Some items may interact with your medicine. What should I watch for while using this medicine? You may need blood work done while you are taking this medicine. What side effects may I notice from receiving this medicine? Side effects that you should report to your doctor or health care professional as soon as possible: -allergic reactions like skin rash, itching or hives, swelling of the face, lips, or tongue -dizziness or feeling faint -fever -pain, redness, or irritation at site where injected -pinpoint red spots on the skin -shortness of breath or breathing problems -stomach or side pain, or pain at the shoulder -swelling -tiredness -trouble passing urine -unusual bleeding or bruising Side effects that usually do not require medical attention (report to your doctor or health care professional if they continue or are bothersome): -bone pain -headache -muscle pain This list may not describe all possible side effects. Call your doctor for medical advice about side effects. You may report side effects to FDA at 1-800-FDA-1088. Where should I keep my medicine? Keep out of the reach of children. Store in a refrigerator between 2 and 8 degrees C (36 and 46 degrees F). Do not freeze. Keep in carton to protect from light. Throw away this medicine if vials or syringes are left out of the refrigerator for more than 24 hours. Throw away any  unused medicine after the expiration date. NOTE: This sheet is a summary. It may not cover all possible information. If you have questions about this medicine, talk to your doctor, pharmacist, or health care provider.  2015, Elsevier/[employer] Standard. (2013-05-18 17:00:01)

## 2014-07-12 NOTE — Progress Notes (Signed)
Anthony Cameron Tolerated chemotherapy well today Discharged ambulatory

## 2014-07-13 ENCOUNTER — Encounter (HOSPITAL_COMMUNITY): Payer: Self-pay

## 2014-07-13 ENCOUNTER — Encounter (HOSPITAL_BASED_OUTPATIENT_CLINIC_OR_DEPARTMENT_OTHER): Payer: Medicaid Other

## 2014-07-13 VITALS — BP 101/57 | HR 69 | Temp 98.0°F | Resp 16

## 2014-07-13 DIAGNOSIS — D701 Agranulocytosis secondary to cancer chemotherapy: Secondary | ICD-10-CM | POA: Diagnosis not present

## 2014-07-13 DIAGNOSIS — Z5189 Encounter for other specified aftercare: Secondary | ICD-10-CM

## 2014-07-13 DIAGNOSIS — C159 Malignant neoplasm of esophagus, unspecified: Secondary | ICD-10-CM | POA: Diagnosis not present

## 2014-07-13 MED ORDER — FILGRASTIM 480 MCG/0.8ML IJ SOSY
480.0000 ug | PREFILLED_SYRINGE | Freq: Once | INTRAMUSCULAR | Status: AC
  Administered 2014-07-13: 480 ug via SUBCUTANEOUS
  Filled 2014-07-13: qty 0.8

## 2014-07-13 NOTE — Progress Notes (Signed)
Anthony Cameron presents today for injection per MD orders. Neupogen 480 mg administered SQ in left Abdomen. Administration without incident. Patient tolerated well.

## 2014-07-14 ENCOUNTER — Encounter (HOSPITAL_BASED_OUTPATIENT_CLINIC_OR_DEPARTMENT_OTHER): Payer: Medicaid Other

## 2014-07-14 VITALS — BP 101/53 | HR 74 | Temp 97.7°F | Resp 16

## 2014-07-14 DIAGNOSIS — D701 Agranulocytosis secondary to cancer chemotherapy: Secondary | ICD-10-CM

## 2014-07-14 DIAGNOSIS — C159 Malignant neoplasm of esophagus, unspecified: Secondary | ICD-10-CM

## 2014-07-14 MED ORDER — FILGRASTIM 480 MCG/0.8ML IJ SOSY
480.0000 ug | PREFILLED_SYRINGE | Freq: Once | INTRAMUSCULAR | Status: AC
Start: 2014-07-14 — End: 2014-07-14
  Administered 2014-07-14: 480 ug via SUBCUTANEOUS

## 2014-07-14 NOTE — Progress Notes (Unsigned)
Anthony Cameron presents today for injection per MD orders. Neupogen 470mg administered SQ in right Abdomen. Administration without incident. Patient tolerated well.

## 2014-07-15 ENCOUNTER — Encounter (HOSPITAL_BASED_OUTPATIENT_CLINIC_OR_DEPARTMENT_OTHER): Payer: Medicaid Other

## 2014-07-15 VITALS — BP 109/61 | HR 81 | Temp 98.2°F | Resp 18

## 2014-07-15 DIAGNOSIS — C159 Malignant neoplasm of esophagus, unspecified: Secondary | ICD-10-CM | POA: Diagnosis present

## 2014-07-15 DIAGNOSIS — D701 Agranulocytosis secondary to cancer chemotherapy: Secondary | ICD-10-CM

## 2014-07-15 DIAGNOSIS — Z5189 Encounter for other specified aftercare: Secondary | ICD-10-CM

## 2014-07-15 MED ORDER — FILGRASTIM 480 MCG/0.8ML IJ SOSY
480.0000 ug | PREFILLED_SYRINGE | Freq: Once | INTRAMUSCULAR | Status: AC
  Administered 2014-07-15: 480 ug via SUBCUTANEOUS
  Filled 2014-07-15: qty 0.8

## 2014-07-15 NOTE — Progress Notes (Signed)
..  Anthony Cameron presents today for injection per the provider's orders.  neupogen administration without incident; see MAR for injection details.  Patient tolerated procedure well and without incident.  No questions or complaints noted at this time.

## 2014-07-16 ENCOUNTER — Encounter (HOSPITAL_COMMUNITY): Payer: Self-pay

## 2014-07-16 ENCOUNTER — Encounter (HOSPITAL_BASED_OUTPATIENT_CLINIC_OR_DEPARTMENT_OTHER): Payer: Medicaid Other

## 2014-07-16 VITALS — BP 102/58 | HR 76 | Temp 98.3°F | Resp 18

## 2014-07-16 DIAGNOSIS — C159 Malignant neoplasm of esophagus, unspecified: Secondary | ICD-10-CM | POA: Diagnosis present

## 2014-07-16 DIAGNOSIS — Z5189 Encounter for other specified aftercare: Secondary | ICD-10-CM

## 2014-07-16 MED ORDER — FILGRASTIM 480 MCG/1.6ML IJ SOLN
480.0000 ug | Freq: Once | INTRAMUSCULAR | Status: AC
Start: 2014-07-16 — End: 2014-07-16
  Administered 2014-07-16: 480 ug via SUBCUTANEOUS
  Filled 2014-07-16: qty 1.6

## 2014-07-16 MED FILL — Filgrastim Inj 480 MCG/1.6ML (300 MCG/ML): INTRAMUSCULAR | Qty: 1.6 | Status: AC

## 2014-07-16 NOTE — Progress Notes (Signed)
..  Anthony Cameron presents today for injection per the provider's orders.  neupogen administration without incident; see MAR for injection details.  Patient tolerated procedure well and without incident.  No questions or complaints noted at this time.

## 2014-07-19 ENCOUNTER — Ambulatory Visit (HOSPITAL_COMMUNITY): Payer: Self-pay | Admitting: Oncology

## 2014-07-19 ENCOUNTER — Inpatient Hospital Stay (HOSPITAL_COMMUNITY): Payer: Self-pay

## 2014-07-19 ENCOUNTER — Ambulatory Visit (HOSPITAL_COMMUNITY): Payer: Self-pay | Admitting: Hematology & Oncology

## 2014-07-22 ENCOUNTER — Encounter (HOSPITAL_COMMUNITY): Payer: Medicaid Other | Attending: Hematology & Oncology

## 2014-07-22 ENCOUNTER — Encounter (HOSPITAL_COMMUNITY): Payer: Self-pay

## 2014-07-22 VITALS — BP 108/60 | HR 68 | Temp 98.1°F | Resp 20 | Wt 136.7 lb

## 2014-07-22 DIAGNOSIS — C159 Malignant neoplasm of esophagus, unspecified: Secondary | ICD-10-CM | POA: Diagnosis not present

## 2014-07-22 DIAGNOSIS — Z5111 Encounter for antineoplastic chemotherapy: Secondary | ICD-10-CM

## 2014-07-22 LAB — CBC WITH DIFFERENTIAL/PLATELET
Basophils Absolute: 0 10*3/uL (ref 0.0–0.1)
Basophils Relative: 1 % (ref 0–1)
EOS ABS: 0 10*3/uL (ref 0.0–0.7)
EOS PCT: 1 % (ref 0–5)
HEMATOCRIT: 31.7 % — AB (ref 39.0–52.0)
HEMOGLOBIN: 10 g/dL — AB (ref 13.0–17.0)
Lymphocytes Relative: 8 % — ABNORMAL LOW (ref 12–46)
Lymphs Abs: 0.3 10*3/uL — ABNORMAL LOW (ref 0.7–4.0)
MCH: 26.8 pg (ref 26.0–34.0)
MCHC: 31.5 g/dL (ref 30.0–36.0)
MCV: 85 fL (ref 78.0–100.0)
MONO ABS: 0.6 10*3/uL (ref 0.1–1.0)
Monocytes Relative: 17 % — ABNORMAL HIGH (ref 3–12)
Neutro Abs: 2.7 10*3/uL (ref 1.7–7.7)
Neutrophils Relative %: 73 % (ref 43–77)
Platelets: 163 10*3/uL (ref 150–400)
RBC: 3.73 MIL/uL — AB (ref 4.22–5.81)
RDW: 15.7 % — ABNORMAL HIGH (ref 11.5–15.5)
WBC: 3.7 10*3/uL — AB (ref 4.0–10.5)

## 2014-07-22 LAB — COMPREHENSIVE METABOLIC PANEL
ALT: 14 U/L — ABNORMAL LOW (ref 17–63)
AST: 15 U/L (ref 15–41)
Albumin: 3.2 g/dL — ABNORMAL LOW (ref 3.5–5.0)
Alkaline Phosphatase: 64 U/L (ref 38–126)
Anion gap: 8 (ref 5–15)
BUN: 8 mg/dL (ref 6–20)
CALCIUM: 8.8 mg/dL — AB (ref 8.9–10.3)
CO2: 28 mmol/L (ref 22–32)
Chloride: 100 mmol/L — ABNORMAL LOW (ref 101–111)
Creatinine, Ser: 0.57 mg/dL — ABNORMAL LOW (ref 0.61–1.24)
GFR calc Af Amer: >60 mL/min (ref >60)
GFR calc non Af Amer: >60 mL/min (ref >60)
Glucose, Bld: 95 mg/dL (ref 65–99)
POTASSIUM: 3.9 mmol/L (ref 3.5–5.1)
SODIUM: 136 mmol/L (ref 135–145)
Total Bilirubin: 0.4 mg/dL (ref 0.3–1.2)
Total Protein: 7.1 g/dL (ref 6.5–8.1)

## 2014-07-22 MED ORDER — DIPHENHYDRAMINE HCL 50 MG/ML IJ SOLN
INTRAMUSCULAR | Status: AC
  Filled 2014-07-22: qty 1

## 2014-07-22 MED ORDER — HEPARIN SOD (PORK) LOCK FLUSH 100 UNIT/ML IV SOLN
500.0000 [IU] | Freq: Once | INTRAVENOUS | Status: AC | PRN
  Administered 2014-07-22: 500 [IU]
  Filled 2014-07-22: qty 5

## 2014-07-22 MED ORDER — FAMOTIDINE IN NACL 20-0.9 MG/50ML-% IV SOLN
INTRAVENOUS | Status: AC
  Filled 2014-07-22: qty 50

## 2014-07-22 MED ORDER — PACLITAXEL CHEMO INJECTION 300 MG/50ML
50.0000 mg/m2 | Freq: Once | INTRAVENOUS | Status: AC
  Administered 2014-07-22: 90 mg via INTRAVENOUS
  Filled 2014-07-22: qty 15

## 2014-07-22 MED ORDER — SODIUM CHLORIDE 0.9 % IV SOLN
Freq: Once | INTRAVENOUS | Status: AC
  Administered 2014-07-22: 11:00:00 via INTRAVENOUS
  Filled 2014-07-22: qty 8

## 2014-07-22 MED ORDER — SODIUM CHLORIDE 0.9 % IV SOLN
Freq: Once | INTRAVENOUS | Status: AC
  Administered 2014-07-22: 10:00:00 via INTRAVENOUS

## 2014-07-22 MED ORDER — DIPHENHYDRAMINE HCL 50 MG/ML IJ SOLN
50.0000 mg | Freq: Once | INTRAMUSCULAR | Status: AC
  Administered 2014-07-22: 50 mg via INTRAVENOUS

## 2014-07-22 MED ORDER — SODIUM CHLORIDE 0.9 % IJ SOLN
10.0000 mL | INTRAMUSCULAR | Status: DC | PRN
  Administered 2014-07-22: 10 mL
  Filled 2014-07-22: qty 10

## 2014-07-22 MED ORDER — CARBOPLATIN CHEMO INJECTION 450 MG/45ML
223.4000 mg | Freq: Once | INTRAVENOUS | Status: AC
  Administered 2014-07-22: 220 mg via INTRAVENOUS
  Filled 2014-07-22: qty 22

## 2014-07-22 MED ORDER — FAMOTIDINE IN NACL 20-0.9 MG/50ML-% IV SOLN
20.0000 mg | Freq: Once | INTRAVENOUS | Status: AC
  Administered 2014-07-22: 20 mg via INTRAVENOUS

## 2014-07-22 NOTE — Patient Instructions (Signed)
[facility] Discharge Instructions for Patients Receiving Chemotherapy  Today you received the following chemotherapy agents taxol and carbo Please follow up as scheduled Call the clinic if you have any questions or concerns  To help prevent nausea and vomiting after your treatment, we encourage you to take your nausea medication .   If you develop nausea and vomiting, or diarrhea that is not controlled by your medication, call the clinic.  The clinic phone number is [phone]. Office hours are Monday-Friday 8:30am-5:00pm.  BELOW ARE SYMPTOMS THAT SHOULD BE REPORTED IMMEDIATELY:  *FEVER GREATER THAN 101.0 F  *CHILLS WITH OR WITHOUT FEVER  NAUSEA AND VOMITING THAT IS NOT CONTROLLED WITH YOUR NAUSEA MEDICATION  *UNUSUAL SHORTNESS OF BREATH  *UNUSUAL BRUISING OR BLEEDING  TENDERNESS IN MOUTH AND THROAT WITH OR WITHOUT PRESENCE OF ULCERS  *URINARY PROBLEMS  *BOWEL PROBLEMS  UNUSUAL RASH Items with * indicate a potential emergency and should be followed up as soon as possible. If you have an emergency after office hours please contact your primary care physician or go to the nearest emergency department.  Please call the clinic during office hours if you have any questions or concerns.   You may also contact the Patient Navigator at [phone] should you have any questions or need assistance in obtaining follow up care. _____________________________________________________________________ Have you asked about our STAR program?    STAR stands for Survivorship Training and Rehabilitation, and this is a nationally recognized cancer care program that focuses on survivorship and rehabilitation.  Cancer and cancer treatments may cause problems, such as, pain, making you feel tired and keeping you from doing the things that you need or want to do. Cancer rehabilitation can help. Our goal is to reduce these troubling effects and help you have the best quality of life  possible.  You may receive a survey from a nurse that asks questions about your current state of health.  Based on the survey results, all eligible patients will be referred to the [facility] program for an evaluation so we can better serve you! A frequently asked questions sheet is available upon request.      Paclitaxel injection What is this medicine? PACLITAXEL (PAK li TAX el) is a chemotherapy drug. It targets fast dividing cells, like cancer cells, and causes these cells to die. This medicine is used to treat ovarian cancer, breast cancer, and other cancers. This medicine may be used for other purposes; ask your health care provider or pharmacist if you have questions. COMMON BRAND NAME(S): Onxol, Taxol What should I tell my health care provider before I take this medicine? They need to know if you have any of these conditions: -blood disorders -irregular heartbeat -infection (especially a virus infection such as chickenpox, cold sores, or herpes) -liver disease -previous or ongoing radiation therapy -an unusual or allergic reaction to paclitaxel, alcohol, polyoxyethylated castor oil, other chemotherapy agents, other medicines, foods, dyes, or preservatives -pregnant or trying to get pregnant -breast-feeding How should I use this medicine? This drug is given as an infusion into a vein. It is administered in a hospital or clinic by a specially trained health care professional. Talk to your pediatrician regarding the use of this medicine in children. Special care may be needed. Overdosage: If you think you have taken too much of this medicine contact a poison control center or emergency room at once. NOTE: This medicine is only for you. Do not share this medicine with others. What if I miss a  dose? It is important not to miss your dose. Call your doctor or health care professional if you are unable to keep an appointment. What may interact with this medicine? Do not take this medicine  with any of the following medications: -disulfiram -metronidazole This medicine may also interact with the following medications: -cyclosporine -diazepam -ketoconazole -medicines to increase blood counts like filgrastim, pegfilgrastim, sargramostim -other chemotherapy drugs like cisplatin, doxorubicin, epirubicin, etoposide, teniposide, vincristine -quinidine -testosterone -vaccines -verapamil Talk to your doctor or health care professional before taking any of these medicines: -acetaminophen -aspirin -ibuprofen -ketoprofen -naproxen This list may not describe all possible interactions. Give your health care provider a list of all the medicines, herbs, non-prescription drugs, or dietary supplements you use. Also tell them if you smoke, drink alcohol, or use illegal drugs. Some items may interact with your medicine. What should I watch for while using this medicine? Your condition will be monitored carefully while you are receiving this medicine. You will need important blood work done while you are taking this medicine. This drug may make you feel generally unwell. This is not uncommon, as chemotherapy can affect healthy cells as well as cancer cells. Report any side effects. Continue your course of treatment even though you feel ill unless your doctor tells you to stop. In some cases, you may be given additional medicines to help with side effects. Follow all directions for their use. Call your doctor or health care professional for advice if you get a fever, chills or sore throat, or other symptoms of a cold or flu. Do not treat yourself. This drug decreases your body's ability to fight infections. Try to avoid being around people who are sick. This medicine may increase your risk to bruise or bleed. Call your doctor or health care professional if you notice any unusual bleeding. Be careful brushing and flossing your teeth or using a toothpick because you may get an infection or bleed  more easily. If you have any dental work done, tell your dentist you are receiving this medicine. Avoid taking products that contain aspirin, acetaminophen, ibuprofen, naproxen, or ketoprofen unless instructed by your doctor. These medicines may hide a fever. Do not become pregnant while taking this medicine. Women should inform their doctor if they wish to become pregnant or think they might be pregnant. There is a potential for serious side effects to an unborn child. Talk to your health care professional or pharmacist for more information. Do not breast-feed an infant while taking this medicine. Men are advised not to father a child while receiving this medicine. What side effects may I notice from receiving this medicine? Side effects that you should report to your doctor or health care professional as soon as possible: -allergic reactions like skin rash, itching or hives, swelling of the face, lips, or tongue -low blood counts - This drug may decrease the number of white blood cells, red blood cells and platelets. You may be at increased risk for infections and bleeding. -signs of infection - fever or chills, cough, sore throat, pain or difficulty passing urine -signs of decreased platelets or bleeding - bruising, pinpoint red spots on the skin, black, tarry stools, nosebleeds -signs of decreased red blood cells - unusually weak or tired, fainting spells, lightheadedness -breathing problems -chest pain -high or low blood pressure -mouth sores -nausea and vomiting -pain, swelling, redness or irritation at the injection site -pain, tingling, numbness in the hands or feet -slow or irregular heartbeat -swelling of the ankle, feet, hands  Side effects that usually do not require medical attention (report to your doctor or health care professional if they continue or are bothersome): -bone pain -complete hair loss including hair on your head, underarms, pubic hair, eyebrows, and  eyelashes -changes in the color of fingernails -diarrhea -loosening of the fingernails -loss of appetite -muscle or joint pain -red flush to skin -sweating This list may not describe all possible side effects. Call your doctor for medical advice about side effects. You may report side effects to FDA at 1-800-FDA-1088. Where should I keep my medicine? This drug is given in a hospital or clinic and will not be stored at home. NOTE: This sheet is a summary. It may not cover all possible information. If you have questions about this medicine, talk to your doctor, pharmacist, or health care provider.  2015, Elsevier/Gold Standard. (2012-03-27 16:41:21)   Carboplatin injection What is this medicine? CARBOPLATIN (KAR boe pla tin) is a chemotherapy drug. It targets fast dividing cells, like cancer cells, and causes these cells to die. This medicine is used to treat ovarian cancer and many other cancers. This medicine may be used for other purposes; ask your health care provider or pharmacist if you have questions. COMMON BRAND NAME(S): Paraplatin What should I tell my health care provider before I take this medicine? They need to know if you have any of these conditions: -blood disorders -hearing problems -kidney disease -recent or ongoing radiation therapy -an unusual or allergic reaction to carboplatin, cisplatin, other chemotherapy, other medicines, foods, dyes, or preservatives -pregnant or trying to get pregnant -breast-feeding How should I use this medicine? This drug is usually given as an infusion into a vein. It is administered in a hospital or clinic by a specially trained health care professional. Talk to your pediatrician regarding the use of this medicine in children. Special care may be needed. Overdosage: If you think you have taken too much of this medicine contact a poison control center or emergency room at once. NOTE: This medicine is only for you. Do not share this  medicine with others. What if I miss a dose? It is important not to miss a dose. Call your doctor or health care professional if you are unable to keep an appointment. What may interact with this medicine? -medicines for seizures -medicines to increase blood counts like filgrastim, pegfilgrastim, sargramostim -some antibiotics like amikacin, gentamicin, neomycin, streptomycin, tobramycin -vaccines Talk to your doctor or health care professional before taking any of these medicines: -acetaminophen -aspirin -ibuprofen -ketoprofen -naproxen This list may not describe all possible interactions. Give your health care provider a list of all the medicines, herbs, non-prescription drugs, or dietary supplements you use. Also tell them if you smoke, drink alcohol, or use illegal drugs. Some items may interact with your medicine. What should I watch for while using this medicine? Your condition will be monitored carefully while you are receiving this medicine. You will need important blood work done while you are taking this medicine. This drug may make you feel generally unwell. This is not uncommon, as chemotherapy can affect healthy cells as well as cancer cells. Report any side effects. Continue your course of treatment even though you feel ill unless your doctor tells you to stop. In some cases, you may be given additional medicines to help with side effects. Follow all directions for their use. Call your doctor or health care professional for advice if you get a fever, chills or sore throat, or other symptoms of a cold  or flu. Do not treat yourself. This drug decreases your body's ability to fight infections. Try to avoid being around people who are sick. This medicine may increase your risk to bruise or bleed. Call your doctor or health care professional if you notice any unusual bleeding. Be careful brushing and flossing your teeth or using a toothpick because you may get an infection or bleed more  easily. If you have any dental work done, tell your dentist you are receiving this medicine. Avoid taking products that contain aspirin, acetaminophen, ibuprofen, naproxen, or ketoprofen unless instructed by your doctor. These medicines may hide a fever. Do not become pregnant while taking this medicine. Women should inform their doctor if they wish to become pregnant or think they might be pregnant. There is a potential for serious side effects to an unborn child. Talk to your health care professional or pharmacist for more information. Do not breast-feed an infant while taking this medicine. What side effects may I notice from receiving this medicine? Side effects that you should report to your doctor or health care professional as soon as possible: -allergic reactions like skin rash, itching or hives, swelling of the face, lips, or tongue -signs of infection - fever or chills, cough, sore throat, pain or difficulty passing urine -signs of decreased platelets or bleeding - bruising, pinpoint red spots on the skin, black, tarry stools, nosebleeds -signs of decreased red blood cells - unusually weak or tired, fainting spells, lightheadedness -breathing problems -changes in hearing -changes in vision -chest pain -high blood pressure -low blood counts - This drug may decrease the number of white blood cells, red blood cells and platelets. You may be at increased risk for infections and bleeding. -nausea and vomiting -pain, swelling, redness or irritation at the injection site -pain, tingling, numbness in the hands or feet -problems with balance, talking, walking -trouble passing urine or change in the amount of urine Side effects that usually do not require medical attention (report to your doctor or health care professional if they continue or are bothersome): -hair loss -loss of appetite -metallic taste in the mouth or changes in taste This list may not describe all possible side effects.  Call your doctor for medical advice about side effects. You may report side effects to FDA at 1-800-FDA-1088. Where should I keep my medicine? This drug is given in a hospital or clinic and will not be stored at home. NOTE: This sheet is a summary. It may not cover all possible information. If you have questions about this medicine, talk to your doctor, pharmacist, or health care provider.  2015, Elsevier/Gold Standard. (2007-05-09 14:38:05)

## 2014-07-22 NOTE — Progress Notes (Signed)
Anthony Cameron Tolerated chemotherapy well. Discharged ambulatory

## 2014-07-26 ENCOUNTER — Ambulatory Visit (HOSPITAL_COMMUNITY): Payer: Self-pay | Admitting: Oncology

## 2014-07-26 ENCOUNTER — Inpatient Hospital Stay (HOSPITAL_COMMUNITY): Payer: Self-pay

## 2014-07-29 ENCOUNTER — Encounter (HOSPITAL_BASED_OUTPATIENT_CLINIC_OR_DEPARTMENT_OTHER): Payer: Medicaid Other

## 2014-07-29 ENCOUNTER — Encounter (HOSPITAL_COMMUNITY): Payer: Self-pay | Admitting: Hematology & Oncology

## 2014-07-29 ENCOUNTER — Encounter (HOSPITAL_BASED_OUTPATIENT_CLINIC_OR_DEPARTMENT_OTHER): Payer: Medicaid Other | Admitting: Hematology & Oncology

## 2014-07-29 VITALS — BP 99/60 | HR 61 | Temp 98.2°F | Resp 20

## 2014-07-29 VITALS — BP 97/56 | HR 59 | Temp 98.0°F | Resp 16 | Wt 134.4 lb

## 2014-07-29 DIAGNOSIS — G893 Neoplasm related pain (acute) (chronic): Secondary | ICD-10-CM | POA: Diagnosis not present

## 2014-07-29 DIAGNOSIS — R1310 Dysphagia, unspecified: Secondary | ICD-10-CM

## 2014-07-29 DIAGNOSIS — C159 Malignant neoplasm of esophagus, unspecified: Secondary | ICD-10-CM

## 2014-07-29 DIAGNOSIS — Z5111 Encounter for antineoplastic chemotherapy: Secondary | ICD-10-CM | POA: Diagnosis present

## 2014-07-29 DIAGNOSIS — R634 Abnormal weight loss: Secondary | ICD-10-CM | POA: Diagnosis not present

## 2014-07-29 LAB — CBC WITH DIFFERENTIAL/PLATELET
BASOS PCT: 1 % (ref 0–1)
Basophils Absolute: 0 10*3/uL (ref 0.0–0.1)
Eosinophils Absolute: 0.1 10*3/uL (ref 0.0–0.7)
Eosinophils Relative: 5 % (ref 0–5)
HCT: 31.4 % — ABNORMAL LOW (ref 39.0–52.0)
Hemoglobin: 10.1 g/dL — ABNORMAL LOW (ref 13.0–17.0)
LYMPHS PCT: 12 % (ref 12–46)
Lymphs Abs: 0.3 10*3/uL — ABNORMAL LOW (ref 0.7–4.0)
MCH: 26.9 pg (ref 26.0–34.0)
MCHC: 32.2 g/dL (ref 30.0–36.0)
MCV: 83.5 fL (ref 78.0–100.0)
MONO ABS: 0.2 10*3/uL (ref 0.1–1.0)
Monocytes Relative: 10 % (ref 3–12)
Neutro Abs: 1.8 10*3/uL (ref 1.7–7.7)
Neutrophils Relative %: 72 % (ref 43–77)
PLATELETS: 149 10*3/uL — AB (ref 150–400)
RBC: 3.76 MIL/uL — ABNORMAL LOW (ref 4.22–5.81)
RDW: 16.3 % — AB (ref 11.5–15.5)
WBC: 2.5 10*3/uL — AB (ref 4.0–10.5)

## 2014-07-29 LAB — COMPREHENSIVE METABOLIC PANEL
ALBUMIN: 3.4 g/dL — AB (ref 3.5–5.0)
ALT: 15 U/L — ABNORMAL LOW (ref 17–63)
AST: 18 U/L (ref 15–41)
Alkaline Phosphatase: 61 U/L (ref 38–126)
Anion gap: 7 (ref 5–15)
BUN: 11 mg/dL (ref 6–20)
CHLORIDE: 100 mmol/L — AB (ref 101–111)
CO2: 28 mmol/L (ref 22–32)
CREATININE: 0.61 mg/dL (ref 0.61–1.24)
Calcium: 8.9 mg/dL (ref 8.9–10.3)
GFR calc Af Amer: >60 mL/min (ref >60)
Glucose, Bld: 85 mg/dL (ref 65–99)
Potassium: 3.9 mmol/L (ref 3.5–5.1)
Sodium: 135 mmol/L (ref 135–145)
Total Bilirubin: 0.4 mg/dL (ref 0.3–1.2)
Total Protein: 7.2 g/dL (ref 6.5–8.1)

## 2014-07-29 MED ORDER — SODIUM CHLORIDE 0.9 % IV SOLN
223.4000 mg | Freq: Once | INTRAVENOUS | Status: AC
  Administered 2014-07-29: 220 mg via INTRAVENOUS
  Filled 2014-07-29: qty 22

## 2014-07-29 MED ORDER — DEXAMETHASONE SODIUM PHOSPHATE 100 MG/10ML IJ SOLN
Freq: Once | INTRAMUSCULAR | Status: AC
  Administered 2014-07-29: 11:00:00 via INTRAVENOUS
  Filled 2014-07-29: qty 8

## 2014-07-29 MED ORDER — MORPHINE SULFATE ER 15 MG PO TBCR: 15.0000 mg | EXTENDED_RELEASE_TABLET | Freq: Two times a day (BID) | ORAL | Status: DC

## 2014-07-29 MED ORDER — DEXTROSE 5 % IV SOLN
50.0000 mg/m2 | Freq: Once | INTRAVENOUS | Status: AC
  Administered 2014-07-29: 90 mg via INTRAVENOUS
  Filled 2014-07-29: qty 15

## 2014-07-29 MED ORDER — DIPHENHYDRAMINE HCL 50 MG/ML IJ SOLN
INTRAMUSCULAR | Status: AC
  Filled 2014-07-29: qty 1

## 2014-07-29 MED ORDER — HEPARIN SOD (PORK) LOCK FLUSH 100 UNIT/ML IV SOLN
INTRAVENOUS | Status: AC
  Filled 2014-07-29: qty 5

## 2014-07-29 MED ORDER — FAMOTIDINE IN NACL 20-0.9 MG/50ML-% IV SOLN
20.0000 mg | Freq: Once | INTRAVENOUS | Status: AC
  Administered 2014-07-29: 20 mg via INTRAVENOUS

## 2014-07-29 MED ORDER — HEPARIN SOD (PORK) LOCK FLUSH 100 UNIT/ML IV SOLN
500.0000 [IU] | Freq: Once | INTRAVENOUS | Status: AC | PRN
  Administered 2014-07-29: 500 [IU]

## 2014-07-29 MED ORDER — DIPHENHYDRAMINE HCL 50 MG/ML IJ SOLN
50.0000 mg | Freq: Once | INTRAMUSCULAR | Status: AC
  Administered 2014-07-29: 50 mg via INTRAVENOUS

## 2014-07-29 MED ORDER — SODIUM CHLORIDE 0.9 % IV SOLN
Freq: Once | INTRAVENOUS | Status: AC
  Administered 2014-07-29: 11:00:00 via INTRAVENOUS

## 2014-07-29 MED ORDER — SODIUM CHLORIDE 0.9 % IJ SOLN
10.0000 mL | INTRAMUSCULAR | Status: DC | PRN
  Administered 2014-07-29: 10 mL
  Filled 2014-07-29: qty 10

## 2014-07-29 MED ORDER — FAMOTIDINE IN NACL 20-0.9 MG/50ML-% IV SOLN
INTRAVENOUS | Status: AC
  Filled 2014-07-29: qty 50

## 2014-07-29 MED ORDER — HYDROCODONE-ACETAMINOPHEN 10-325 MG PO TABS: 1.0000 | ORAL_TABLET | ORAL | Status: DC | PRN

## 2014-07-29 NOTE — Progress Notes (Signed)
Anthony Cameron Tolerated chemotherapy well today Discharged ambulatory

## 2014-07-29 NOTE — Patient Instructions (Signed)
[facility] Discharge Instructions for Patients Receiving Chemotherapy  Today you received the following chemotherapy agents carbo and taxol Follow up as scheduled Please call the clinic if you have any questions or concerns  To help prevent nausea and vomiting after your treatment, we encourage you to take your nausea medication    If you develop nausea and vomiting, or diarrhea that is not controlled by your medication, call the clinic.  The clinic phone number is [phone]. Office hours are Monday-Friday 8:30am-5:00pm.  BELOW ARE SYMPTOMS THAT SHOULD BE REPORTED IMMEDIATELY:  *FEVER GREATER THAN 101.0 F  *CHILLS WITH OR WITHOUT FEVER  NAUSEA AND VOMITING THAT IS NOT CONTROLLED WITH YOUR NAUSEA MEDICATION  *UNUSUAL SHORTNESS OF BREATH  *UNUSUAL BRUISING OR BLEEDING  TENDERNESS IN MOUTH AND THROAT WITH OR WITHOUT PRESENCE OF ULCERS  *URINARY PROBLEMS  *BOWEL PROBLEMS  UNUSUAL RASH Items with * indicate a potential emergency and should be followed up as soon as possible. If you have an emergency after office hours please contact your primary care physician or go to the nearest emergency department.  Please call the clinic during office hours if you have any questions or concerns.   You may also contact the Patient Navigator at [phone] should you have any questions or need assistance in obtaining follow up care. _____________________________________________________________________ Have you asked about our STAR program?    STAR stands for Survivorship Training and Rehabilitation, and this is a nationally recognized cancer care program that focuses on survivorship and rehabilitation.  Cancer and cancer treatments may cause problems, such as, pain, making you feel tired and keeping you from doing the things that you need or want to do. Cancer rehabilitation can help. Our goal is to reduce these troubling effects and help you have the best quality of life  possible.  You may receive a survey from a nurse that asks questions about your current state of health.  Based on the survey results, all eligible patients will be referred to the [facility] program for an evaluation so we can better serve you! A frequently asked questions sheet is available upon request.
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
## 2014-07-29 NOTE — Patient Instructions (Signed)
[place] at [facility] Discharge Instructions  RECOMMENDATIONS MADE BY THE CONSULTANT AND ANY TEST RESULTS WILL BE SENT TO YOUR REFERRING PHYSICIAN.  Exam and discussion by Dr. [name]. Call with uncontrolled nausea, vomiting, pain or other concerns. Will change your pain regimen: MS Contin 15 mg every 12 hours.  Take it routinely every 12 hours - this is a long acting pain medication Hydrocodone 10-325 mg every 4 hours as needed for break through pain. Keep appointment with the thoracic surgeon.  Follow-up in 1 week with office visit and chemotherapy.   Thank you for choosing [place] at [facility] to provide your oncology and hematology care.  To afford each patient quality time with our provider, please arrive at least 15 minutes before your scheduled appointment time.    You need to re-schedule your appointment should you arrive 10 or more minutes late.  We strive to give you quality time with our providers, and arriving late affects you and other patients whose appointments are after yours.  Also, if you no show three or more times for appointments you may be dismissed from the clinic at the providers discretion.     Again, thank you for choosing [facility].  Our hope is that these requests will decrease the amount of time that you wait before being seen by our physicians.       _____________________________________________________________  Should you have questions after your visit to [facility], please contact our office at [phone] between the hours of 8:30 a.m. and 4:30 p.m.  Voicemails left after 4:30 p.m. will not be returned until the following business day.  For prescription refill requests, have your pharmacy contact our office.

## 2014-08-02 ENCOUNTER — Inpatient Hospital Stay (HOSPITAL_COMMUNITY): Payer: Self-pay

## 2014-08-02 ENCOUNTER — Ambulatory Visit (HOSPITAL_COMMUNITY): Payer: Self-pay | Admitting: Hematology & Oncology

## 2014-08-05 ENCOUNTER — Encounter (HOSPITAL_COMMUNITY): Payer: Self-pay | Admitting: Hematology & Oncology

## 2014-08-05 ENCOUNTER — Encounter (HOSPITAL_BASED_OUTPATIENT_CLINIC_OR_DEPARTMENT_OTHER): Payer: Medicaid Other

## 2014-08-05 ENCOUNTER — Inpatient Hospital Stay (HOSPITAL_COMMUNITY): Payer: Self-pay

## 2014-08-05 ENCOUNTER — Ambulatory Visit (HOSPITAL_COMMUNITY): Payer: Self-pay | Admitting: Hematology & Oncology

## 2014-08-05 ENCOUNTER — Encounter (HOSPITAL_BASED_OUTPATIENT_CLINIC_OR_DEPARTMENT_OTHER): Payer: Medicaid Other | Admitting: Hematology & Oncology

## 2014-08-05 VITALS — BP 103/57 | HR 63 | Temp 98.5°F | Resp 16 | Wt 136.4 lb

## 2014-08-05 VITALS — BP 102/49 | HR 65 | Temp 97.9°F | Resp 20

## 2014-08-05 DIAGNOSIS — R634 Abnormal weight loss: Secondary | ICD-10-CM | POA: Diagnosis not present

## 2014-08-05 DIAGNOSIS — Z5111 Encounter for antineoplastic chemotherapy: Secondary | ICD-10-CM

## 2014-08-05 DIAGNOSIS — C159 Malignant neoplasm of esophagus, unspecified: Secondary | ICD-10-CM

## 2014-08-05 DIAGNOSIS — Z452 Encounter for adjustment and management of vascular access device: Secondary | ICD-10-CM | POA: Diagnosis present

## 2014-08-05 DIAGNOSIS — F4321 Adjustment disorder with depressed mood: Secondary | ICD-10-CM

## 2014-08-05 DIAGNOSIS — F329 Major depressive disorder, single episode, unspecified: Secondary | ICD-10-CM

## 2014-08-05 LAB — CBC WITH DIFFERENTIAL/PLATELET
BASOS ABS: 0 10*3/uL (ref 0.0–0.1)
BASOS PCT: 1 % (ref 0–1)
EOS PCT: 6 % — AB (ref 0–5)
Eosinophils Absolute: 0.1 10*3/uL (ref 0.0–0.7)
HEMATOCRIT: 31 % — AB (ref 39.0–52.0)
Hemoglobin: 9.9 g/dL — ABNORMAL LOW (ref 13.0–17.0)
LYMPHS PCT: 9 % — AB (ref 12–46)
Lymphs Abs: 0.2 10*3/uL — ABNORMAL LOW (ref 0.7–4.0)
MCH: 26.7 pg (ref 26.0–34.0)
MCHC: 31.9 g/dL (ref 30.0–36.0)
MCV: 83.6 fL (ref 78.0–100.0)
Monocytes Absolute: 0.3 10*3/uL (ref 0.1–1.0)
Monocytes Relative: 16 % — ABNORMAL HIGH (ref 3–12)
Neutro Abs: 1.1 10*3/uL — ABNORMAL LOW (ref 1.7–7.7)
Neutrophils Relative %: 68 % (ref 43–77)
Platelets: 150 10*3/uL (ref 150–400)
RBC: 3.71 MIL/uL — ABNORMAL LOW (ref 4.22–5.81)
RDW: 16.8 % — AB (ref 11.5–15.5)
WBC: 1.7 10*3/uL — ABNORMAL LOW (ref 4.0–10.5)

## 2014-08-05 LAB — COMPREHENSIVE METABOLIC PANEL
ALT: 47 U/L (ref 17–63)
AST: 51 U/L — AB (ref 15–41)
Albumin: 3.4 g/dL — ABNORMAL LOW (ref 3.5–5.0)
Alkaline Phosphatase: 62 U/L (ref 38–126)
Anion gap: 6 (ref 5–15)
BUN: 14 mg/dL (ref 6–20)
CALCIUM: 8.8 mg/dL — AB (ref 8.9–10.3)
CO2: 26 mmol/L (ref 22–32)
CREATININE: 0.54 mg/dL — AB (ref 0.61–1.24)
Chloride: 101 mmol/L (ref 101–111)
GFR calc Af Amer: >60 mL/min (ref >60)
GFR calc non Af Amer: >60 mL/min (ref >60)
Glucose, Bld: 86 mg/dL (ref 65–99)
Potassium: 3.9 mmol/L (ref 3.5–5.1)
SODIUM: 133 mmol/L — AB (ref 135–145)
TOTAL PROTEIN: 7 g/dL (ref 6.5–8.1)
Total Bilirubin: 0.6 mg/dL (ref 0.3–1.2)

## 2014-08-05 MED ORDER — SODIUM CHLORIDE 0.9 % IV SOLN
Freq: Once | INTRAVENOUS | Status: AC
  Administered 2014-08-05: 12:00:00 via INTRAVENOUS
  Filled 2014-08-05: qty 8

## 2014-08-05 MED ORDER — ALTEPLASE 2 MG IJ SOLR
INTRAMUSCULAR | Status: AC
  Filled 2014-08-05: qty 2

## 2014-08-05 MED ORDER — HEPARIN SOD (PORK) LOCK FLUSH 100 UNIT/ML IV SOLN
INTRAVENOUS | Status: AC
  Filled 2014-08-05: qty 5

## 2014-08-05 MED ORDER — SODIUM CHLORIDE 0.9 % IJ SOLN
10.0000 mL | INTRAMUSCULAR | Status: DC | PRN
  Administered 2014-08-05: 10 mL
  Filled 2014-08-05: qty 10

## 2014-08-05 MED ORDER — HEPARIN SOD (PORK) LOCK FLUSH 100 UNIT/ML IV SOLN
500.0000 [IU] | Freq: Once | INTRAVENOUS | Status: AC | PRN
  Administered 2014-08-05: 500 [IU]

## 2014-08-05 MED ORDER — FAMOTIDINE IN NACL 20-0.9 MG/50ML-% IV SOLN
20.0000 mg | Freq: Once | INTRAVENOUS | Status: AC
  Administered 2014-08-05: 20 mg via INTRAVENOUS
  Filled 2014-08-05: qty 50

## 2014-08-05 MED ORDER — PACLITAXEL CHEMO INJECTION 300 MG/50ML
50.0000 mg/m2 | Freq: Once | INTRAVENOUS | Status: AC
  Administered 2014-08-05: 90 mg via INTRAVENOUS
  Filled 2014-08-05: qty 15

## 2014-08-05 MED ORDER — STERILE WATER FOR INJECTION IJ SOLN
INTRAMUSCULAR | Status: AC
  Filled 2014-08-05: qty 10

## 2014-08-05 MED ORDER — DIPHENHYDRAMINE HCL 50 MG/ML IJ SOLN
50.0000 mg | Freq: Once | INTRAMUSCULAR | Status: AC
  Administered 2014-08-05: 50 mg via INTRAVENOUS
  Filled 2014-08-05: qty 1

## 2014-08-05 MED ORDER — SODIUM CHLORIDE 0.9 % IV SOLN
Freq: Once | INTRAVENOUS | Status: AC
  Administered 2014-08-05: 09:00:00 via INTRAVENOUS

## 2014-08-05 MED ORDER — CARBOPLATIN CHEMO INJECTION 450 MG/45ML
223.4000 mg | Freq: Once | INTRAVENOUS | Status: AC
  Administered 2014-08-05: 220 mg via INTRAVENOUS
  Filled 2014-08-05: qty 22

## 2014-08-05 MED ORDER — ALTEPLASE 2 MG IJ SOLR
2.0000 mg | Freq: Once | INTRAMUSCULAR | Status: AC | PRN
  Administered 2014-08-05: 2 mg

## 2014-08-05 MED ORDER — CITALOPRAM HYDROBROMIDE 20 MG PO TABS: ORAL_TABLET | ORAL | Status: DC
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
## 2014-08-05 NOTE — Patient Instructions (Signed)
[place] at [facility] Discharge Instructions  RECOMMENDATIONS MADE BY THE CONSULTANT AND ANY TEST RESULTS WILL BE SENT TO YOUR REFERRING PHYSICIAN.  You saw Dr. [name] today. Will see at your next appointment. Call for any concerns or questions.  Thank you for choosing [place] at [facility] to provide your oncology and hematology care.  To afford each patient quality time with our provider, please arrive at least 15 minutes before your scheduled appointment time.    You need to re-schedule your appointment should you arrive 10 or more minutes late.  We strive to give you quality time with our providers, and arriving late affects you and other patients whose appointments are after yours.  Also, if you no show three or more times for appointments you may be dismissed from the clinic at the providers discretion.     Again, thank you for choosing [facility].  Our hope is that these requests will decrease the amount of time that you wait before being seen by our physicians.       _____________________________________________________________  Should you have questions after your visit to [facility], please contact our office at [phone] between the hours of 8:30 a.m. and 4:30 p.m.  Voicemails left after 4:30 p.m. will not be returned until the following business day.  For prescription refill requests, have your pharmacy contact our office.

## 2014-08-05 NOTE — Progress Notes (Signed)
Tolerated chemo well. 

## 2014-08-06 ENCOUNTER — Encounter (HOSPITAL_BASED_OUTPATIENT_CLINIC_OR_DEPARTMENT_OTHER): Payer: Medicaid Other

## 2014-08-06 ENCOUNTER — Encounter (HOSPITAL_COMMUNITY): Payer: Self-pay

## 2014-08-06 VITALS — BP 110/60 | HR 72 | Temp 98.6°F | Resp 18 | Wt 137.5 lb

## 2014-08-06 DIAGNOSIS — Z5189 Encounter for other specified aftercare: Secondary | ICD-10-CM | POA: Diagnosis not present

## 2014-08-06 DIAGNOSIS — C159 Malignant neoplasm of esophagus, unspecified: Secondary | ICD-10-CM

## 2014-08-06 MED ORDER — FILGRASTIM 480 MCG/1.6ML IJ SOLN
480.0000 ug | Freq: Once | INTRAMUSCULAR | Status: AC
  Administered 2014-08-06: 480 ug via SUBCUTANEOUS

## 2014-08-06 MED ORDER — FILGRASTIM 480 MCG/0.8ML IJ SOSY
480.0000 ug | PREFILLED_SYRINGE | Freq: Once | INTRAMUSCULAR | Status: AC
  Filled 2014-08-06: qty 0.8

## 2014-08-06 NOTE — Progress Notes (Signed)
..  Sho Salguero presents today for injection per the provider's orders.  neupogen administration without incident; see MAR for injection details.  Patient tolerated procedure well and without incident.  No questions or complaints noted at this time.

## 2014-08-07 ENCOUNTER — Encounter (HOSPITAL_BASED_OUTPATIENT_CLINIC_OR_DEPARTMENT_OTHER): Payer: Medicaid Other

## 2014-08-07 ENCOUNTER — Encounter (HOSPITAL_COMMUNITY): Payer: Self-pay | Admitting: Hematology & Oncology

## 2014-08-07 VITALS — BP 99/50 | HR 75 | Temp 98.1°F | Resp 16

## 2014-08-07 DIAGNOSIS — C159 Malignant neoplasm of esophagus, unspecified: Secondary | ICD-10-CM

## 2014-08-07 DIAGNOSIS — Z452 Encounter for adjustment and management of vascular access device: Secondary | ICD-10-CM

## 2014-08-07 MED ORDER — FILGRASTIM 480 MCG/1.6ML IJ SOLN
480.0000 ug | Freq: Once | INTRAMUSCULAR | Status: AC
  Administered 2014-08-07: 480 ug via SUBCUTANEOUS
  Filled 2014-08-07: qty 1.6

## 2014-08-07 NOTE — Patient Instructions (Signed)
[place] at [facility] Discharge Instructions  RECOMMENDATIONS MADE BY THE CONSULTANT AND ANY TEST RESULTS WILL BE SENT TO YOUR REFERRING PHYSICIAN.  Neupogen injection today as ordered. Return tomorrow as scheduled for neupogen injection.  Thank you for choosing [place] at [facility] to provide your oncology and hematology care.  To afford each patient quality time with our provider, please arrive at least 15 minutes before your scheduled appointment time.    You need to re-schedule your appointment should you arrive 10 or more minutes late.  We strive to give you quality time with our providers, and arriving late affects you and other patients whose appointments are after yours.  Also, if you no show three or more times for appointments you may be dismissed from the clinic at the providers discretion.     Again, thank you for choosing [facility].  Our hope is that these requests will decrease the amount of time that you wait before being seen by our physicians.       _____________________________________________________________  Should you have questions after your visit to [facility], please contact our office at [phone] between the hours of 8:30 a.m. and 4:30 p.m.  Voicemails left after 4:30 p.m. will not be returned until the following business day.  For prescription refill requests, have your pharmacy contact our office.

## 2014-08-07 NOTE — Progress Notes (Signed)
Anthony Cameron presents today for injection per MD orders. Neupogen 480 mcg administered SQ in left Abdomen. Administration without incident. Patient tolerated well.

## 2014-08-08 ENCOUNTER — Encounter (HOSPITAL_BASED_OUTPATIENT_CLINIC_OR_DEPARTMENT_OTHER): Payer: Medicaid Other

## 2014-08-08 ENCOUNTER — Ambulatory Visit (INDEPENDENT_AMBULATORY_CARE_PROVIDER_SITE_OTHER): Payer: Medicaid Other | Admitting: Cardiothoracic Surgery

## 2014-08-08 ENCOUNTER — Encounter: Payer: Self-pay | Admitting: Cardiothoracic Surgery

## 2014-08-08 VITALS — BP 94/57 | HR 83 | Resp 20 | Ht 71.5 in | Wt 137.0 lb

## 2014-08-08 VITALS — BP 97/52 | HR 81 | Temp 98.4°F | Resp 16

## 2014-08-08 DIAGNOSIS — C159 Malignant neoplasm of esophagus, unspecified: Secondary | ICD-10-CM | POA: Diagnosis not present

## 2014-08-08 DIAGNOSIS — C160 Malignant neoplasm of cardia: Secondary | ICD-10-CM

## 2014-08-08 DIAGNOSIS — Z5189 Encounter for other specified aftercare: Secondary | ICD-10-CM | POA: Diagnosis not present

## 2014-08-08 MED ORDER — FILGRASTIM 480 MCG/1.6ML IJ SOLN
480.0000 ug | Freq: Once | INTRAMUSCULAR | Status: AC
  Administered 2014-08-08: 480 ug via SUBCUTANEOUS
  Filled 2014-08-08: qty 1.6

## 2014-08-08 NOTE — Progress Notes (Signed)
Anthony Cameron presents today for injection per MD orders. Neupogen 429mg administered SQ in left Abdomen. Administration without incident. Patient tolerated well.
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
## 2014-08-09 ENCOUNTER — Encounter (HOSPITAL_BASED_OUTPATIENT_CLINIC_OR_DEPARTMENT_OTHER): Payer: Medicaid Other

## 2014-08-09 VITALS — BP 97/48 | HR 67 | Temp 98.2°F | Resp 18

## 2014-08-09 DIAGNOSIS — C159 Malignant neoplasm of esophagus, unspecified: Secondary | ICD-10-CM | POA: Diagnosis not present

## 2014-08-09 DIAGNOSIS — Z5189 Encounter for other specified aftercare: Secondary | ICD-10-CM | POA: Diagnosis not present

## 2014-08-09 MED ORDER — FILGRASTIM 480 MCG/0.8ML IJ SOSY
480.0000 ug | PREFILLED_SYRINGE | Freq: Once | INTRAMUSCULAR | Status: DC
  Filled 2014-08-09: qty 0.8

## 2014-08-09 MED ORDER — FILGRASTIM 480 MCG/1.6ML IJ SOLN
480.0000 ug | Freq: Once | INTRAMUSCULAR | Status: AC
  Administered 2014-08-09: 480 ug via SUBCUTANEOUS
  Filled 2014-08-09: qty 1.6

## 2014-08-09 NOTE — Progress Notes (Signed)
Anthony Cameron presents today for injection per MD orders. Neupogen 463mg administered SQ in left Abdomen. Administration without incident. Patient tolerated well.

## 2014-08-12 ENCOUNTER — Encounter (HOSPITAL_COMMUNITY): Payer: Medicaid Other | Attending: Oncology | Admitting: Oncology

## 2014-08-12 ENCOUNTER — Encounter (HOSPITAL_COMMUNITY): Payer: Self-pay

## 2014-08-12 ENCOUNTER — Ambulatory Visit (HOSPITAL_COMMUNITY): Payer: Medicaid Other | Admitting: Oncology

## 2014-08-12 ENCOUNTER — Ambulatory Visit (HOSPITAL_COMMUNITY): Payer: Medicaid Other | Admitting: Hematology & Oncology

## 2014-08-12 ENCOUNTER — Encounter (HOSPITAL_BASED_OUTPATIENT_CLINIC_OR_DEPARTMENT_OTHER): Payer: Medicaid Other

## 2014-08-12 VITALS — BP 90/53 | HR 64 | Temp 98.3°F | Resp 18 | Wt 128.8 lb

## 2014-08-12 DIAGNOSIS — Z5111 Encounter for antineoplastic chemotherapy: Secondary | ICD-10-CM | POA: Diagnosis not present

## 2014-08-12 DIAGNOSIS — C159 Malignant neoplasm of esophagus, unspecified: Secondary | ICD-10-CM

## 2014-08-12 LAB — CBC WITH DIFFERENTIAL/PLATELET
Basophils Absolute: 0 10*3/uL (ref 0.0–0.1)
Basophils Relative: 0 % (ref 0–1)
EOS PCT: 1 % (ref 0–5)
Eosinophils Absolute: 0.1 10*3/uL (ref 0.0–0.7)
HEMATOCRIT: 33.3 % — AB (ref 39.0–52.0)
HEMOGLOBIN: 10.6 g/dL — AB (ref 13.0–17.0)
LYMPHS ABS: 0.4 10*3/uL — AB (ref 0.7–4.0)
Lymphocytes Relative: 10 % — ABNORMAL LOW (ref 12–46)
MCH: 27.1 pg (ref 26.0–34.0)
MCHC: 31.8 g/dL (ref 30.0–36.0)
MCV: 85.2 fL (ref 78.0–100.0)
MONO ABS: 0.3 10*3/uL (ref 0.1–1.0)
Monocytes Relative: 7 % (ref 3–12)
NEUTROS ABS: 3.7 10*3/uL (ref 1.7–7.7)
Neutrophils Relative %: 82 % — ABNORMAL HIGH (ref 43–77)
Platelets: 104 10*3/uL — ABNORMAL LOW (ref 150–400)
RBC: 3.91 MIL/uL — AB (ref 4.22–5.81)
RDW: 18.5 % — ABNORMAL HIGH (ref 11.5–15.5)
WBC: 4.5 10*3/uL (ref 4.0–10.5)

## 2014-08-12 LAB — COMPREHENSIVE METABOLIC PANEL
ALT: 24 U/L (ref 17–63)
ANION GAP: 6 (ref 5–15)
AST: 25 U/L (ref 15–41)
Albumin: 3.7 g/dL (ref 3.5–5.0)
Alkaline Phosphatase: 87 U/L (ref 38–126)
BILIRUBIN TOTAL: 0.5 mg/dL (ref 0.3–1.2)
BUN: 15 mg/dL (ref 6–20)
CALCIUM: 9 mg/dL (ref 8.9–10.3)
CO2: 28 mmol/L (ref 22–32)
CREATININE: 0.7 mg/dL (ref 0.61–1.24)
Chloride: 101 mmol/L (ref 101–111)
GFR calc Af Amer: >60 mL/min (ref >60)
GFR calc non Af Amer: >60 mL/min (ref >60)
GLUCOSE: 173 mg/dL — AB (ref 65–99)
Potassium: 3.7 mmol/L (ref 3.5–5.1)
SODIUM: 135 mmol/L (ref 135–145)
Total Protein: 7.2 g/dL (ref 6.5–8.1)

## 2014-08-12 MED ORDER — HEPARIN SOD (PORK) LOCK FLUSH 100 UNIT/ML IV SOLN
INTRAVENOUS | Status: AC
  Filled 2014-08-12: qty 5

## 2014-08-12 MED ORDER — CARBOPLATIN CHEMO INJECTION 450 MG/45ML
223.4000 mg | Freq: Once | INTRAVENOUS | Status: AC
  Administered 2014-08-12: 220 mg via INTRAVENOUS
  Filled 2014-08-12: qty 22

## 2014-08-12 MED ORDER — SODIUM CHLORIDE 0.9 % IV SOLN
Freq: Once | INTRAVENOUS | Status: AC
  Administered 2014-08-12: 12:00:00 via INTRAVENOUS

## 2014-08-12 MED ORDER — DIPHENHYDRAMINE HCL 50 MG/ML IJ SOLN
50.0000 mg | Freq: Once | INTRAMUSCULAR | Status: AC
  Administered 2014-08-12: 50 mg via INTRAVENOUS

## 2014-08-12 MED ORDER — SODIUM CHLORIDE 0.9 % IJ SOLN
10.0000 mL | INTRAMUSCULAR | Status: DC | PRN
  Administered 2014-08-12: 10 mL
  Filled 2014-08-12: qty 10

## 2014-08-12 MED ORDER — FAMOTIDINE IN NACL 20-0.9 MG/50ML-% IV SOLN
INTRAVENOUS | Status: AC
  Filled 2014-08-12: qty 50

## 2014-08-12 MED ORDER — DIPHENHYDRAMINE HCL 50 MG/ML IJ SOLN
INTRAMUSCULAR | Status: AC
  Filled 2014-08-12: qty 1

## 2014-08-12 MED ORDER — HEPARIN SOD (PORK) LOCK FLUSH 100 UNIT/ML IV SOLN
500.0000 [IU] | Freq: Once | INTRAVENOUS | Status: AC | PRN
  Administered 2014-08-12: 500 [IU]

## 2014-08-12 MED ORDER — FAMOTIDINE IN NACL 20-0.9 MG/50ML-% IV SOLN
20.0000 mg | Freq: Once | INTRAVENOUS | Status: AC
  Administered 2014-08-12: 20 mg via INTRAVENOUS

## 2014-08-12 MED ORDER — DEXAMETHASONE SODIUM PHOSPHATE 100 MG/10ML IJ SOLN
Freq: Once | INTRAMUSCULAR | Status: AC
  Administered 2014-08-12: 13:00:00 via INTRAVENOUS
  Filled 2014-08-12: qty 8

## 2014-08-12 MED ORDER — DEXTROSE 5 % IV SOLN
50.0000 mg/m2 | Freq: Once | INTRAVENOUS | Status: AC
  Administered 2014-08-12: 90 mg via INTRAVENOUS
  Filled 2014-08-12: qty 15
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
## 2014-08-12 NOTE — Patient Instructions (Signed)
[facility] Discharge Instructions for Patients Receiving Chemotherapy  Today you received the following chemotherapy agents taxol and Norma Fredrickson Today is your LAST treatment!  Your blood counts were good so you dont have to get neupogen. Return as scheduled Please call the clinic if you have any questions or concerns  To help prevent nausea and vomiting after your treatment, we encourage you to take your nausea medication    If you develop nausea and vomiting, or diarrhea that is not controlled by your medication, call the clinic.  The clinic phone number is [phone]. Office hours are Monday-Friday 8:30am-5:00pm.  BELOW ARE SYMPTOMS THAT SHOULD BE REPORTED IMMEDIATELY:  *FEVER GREATER THAN 101.0 F  *CHILLS WITH OR WITHOUT FEVER  NAUSEA AND VOMITING THAT IS NOT CONTROLLED WITH YOUR NAUSEA MEDICATION  *UNUSUAL SHORTNESS OF BREATH  *UNUSUAL BRUISING OR BLEEDING  TENDERNESS IN MOUTH AND THROAT WITH OR WITHOUT PRESENCE OF ULCERS  *URINARY PROBLEMS  *BOWEL PROBLEMS  UNUSUAL RASH Items with * indicate a potential emergency and should be followed up as soon as possible. If you have an emergency after office hours please contact your primary care physician or go to the nearest emergency department.  Please call the clinic during office hours if you have any questions or concerns.   You may also contact the Patient Navigator at [phone] should you have any questions or need assistance in obtaining follow up care. _____________________________________________________________________ Have you asked about our STAR program?    STAR stands for Survivorship Training and Rehabilitation, and this is a nationally recognized cancer care program that focuses on survivorship and rehabilitation.  Cancer and cancer treatments may cause problems, such as, pain, making you feel tired and keeping you from doing the things that you need or want to do. Cancer rehabilitation can  help. Our goal is to reduce these troubling effects and help you have the best quality of life possible.  You may receive a survey from a nurse that asks questions about your current state of health.  Based on the survey results, all eligible patients will be referred to the [facility] program for an evaluation so we can better serve you! A frequently asked questions sheet is available upon request.

## 2014-08-12 NOTE — Assessment & Plan Note (Addendum)
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  Seen by Dr. Servando Snare last week.  Finishing systemic therapy today.  He will need restaging scans in the near future to evaluate response to therapy and candidacy for surgical resection.   Nutritionist is following patient along.  Weight is down about 7-8 lbs.  He reports his appetite is stable.  Will need to monitor closely.  Return in 3- 4 weeks for follow-up, which will be after scans and follow-up with Dr. Servando Snare.

## 2014-08-12 NOTE — Progress Notes (Signed)
Anthony Cameron Tolerated chemotherapy well Discharged ambulatory

## 2014-08-13 ENCOUNTER — Ambulatory Visit (HOSPITAL_COMMUNITY): Payer: Medicaid Other

## 2014-08-14 ENCOUNTER — Ambulatory Visit (HOSPITAL_COMMUNITY): Payer: Medicaid Other

## 2014-08-15 ENCOUNTER — Ambulatory Visit (HOSPITAL_COMMUNITY): Payer: Medicaid Other

## 2014-08-16 ENCOUNTER — Encounter: Payer: Self-pay | Admitting: Dietician

## 2014-08-16 ENCOUNTER — Ambulatory Visit (HOSPITAL_COMMUNITY): Payer: Medicaid Other

## 2014-08-16 NOTE — Progress Notes (Addendum)
Following up with pt d/t further loss of weight in June.   Contacted Pt by Phone  Wt Readings from Last 10 Encounters:  08/12/14 128 lb 12.8 oz (58.423 kg)  08/08/14 137 lb (62.143 kg)  08/06/14 137 lb 8 oz (62.37 kg)  08/05/14 136 lb 6.4 oz (61.871 kg)  07/29/14 134 lb 6.4 oz (60.963 kg)  07/22/14 136 lb 11.2 oz (62.007 kg)  07/12/14 134 lb 6.4 oz (60.963 kg)  07/05/14 136 lb 4.8 oz (61.825 kg)  07/01/14 131 lb (59.421 kg)  06/28/14 138 lb 3.2 oz (62.687 kg)   Patient weight had decreased by 8 lbs over the month of June.  Pt is now done with his chemo, but restaging needs to be done to assess further plan of care.  Patient reports oral intake as Improving. He acknowledged he wasn't eating last month, but "I am eating now".  Patient is still suffering with some side effects. He has nausea and vomiting, but says that can be controlled with his prescriptions.  Has occasional diarrhea. Denies constipation at this time.  His primary issue is that he is suffering from painful gums.   Patient says his mouth pain is his biggest obstacle. He said he wears dentures and his mouth has been so sore recently he hasnt been able to use his dentures.   As of now, he still has the gum pain, but has started to fight through the pain and force himself to eat.   He says he tries to eat 3 meals. Trying to eat as much as I can. At the time I called him he was eating creamed potatoes and cabbage. Went over with him that each of his meals should contain some protein. I discussed some options with him that he should be able to tolerate such as peanut butter, whole milk, eggs, egg/chicken salad, cream soups etc. I have given him a handout on soft/moist high protein foods in the past  I let him know he is still eligible for a case of Ensure. He had forgotten and he says "I need that now".  He was willing to come and get the Case as soon as able. Due to the Holiday, I told him to pick it up at the cancer center on  Wednesday 7/6.   NOTE: Per PA note on 5/19 I see a G TUBE was in discussion. Up to this point his weight had been fairly stable and he was doing well. If weight continues to fall and he is to continue with therapy I think this again may need to be discussed.   Burtis Junes RD, LDN Nutrition Pager: [phone] 08/16/2014 4:24 PM

## 2014-08-23 ENCOUNTER — Ambulatory Visit (HOSPITAL_COMMUNITY): Payer: Medicaid Other | Admitting: Oncology

## 2014-08-26 ENCOUNTER — Telehealth (HOSPITAL_COMMUNITY): Payer: Self-pay | Admitting: Oncology

## 2014-08-26 ENCOUNTER — Other Ambulatory Visit (HOSPITAL_COMMUNITY): Payer: Self-pay | Admitting: Oncology

## 2014-08-26 DIAGNOSIS — C159 Malignant neoplasm of esophagus, unspecified: Secondary | ICD-10-CM

## 2014-08-26 NOTE — Telephone Encounter (Signed)
Insurance company denied CT abd/pelvis.  After performing Peer-to-peer, I was able to get CT abd approved.  Unable to meet criteria for CT Pelvis.  Order changed in [facility] and linked to appointment.  Kieryn Burtis, PA-C 3:12 PM 08/26/2014

## 2014-08-27 ENCOUNTER — Ambulatory Visit (HOSPITAL_COMMUNITY)
Admission: RE | Admit: 2014-08-27 | Discharge: 2014-08-27 | Disposition: A | Discharge status: Home / Self Care | Payer: Medicaid Other | Source: Ambulatory Visit | Attending: Oncology | Admitting: Oncology

## 2014-08-27 ENCOUNTER — Encounter (HOSPITAL_COMMUNITY): Payer: Self-pay

## 2014-08-27 DIAGNOSIS — I2510 Atherosclerotic heart disease of native coronary artery without angina pectoris: Secondary | ICD-10-CM | POA: Insufficient documentation

## 2014-08-27 DIAGNOSIS — Z08 Encounter for follow-up examination after completed treatment for malignant neoplasm: Secondary | ICD-10-CM | POA: Insufficient documentation

## 2014-08-27 DIAGNOSIS — C159 Malignant neoplasm of esophagus, unspecified: Secondary | ICD-10-CM | POA: Diagnosis not present

## 2014-08-27 DIAGNOSIS — R918 Other nonspecific abnormal finding of lung field: Secondary | ICD-10-CM | POA: Diagnosis not present

## 2014-08-27 DIAGNOSIS — Z9221 Personal history of antineoplastic chemotherapy: Secondary | ICD-10-CM | POA: Diagnosis not present

## 2014-08-27 DIAGNOSIS — R911 Solitary pulmonary nodule: Secondary | ICD-10-CM | POA: Diagnosis not present

## 2014-08-27 DIAGNOSIS — J439 Emphysema, unspecified: Secondary | ICD-10-CM | POA: Diagnosis not present

## 2014-08-27 MED ORDER — IOHEXOL 300 MG/ML  SOLN
100.0000 mL | Freq: Once | INTRAMUSCULAR | Status: AC | PRN
  Administered 2014-08-27: 100 mL via INTRAVENOUS

## 2014-08-29 ENCOUNTER — Encounter: Payer: Self-pay | Admitting: Cardiothoracic Surgery

## 2014-08-29 ENCOUNTER — Ambulatory Visit (INDEPENDENT_AMBULATORY_CARE_PROVIDER_SITE_OTHER): Payer: Medicaid Other | Admitting: Cardiothoracic Surgery

## 2014-08-29 VITALS — BP 101/51 | HR 71 | Resp 16 | Ht 71.5 in | Wt 142.5 lb

## 2014-08-29 DIAGNOSIS — C160 Malignant neoplasm of cardia: Secondary | ICD-10-CM

## 2014-08-29 NOTE — Progress Notes (Signed)
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   ALT 24 08/12/2014   AST 25 08/12/2014   NA 135 08/12/2014   K 3.7 08/12/2014   CL 101 08/12/2014   CREATININE 0.70 08/12/2014   BUN 15 08/12/2014   CO2 28 08/12/2014   INR 1.18 06/27/2014  ENDOSCOPY: 06/03/2014 ESOPHAGUS: A circumferential mass was found in the  distal esophagus. Multiple biopsies were performed using cold forceps. UES 15 CM FROM THE TEETH. MASS STARTS 38 CM FROM THE TEETH AND EXTENDS TOTHE GE JXN 46 CM FROMTHE TEETH. STOMACH: Mild erosive gastritis (inflammation) was found in the gastric antrum. Multiple biopsies were performed using cold forceps. DUODENUM: The duodenal mucosa showed no abnormalities in the bulb and 2nd part of the duodenum. COMPLICATIONS: There were no immediate complications. ENDOSCOPIC IMPRESSION: 1. 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 MILD Erosive gastritis EUS: 06/13/2014 Upper EUS w/FUA Tumor positioned in the muscularis propria layer of esophageal wall (sT3) No paraesophageal adenopathy gastrohepatic ligament lymph node 3.5 cm, biopsy positive for Sq. Cell ca Stage IIIA  EUS findings: 1. The mass above correlates with a hypoechoic lesion that clearly passes into and through the muscularis propria layer of  the esophageal wall (uT3). 2. There is no paraesophageal adenopathy. 3. There was a large, suspicious appearing, gastroehpatic ligamant lymphnode (3.5cm maximum dimension) that lays very close to the distal edge of the mass.  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 The risks and options of surgery were discussed with the patient in detail . I've explained to the patient that proceeding with surgical resection would decrease the chance of local recurrence after his current therapy . At this point he is declining proceeding with a surgical intervention. He notes that he is feeling better and eating better, I will plan to see him back in one month.  He does have evidence of aortic valve disease and coronary calcification will need cardiology evaluation before having surgery.   Grace Isaac MD      [address] [place] [address] Office [phone]   Beeper [phone]  08/29/2014 10:12 AM

## 2014-09-04 ENCOUNTER — Encounter (HOSPITAL_COMMUNITY): Payer: Medicaid Other | Attending: Hematology & Oncology | Admitting: Hematology & Oncology

## 2014-09-04 ENCOUNTER — Encounter (HOSPITAL_COMMUNITY): Payer: Self-pay | Admitting: Hematology & Oncology

## 2014-09-04 ENCOUNTER — Other Ambulatory Visit (HOSPITAL_COMMUNITY): Payer: Self-pay | Admitting: Oncology

## 2014-09-04 VITALS — BP 100/54 | HR 74 | Temp 98.7°F | Resp 18 | Wt 140.7 lb

## 2014-09-04 DIAGNOSIS — C159 Malignant neoplasm of esophagus, unspecified: Secondary | ICD-10-CM

## 2014-09-04 DIAGNOSIS — R918 Other nonspecific abnormal finding of lung field: Secondary | ICD-10-CM | POA: Diagnosis not present

## 2014-09-04 MED ORDER — MORPHINE SULFATE ER 15 MG PO TBCR: 15.0000 mg | EXTENDED_RELEASE_TABLET | Freq: Two times a day (BID) | ORAL | Status: DC

## 2014-09-04 MED ORDER — HYDROCODONE-ACETAMINOPHEN 10-325 MG PO TABS: 1.0000 | ORAL_TABLET | ORAL | Status: DC | PRN

## 2014-09-04 NOTE — Patient Instructions (Addendum)
Anthony Cameron at [facility] Discharge Instructions  RECOMMENDATIONS MADE BY THE CONSULTANT AND ANY TEST RESULTS WILL BE SENT TO YOUR REFERRING PHYSICIAN.  Exam completed by Dr [name] today Return to see the doctor in 1 month.   Port flushes every 8 weeks. Refills on your pain medicine. Please call the clinic if you have any questions or concerns.   Thank you for choosing [place] at [facility] to provide your oncology and hematology care.  To afford each patient quality time with our provider, please arrive at least 15 minutes before your scheduled appointment time.    You need to re-schedule your appointment should you arrive 10 or more minutes late.  We strive to give you quality time with our providers, and arriving late affects you and other patients whose appointments are after yours.  Also, if you no show three or more times for appointments you may be dismissed from the clinic at the providers discretion.     Again, thank you for choosing [facility].  Our hope is that these requests will decrease the amount of time that you wait before being seen by our physicians.       _____________________________________________________________  Should you have questions after your visit to [facility], please contact our office at [phone] between the hours of 8:30 a.m. and 4:30 p.m.  Voicemails left after 4:30 p.m. will not be returned until the following business day.  For prescription refill requests, have your pharmacy contact our office.
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
## 2014-09-27 ENCOUNTER — Encounter (HOSPITAL_COMMUNITY): Payer: Self-pay | Admitting: Hematology & Oncology

## 2014-10-03 ENCOUNTER — Ambulatory Visit (INDEPENDENT_AMBULATORY_CARE_PROVIDER_SITE_OTHER): Payer: Medicaid Other | Admitting: Cardiothoracic Surgery

## 2014-10-03 ENCOUNTER — Other Ambulatory Visit: Payer: Self-pay | Admitting: *Deleted

## 2014-10-03 ENCOUNTER — Encounter: Payer: Self-pay | Admitting: Cardiothoracic Surgery

## 2014-10-03 VITALS — BP 98/54 | HR 78 | Resp 16 | Ht 72.0 in | Wt 143.7 lb

## 2014-10-03 DIAGNOSIS — C159 Malignant neoplasm of esophagus, unspecified: Secondary | ICD-10-CM

## 2014-10-03 DIAGNOSIS — C160 Malignant neoplasm of cardia: Secondary | ICD-10-CM | POA: Diagnosis not present

## 2014-10-03 NOTE — Progress Notes (Signed)
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 08/12/2014   INR 1.18 06/27/2014  ENDOSCOPY: 06/03/2014 ESOPHAGUS: A circumferential mass was found in the distal esophagus. Multiple biopsies were performed using cold forceps. UES 15 CM FROM THE TEETH. MASS STARTS 38 CM FROM THE TEETH AND EXTENDS TOTHE GE JXN 46 CM FROMTHE TEETH. STOMACH: Mild erosive  gastritis (inflammation) was found in the gastric antrum. Multiple biopsies were performed using cold forceps. DUODENUM: The duodenal mucosa showed no abnormalities in the bulb and 2nd part of the duodenum. COMPLICATIONS: There were no immediate complications. ENDOSCOPIC IMPRESSION: 1. 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 MILD Erosive gastritis EUS: 06/13/2014 Upper EUS w/FUA Tumor positioned in the muscularis propria layer of esophageal wall (sT3) No paraesophageal adenopathy gastrohepatic ligament lymph node 3.5 cm, biopsy positive for Sq. Cell ca Stage IIIA  EUS findings: 1. The mass above correlates with a hypoechoic lesion that clearly passes into and through the muscularis propria layer of the esophageal wall (uT3). 2. There is no paraesophageal adenopathy. 3. There was a large, suspicious appearing,  gastroehpatic ligamant lymphnode (3.5cm maximum dimension) that lays very close to the distal edge of the mass.  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 The risks and options of surgery were discussed with the patient in detail . I've explained to the patient that proceeding with surgical resection would decrease the chance of local recurrence after his current therapy .   He does have evidence of aortic valve calcification  and coronary calcification  So will have cardiology evaluation preop. Will get restaging PET to check pulmonary nodules  Prior to surgery    Grace Isaac MD      [address] [place] [address] Office [phone]   Beeper [phone]  10/03/2014 5:23 PM

## 2014-10-07 ENCOUNTER — Encounter (HOSPITAL_COMMUNITY): Payer: Medicaid Other | Attending: Hematology & Oncology

## 2014-10-07 ENCOUNTER — Encounter (HOSPITAL_COMMUNITY): Payer: Self-pay | Admitting: Hematology & Oncology

## 2014-10-07 ENCOUNTER — Encounter (HOSPITAL_BASED_OUTPATIENT_CLINIC_OR_DEPARTMENT_OTHER): Payer: Medicaid Other | Admitting: Hematology & Oncology

## 2014-10-07 VITALS — BP 94/57 | HR 64 | Temp 98.8°F | Resp 18 | Wt 144.0 lb

## 2014-10-07 DIAGNOSIS — C159 Malignant neoplasm of esophagus, unspecified: Secondary | ICD-10-CM | POA: Diagnosis present

## 2014-10-07 DIAGNOSIS — M2550 Pain in unspecified joint: Secondary | ICD-10-CM

## 2014-10-07 DIAGNOSIS — Z87891 Personal history of nicotine dependence: Secondary | ICD-10-CM | POA: Diagnosis not present

## 2014-10-07 DIAGNOSIS — D649 Anemia, unspecified: Secondary | ICD-10-CM | POA: Diagnosis not present

## 2014-10-07 DIAGNOSIS — Z87898 Personal history of other specified conditions: Secondary | ICD-10-CM

## 2014-10-07 DIAGNOSIS — R918 Other nonspecific abnormal finding of lung field: Secondary | ICD-10-CM | POA: Diagnosis not present

## 2014-10-07 LAB — CBC WITH DIFFERENTIAL/PLATELET
BASOS ABS: 0 10*3/uL (ref 0.0–0.1)
Basophils Relative: 0 % (ref 0–1)
Eosinophils Absolute: 0.2 10*3/uL (ref 0.0–0.7)
Eosinophils Relative: 5 % (ref 0–5)
HEMATOCRIT: 30.8 % — AB (ref 39.0–52.0)
Hemoglobin: 10.1 g/dL — ABNORMAL LOW (ref 13.0–17.0)
LYMPHS ABS: 0.5 10*3/uL — AB (ref 0.7–4.0)
LYMPHS PCT: 16 % (ref 12–46)
MCH: 30.3 pg (ref 26.0–34.0)
MCHC: 32.8 g/dL (ref 30.0–36.0)
MCV: 92.5 fL (ref 78.0–100.0)
MONO ABS: 0.6 10*3/uL (ref 0.1–1.0)
Monocytes Relative: 20 % — ABNORMAL HIGH (ref 3–12)
NEUTROS ABS: 1.8 10*3/uL (ref 1.7–7.7)
Neutrophils Relative %: 59 % (ref 43–77)
Platelets: 171 10*3/uL (ref 150–400)
RBC: 3.33 MIL/uL — ABNORMAL LOW (ref 4.22–5.81)
RDW: 17.2 % — ABNORMAL HIGH (ref 11.5–15.5)
WBC: 3.1 10*3/uL — ABNORMAL LOW (ref 4.0–10.5)

## 2014-10-07 LAB — COMPREHENSIVE METABOLIC PANEL
ALT: 14 U/L — ABNORMAL LOW (ref 17–63)
AST: 21 U/L (ref 15–41)
Albumin: 3.6 g/dL (ref 3.5–5.0)
Alkaline Phosphatase: 111 U/L (ref 38–126)
Anion gap: 8 (ref 5–15)
BILIRUBIN TOTAL: 0.6 mg/dL (ref 0.3–1.2)
BUN: 12 mg/dL (ref 6–20)
CO2: 29 mmol/L (ref 22–32)
CREATININE: 0.68 mg/dL (ref 0.61–1.24)
Calcium: 9.1 mg/dL (ref 8.9–10.3)
Chloride: 102 mmol/L (ref 101–111)
GFR calc Af Amer: >60 mL/min (ref >60)
Glucose, Bld: 106 mg/dL — ABNORMAL HIGH (ref 65–99)
POTASSIUM: 4.1 mmol/L (ref 3.5–5.1)
Sodium: 139 mmol/L (ref 135–145)
TOTAL PROTEIN: 7.2 g/dL (ref 6.5–8.1)

## 2014-10-07 MED ORDER — SODIUM CHLORIDE 0.9 % IJ SOLN
10.0000 mL | INTRAMUSCULAR | Status: DC | PRN
  Administered 2014-10-07: 10 mL via INTRAVENOUS
  Filled 2014-10-07: qty 10

## 2014-10-07 MED ORDER — HYDROCODONE-ACETAMINOPHEN 10-325 MG PO TABS: 1.0000 | ORAL_TABLET | ORAL | Status: DC | PRN

## 2014-10-07 MED ORDER — HEPARIN SOD (PORK) LOCK FLUSH 100 UNIT/ML IV SOLN
INTRAVENOUS | Status: AC
  Filled 2014-10-07: qty 5

## 2014-10-07 MED ORDER — HEPARIN SOD (PORK) LOCK FLUSH 100 UNIT/ML IV SOLN
500.0000 [IU] | Freq: Once | INTRAVENOUS | Status: AC
  Administered 2014-10-07: 500 [IU] via INTRAVENOUS

## 2014-10-07 NOTE — Patient Instructions (Signed)
..  [facility] at [facility] Discharge Instructions  RECOMMENDATIONS MADE BY THE CONSULTANT AND ANY TEST RESULTS WILL BE SENT TO YOUR REFERRING PHYSICIAN.  Exam per Dr. [name]  Return in 3 weeks/ reschedule if needed due to scheduling of surgery  Port flush today  Thank you for choosing [place] at [facility] to provide your oncology and hematology care.  To afford each patient quality time with our provider, please arrive at least 15 minutes before your scheduled appointment time.    You need to re-schedule your appointment should you arrive 10 or more minutes late.  We strive to give you quality time with our providers, and arriving late affects you and other patients whose appointments are after yours.  Also, if you no show three or more times for appointments you may be dismissed from the clinic at the providers discretion.     Again, thank you for choosing [facility].  Our hope is that these requests will decrease the amount of time that you wait before being seen by our physicians.       _____________________________________________________________  Should you have questions after your visit to [facility], please contact our office at [phone] between the hours of 8:30 a.m. and 4:30 p.m.  Voicemails left after 4:30 p.m. will not be returned until the following business day.  For prescription refill requests, have your pharmacy contact our office.
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
## 2014-10-07 NOTE — Progress Notes (Signed)
..  Maura Crandall presented for Portacath access and flush.  Proper placement of portacath confirmed by CXR.  Portacath located rt chest wall accessed with  H 20 needle.  Good blood return present. Portacath flushed with 25m NS and 500U/513mHeparin and needle removed intact.  Procedure tolerated well and without incident.

## 2014-10-07 NOTE — Progress Notes (Signed)
See MD exam encounter for notes

## 2014-10-10 ENCOUNTER — Encounter: Payer: Self-pay | Admitting: Cardiovascular Disease

## 2014-10-10 ENCOUNTER — Encounter: Payer: Self-pay | Admitting: *Deleted

## 2014-10-10 ENCOUNTER — Ambulatory Visit (INDEPENDENT_AMBULATORY_CARE_PROVIDER_SITE_OTHER): Payer: Medicaid Other | Admitting: Cardiovascular Disease

## 2014-10-10 VITALS — BP 122/64 | HR 84 | Ht 72.0 in | Wt 141.0 lb

## 2014-10-10 DIAGNOSIS — R9431 Abnormal electrocardiogram [ECG] [EKG]: Secondary | ICD-10-CM | POA: Diagnosis not present

## 2014-10-10 DIAGNOSIS — I2510 Atherosclerotic heart disease of native coronary artery without angina pectoris: Secondary | ICD-10-CM | POA: Diagnosis not present

## 2014-10-10 DIAGNOSIS — Z01818 Encounter for other preprocedural examination: Secondary | ICD-10-CM

## 2014-10-10 DIAGNOSIS — Z87891 Personal history of nicotine dependence: Secondary | ICD-10-CM

## 2014-10-10 DIAGNOSIS — I2583 Coronary atherosclerosis due to lipid rich plaque: Secondary | ICD-10-CM

## 2014-10-10 MED ORDER — ASPIRIN EC 81 MG PO TBEC: 81.0000 mg | DELAYED_RELEASE_TABLET | Freq: Every day | ORAL | Status: AC

## 2014-10-10 MED ORDER — SIMVASTATIN 20 MG PO TABS: 20.0000 mg | ORAL_TABLET | Freq: Every day | ORAL | Status: DC

## 2014-10-10 NOTE — Patient Instructions (Signed)
Your physician has requested that you have a lexiscan myoview. For further information please visit [email or web address]. Please follow instruction sheet, as given. Office will contact with results via phone or letter.   Begin Aspirin '81mg'$  daily - may buy over the counter. Begin Simvastatin '20mg'$  daily - new sent to [facility] today. Continue all other medications.   Follow up to be determined based on test results.
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
## 2014-10-10 NOTE — Addendum Note (Signed)
Addended by: Merlene Laughter on: 10/10/2014 03:52 PM   Modules accepted: Orders

## 2014-10-10 NOTE — Addendum Note (Signed)
Addended by: Laurine Blazer on: 10/10/2014 02:10 PM   Modules accepted: Orders, Medications

## 2014-10-11 ENCOUNTER — Encounter (HOSPITAL_COMMUNITY): Admission: RE | Admit: 2014-10-11 | Payer: Medicaid Other | Source: Ambulatory Visit

## 2014-10-16 ENCOUNTER — Encounter (HOSPITAL_COMMUNITY): Payer: Medicaid Other

## 2014-10-16 ENCOUNTER — Inpatient Hospital Stay (HOSPITAL_COMMUNITY): Admission: RE | Admit: 2014-10-16 | Payer: Medicaid Other | Source: Ambulatory Visit

## 2014-10-23 ENCOUNTER — Encounter (HOSPITAL_COMMUNITY)
Admission: RE | Admit: 2014-10-23 | Discharge: 2014-10-23 | Disposition: A | Discharge status: Home / Self Care | Payer: Medicaid Other | Source: Ambulatory Visit | Attending: Cardiovascular Disease | Admitting: Cardiovascular Disease

## 2014-10-23 ENCOUNTER — Telehealth: Payer: Self-pay | Admitting: Radiology

## 2014-10-23 ENCOUNTER — Inpatient Hospital Stay (HOSPITAL_COMMUNITY): Admission: RE | Admit: 2014-10-23 | Payer: Medicaid Other | Source: Ambulatory Visit

## 2014-10-23 ENCOUNTER — Encounter (HOSPITAL_COMMUNITY): Payer: Self-pay

## 2014-10-23 DIAGNOSIS — R9439 Abnormal result of other cardiovascular function study: Secondary | ICD-10-CM | POA: Insufficient documentation

## 2014-10-23 DIAGNOSIS — Z01818 Encounter for other preprocedural examination: Secondary | ICD-10-CM | POA: Diagnosis present

## 2014-10-23 DIAGNOSIS — I2583 Coronary atherosclerosis due to lipid rich plaque: Secondary | ICD-10-CM

## 2014-10-23 DIAGNOSIS — I2510 Atherosclerotic heart disease of native coronary artery without angina pectoris: Secondary | ICD-10-CM | POA: Diagnosis not present

## 2014-10-23 LAB — NM MYOCAR MULTI W/SPECT W/WALL MOTION / EF
CHL CUP NUCLEAR SDS: 3
CHL CUP NUCLEAR SSS: 7
CHL CUP RESTING HR STRESS: 80 {beats}/min
CSEPPHR: 115 {beats}/min
LHR: 0.28
LV dias vol: 115 mL
LVSYSVOL: 47 mL
SRS: 4
TID: 1.12

## 2014-10-23 MED ORDER — SODIUM CHLORIDE 0.9 % IJ SOLN
INTRAMUSCULAR | Status: AC
  Administered 2014-10-23: 10 mL via INTRAVENOUS
  Filled 2014-10-23: qty 3

## 2014-10-23 MED ORDER — TECHNETIUM TC 99M SESTAMIBI GENERIC - CARDIOLITE
30.0000 | Freq: Once | INTRAVENOUS | Status: AC | PRN
  Administered 2014-10-23: 29.6 via INTRAVENOUS

## 2014-10-23 MED ORDER — TECHNETIUM TC 99M SESTAMIBI - CARDIOLITE
10.0000 | Freq: Once | INTRAVENOUS | Status: AC | PRN
  Administered 2014-10-23: 9.4 via INTRAVENOUS

## 2014-10-23 MED ORDER — REGADENOSON 0.4 MG/5ML IV SOLN
INTRAVENOUS | Status: AC
  Administered 2014-10-23: 0.4 mg via INTRAVENOUS
  Filled 2014-10-23: qty 5

## 2014-10-24 ENCOUNTER — Telehealth: Payer: Self-pay

## 2014-10-24 ENCOUNTER — Encounter (HOSPITAL_COMMUNITY): Payer: Medicaid Other

## 2014-10-24 ENCOUNTER — Ambulatory Visit: Payer: Medicaid Other | Admitting: Cardiothoracic Surgery

## 2014-10-24 MED ORDER — METOPROLOL TARTRATE 25 MG PO TABS: 12.5000 mg | ORAL_TABLET | Freq: Two times a day (BID) | ORAL | Status: DC

## 2014-10-24 NOTE — Telephone Encounter (Signed)
Patient informed of test results, surgery not yet scheduled,escribed metoprolol to pharmacy.Pt verbalized understanding to start BB 1 week before surgery

## 2014-10-24 NOTE — Telephone Encounter (Signed)
-----   Message from Herminio Commons, MD sent at 10/23/2014  5:14 PM EDT ----- Large amount of myocardial infarct with normal LVEF. Would continue ASA and statin and start (if feasible) metoprolol 12.5 mg bid at least 7 days prior to surgery to attenuate perioperative cardiac risk. Please arrange for follow up with me in 3 months.

## 2014-10-28 ENCOUNTER — Encounter (HOSPITAL_BASED_OUTPATIENT_CLINIC_OR_DEPARTMENT_OTHER): Payer: Medicaid Other

## 2014-10-28 ENCOUNTER — Encounter: Payer: Self-pay | Admitting: Gastroenterology

## 2014-10-28 ENCOUNTER — Encounter (HOSPITAL_COMMUNITY): Payer: Medicaid Other | Attending: Hematology & Oncology | Admitting: Oncology

## 2014-10-28 ENCOUNTER — Encounter (HOSPITAL_COMMUNITY): Payer: Self-pay | Admitting: Oncology

## 2014-10-28 VITALS — BP 111/79 | HR 58 | Temp 97.7°F | Resp 18 | Wt 145.9 lb

## 2014-10-28 DIAGNOSIS — C159 Malignant neoplasm of esophagus, unspecified: Secondary | ICD-10-CM

## 2014-10-28 LAB — CBC WITH DIFFERENTIAL/PLATELET
BASOS ABS: 0 10*3/uL (ref 0.0–0.1)
Basophils Relative: 0 % (ref 0–1)
EOS PCT: 11 % — AB (ref 0–5)
Eosinophils Absolute: 0.4 10*3/uL (ref 0.0–0.7)
HEMATOCRIT: 31.3 % — AB (ref 39.0–52.0)
HEMOGLOBIN: 10.4 g/dL — AB (ref 13.0–17.0)
LYMPHS ABS: 0.6 10*3/uL — AB (ref 0.7–4.0)
LYMPHS PCT: 15 % (ref 12–46)
MCH: 30 pg (ref 26.0–34.0)
MCHC: 33.2 g/dL (ref 30.0–36.0)
MCV: 90.2 fL (ref 78.0–100.0)
Monocytes Absolute: 0.6 10*3/uL (ref 0.1–1.0)
Monocytes Relative: 17 % — ABNORMAL HIGH (ref 3–12)
NEUTROS PCT: 57 % (ref 43–77)
Neutro Abs: 2.1 10*3/uL (ref 1.7–7.7)
Platelets: 166 10*3/uL (ref 150–400)
RBC: 3.47 MIL/uL — AB (ref 4.22–5.81)
RDW: 14 % (ref 11.5–15.5)
WBC: 3.7 10*3/uL — AB (ref 4.0–10.5)

## 2014-10-28 LAB — COMPREHENSIVE METABOLIC PANEL
ALK PHOS: 114 U/L (ref 38–126)
ALT: 16 U/L — AB (ref 17–63)
AST: 23 U/L (ref 15–41)
Albumin: 3.1 g/dL — ABNORMAL LOW (ref 3.5–5.0)
Anion gap: 8 (ref 5–15)
BILIRUBIN TOTAL: 0.4 mg/dL (ref 0.3–1.2)
BUN: 22 mg/dL — AB (ref 6–20)
CALCIUM: 8.6 mg/dL — AB (ref 8.9–10.3)
CO2: 26 mmol/L (ref 22–32)
CREATININE: 0.76 mg/dL (ref 0.61–1.24)
Chloride: 106 mmol/L (ref 101–111)
Glucose, Bld: 90 mg/dL (ref 65–99)
Potassium: 3.7 mmol/L (ref 3.5–5.1)
Sodium: 140 mmol/L (ref 135–145)
Total Protein: 7.1 g/dL (ref 6.5–8.1)

## 2014-10-28 MED ORDER — HYDROCODONE-ACETAMINOPHEN 10-325 MG PO TABS: 1.0000 | ORAL_TABLET | ORAL | Status: DC | PRN

## 2014-10-28 NOTE — Progress Notes (Signed)
LABS DRAWN

## 2014-10-28 NOTE — Assessment & Plan Note (Addendum)
Stage IIIA squamous cell carcinoma of the esophagus, S/P concomitant chemoradiation (Carboplatin/Paclitaxel)  Finishing in June 2016.  Oncology history updated  PET scan is now scheduled for 9/16.  I have messaged Dr. Servando Snare regarding the patient's rescheduled PET scan in the event that he would like to see the patient at a later date to help with surgical planning after PET imaging.    Labs today: CBC diff, CMET  Refill Hydrocodone.  He notes recurrent arthritis that has been ongoing for years.  I will defer work-up and management of this to his primary care provider, as we will begin to decrease and eventually cease from prescribing pain medications (particularly if negative PET scan). Paraneoplastic syndrome is unlikely.  Return in 4 weeks for follow-up, sooner if PET scan demonstrates metastatic disease requiring systemic treatment.

## 2014-10-28 NOTE — Patient Instructions (Signed)
[place] at [facility] Discharge Instructions  RECOMMENDATIONS MADE BY THE CONSULTANT AND ANY TEST RESULTS WILL BE SENT TO YOUR REFERRING PHYSICIAN.  Exam completed by [name] PA. PET scan scheduled for 11/01/14. Tom contacted Dr.Gerhardt's office to see if they need to re-schedule your appointment for after PET scan. Return to this clinic for follow up in 3-4 weeks.  You were given a refill on Hydrocodone. Report any issues/concerns as needed prior to appointments. Return as scheduled.   Thank you for choosing [place] at [facility] to provide your oncology and hematology care.  To afford each patient quality time with our provider, please arrive at least 15 minutes before your scheduled appointment time.    You need to re-schedule your appointment should you arrive 10 or more minutes late.  We strive to give you quality time with our providers, and arriving late affects you and other patients whose appointments are after yours.  Also, if you no show three or more times for appointments you may be dismissed from the clinic at the providers discretion.     Again, thank you for choosing [facility].  Our hope is that these requests will decrease the amount of time that you wait before being seen by our physicians.       _____________________________________________________________  Should you have questions after your visit to [facility], please contact our office at [phone] between the hours of 8:30 a.m. and 4:30 p.m.  Voicemails left after 4:30 p.m. will not be returned until the following business day.  For prescription refill requests, have your pharmacy contact our office.
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
## 2014-10-31 ENCOUNTER — Ambulatory Visit: Payer: Medicaid Other | Admitting: Cardiothoracic Surgery

## 2014-11-01 ENCOUNTER — Encounter (HOSPITAL_COMMUNITY)
Admission: RE | Admit: 2014-11-01 | Discharge: 2014-11-01 | Disposition: A | Discharge status: Home / Self Care | Payer: Medicaid Other | Source: Ambulatory Visit | Attending: Cardiothoracic Surgery | Admitting: Cardiothoracic Surgery

## 2014-11-01 DIAGNOSIS — C159 Malignant neoplasm of esophagus, unspecified: Secondary | ICD-10-CM | POA: Insufficient documentation

## 2014-11-01 LAB — GLUCOSE, CAPILLARY: Glucose-Capillary: 84 mg/dL (ref 65–99)

## 2014-11-01 MED ORDER — FLUDEOXYGLUCOSE F - 18 (FDG) INJECTION
8.2000 | Freq: Once | INTRAVENOUS | Status: DC | PRN
  Administered 2014-11-01: 8.2 via INTRAVENOUS
  Filled 2014-11-01: qty 8.2

## 2014-11-05 ENCOUNTER — Ambulatory Visit (INDEPENDENT_AMBULATORY_CARE_PROVIDER_SITE_OTHER): Payer: Medicaid Other | Admitting: Cardiothoracic Surgery

## 2014-11-05 ENCOUNTER — Encounter: Payer: Self-pay | Admitting: Cardiothoracic Surgery

## 2014-11-05 VITALS — BP 109/65 | HR 70 | Resp 20 | Ht 72.0 in | Wt 149.0 lb

## 2014-11-05 DIAGNOSIS — C160 Malignant neoplasm of cardia: Secondary | ICD-10-CM

## 2014-11-06 NOTE — Progress Notes (Signed)
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 corresponding to the previously diagnosed primary esophageal neoplasm. In addition, there is metastatic gastrohepatic ligament lymphadenopathy, as above. 2. In addition, there are multiple nonenlarged but mildly hypermetabolic lymph nodes in the axillary regions bilaterally and in  the left inguinal region, which are nonspecific. Metastatic disease is not favored. 3. Previously described pulmonary nodules are similar in size, number and distribution compared to the prior study, and demonstrate no hypermetabolism on today's PET examination. These may simply be of infectious or inflammatory etiology, however, attention on future followup studies is recommended to ensure the stability or resolution of these lesions, as metastatic lesions or primary bronchogenic lesions such as adenocarcinoma are not excluded. 4. Emphysema. 5. Atherosclerosis, including left anterior descending coronary artery disease. Please note that although the presence of coronary artery calcium documents the presence of coronary artery disease, the severity of this disease and any potential stenosis cannot be assessed on this non-gated CT examination. Assessment for potential  risk factor modification, dietary therapy or pharmacologic therapy may be warranted, if clinically indicated. 6. There are calcifications of the aortic valve. Echocardiographic correlation for evaluation of potential valvular dysfunction may be warranted if clinically indicated.   Electronically Signed   By: Vinnie Langton M.D.   On: 06/11/2014 11:11     Recent Lab Findings: Lab Results  Component Value Date   WBC 3.7* 10/28/2014   HGB 10.4* 10/28/2014   HCT 31.3* 10/28/2014   PLT 166 10/28/2014   GLUCOSE 90 10/28/2014   ALT 16* 10/28/2014   AST 23 10/28/2014   NA 140 10/28/2014   K 3.7 10/28/2014   CL 106 10/28/2014   CREATININE 0.76 10/28/2014   BUN 22* 10/28/2014   CO2 26 10/28/2014   INR 1.18 06/27/2014  ENDOSCOPY: 06/03/2014 ESOPHAGUS: A circumferential mass was found in the distal esophagus. Multiple biopsies were performed using cold forceps. UES 15 CM FROM THE TEETH. MASS STARTS 38 CM FROM THE TEETH AND EXTENDS TOTHE GE JXN 46 CM FROMTHE TEETH. STOMACH: Mild erosive gastritis (inflammation) was found in the gastric antrum. Multiple biopsies were performed using cold forceps. DUODENUM: The duodenal mucosa showed no abnormalities in the bulb and 2nd part of the duodenum. COMPLICATIONS: There were no immediate complications. ENDOSCOPIC IMPRESSION: 1. 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 MILD Erosive gastritis EUS: 06/13/2014 Upper EUS w/FUA Tumor positioned in the muscularis propria layer of esophageal wall (sT3) No paraesophageal adenopathy gastrohepatic ligament lymph node 3.5 cm, biopsy positive for Sq. Cell ca Stage IIIA  EUS findings: 1. The mass above correlates with a hypoechoic lesion that clearly passes into and through the muscularis propria layer of the esophageal wall (uT3). 2. There is no paraesophageal adenopathy. 3. There was a large, suspicious appearing, gastroehpatic ligamant lymphnode (3.5cm maximum dimension) that lays very close to  the distal edge of the mass.  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 So far he has had excellent palliation with the treatment with improved by mouth intake and weight gain. Will have radiation oncology review the radiation fields and determine if the hypermetabolic lesion in the right neck was included in the radiation field, if not consideration for further treatment locally to this area could be a possibility.  Grace Isaac MD      [address] [place] [address] Office [phone]   Beeper [phone]  11/06/2014 2:13 PM

## 2014-11-18 ENCOUNTER — Encounter (HOSPITAL_COMMUNITY): Payer: Self-pay | Admitting: Hematology & Oncology

## 2014-11-18 ENCOUNTER — Encounter (HOSPITAL_COMMUNITY): Payer: Medicaid Other | Attending: Hematology & Oncology | Admitting: Hematology & Oncology

## 2014-11-18 ENCOUNTER — Encounter (HOSPITAL_COMMUNITY): Payer: Medicaid Other | Attending: Hematology & Oncology

## 2014-11-18 VITALS — BP 94/54 | HR 66 | Temp 98.7°F | Resp 14 | Wt 141.9 lb

## 2014-11-18 DIAGNOSIS — C155 Malignant neoplasm of lower third of esophagus: Secondary | ICD-10-CM

## 2014-11-18 DIAGNOSIS — M2550 Pain in unspecified joint: Secondary | ICD-10-CM

## 2014-11-18 DIAGNOSIS — Z87891 Personal history of nicotine dependence: Secondary | ICD-10-CM

## 2014-11-18 DIAGNOSIS — D649 Anemia, unspecified: Secondary | ICD-10-CM

## 2014-11-18 DIAGNOSIS — Z23 Encounter for immunization: Secondary | ICD-10-CM

## 2014-11-18 DIAGNOSIS — K228 Other specified diseases of esophagus: Secondary | ICD-10-CM | POA: Diagnosis not present

## 2014-11-18 DIAGNOSIS — C159 Malignant neoplasm of esophagus, unspecified: Secondary | ICD-10-CM | POA: Insufficient documentation

## 2014-11-18 DIAGNOSIS — Z0000 Encounter for general adult medical examination without abnormal findings: Secondary | ICD-10-CM

## 2014-11-18 MED ORDER — MORPHINE SULFATE ER 15 MG PO TBCR: 15.0000 mg | EXTENDED_RELEASE_TABLET | Freq: Two times a day (BID) | ORAL | Status: DC

## 2014-11-18 MED ORDER — CAPECITABINE 500 MG PO TABS: ORAL_TABLET | ORAL | Status: DC

## 2014-11-18 MED ORDER — CAPECITABINE 150 MG PO TABS: ORAL_TABLET | ORAL | Status: DC

## 2014-11-18 MED ORDER — HYDROCODONE-ACETAMINOPHEN 10-325 MG PO TABS: 1.0000 | ORAL_TABLET | ORAL | Status: DC | PRN

## 2014-11-18 MED ORDER — INFLUENZA VAC SPLIT QUAD 0.5 ML IM SUSY
0.5000 mL | PREFILLED_SYRINGE | INTRAMUSCULAR | Status: AC
  Administered 2014-11-18: 0.5 mL via INTRAMUSCULAR
  Filled 2014-11-18: qty 0.5

## 2014-11-18 NOTE — Patient Instructions (Addendum)
[facility]   CHEMOTHERAPY INSTRUCTIONS   Xeloda - diarrhea, hand-foot syndrome (hands/feet can get red/tender/and skin can peel). Avoid friction and hot environments on hands/feet. Wear cotton socks. Lotion twice a day to hands/fingers/feet/toes with Udder cream. Mucositis (inflammation of any mucosal membrane can develop -this can occur in the throat/mouth). Mouth sores, nausea/vomiting, anemia, fatigue can also develop. Take Imodium if diarrhea develops and contact us immediately - we will give you further instructions on how to take your Imodium. Xeloda usually comes with a teaching packet from the mail order/specialty pharmacy that will supply you with the drug that is really informative about diarrhea and hand-foot syndrome. No pregnant, child bearing age people, or animals should come into contact with this drug. Even though this is in pill form - it is still very powerful!!! If you should be instructed to discontinue taking this drug, bring the drug into the [facility] and we will dispose of it in a safe manner. Chemotherapy is a biohazard and must be disposed of properly. Do not touch this pill much. It would be best if the caregiver wore gloves while handling. Do not put this pill in with the rest of your pills in a pill box. Keep them in a separate pill box.   Dosage:   Xeloda '500mg'$  tablet. Take 3 tablets in the am and 3 tablets in the pm for 7 days in a row. Followed by 7 days without any Xeloda.   Xeloda '150mg'$  tablet. Take 1 tablet in the am and 1 tablet in the pm for 7 days in a row. Followed by 7 days without any Xeloda.   Your dosage should = '1650mg'$  twice a day x 7 days in a row followed by 7 days of no Xeloda.  You should take this medication within 30 minutes after eating your am and pm meals. NO grapefruit or grapefruit juice while taking Xeloda.     POTENTIAL SIDE EFFECTS OF TREATMENT: Increased Susceptibility to Infection, Vomiting, Hair  Thinning, Changes in Character of Skin and Nails (brittleness, dryness,etc.), Bone Marrow Suppression, Abdominal Cramping, Complete Hair Loss, Nausea, Diarrhea, Sun Sensitivity and Mouth Sores   EDUCATIONAL MATERIALS GIVEN AND REVIEWED: Specific Instructions Sheets: Xeloda   SELF CARE ACTIVITIES WHILE ON CHEMOTHERAPY: Increase your fluid intake 48 hours prior to treatment and drink at least 2 quarts per day after treatment., No alcohol intake., No aspirin or other medications unless approved by your oncologist., Eat foods that are light and easy to digest., Eat foods at cold or room temperature., No fried, fatty, or spicy foods immediately before or after treatment., Have teeth cleaned professionally before starting treatment. Keep dentures and partial plates clean., Use soft toothbrush and do not use mouthwashes that contain alcohol. Biotene is a good mouthwash that is available at most pharmacies or may be ordered by calling [phone]., Use warm salt water gargles (1 teaspoon salt per 1 quart warm water) before and after meals and at bedtime. Or you may rinse with 2 tablespoons of three -percent hydrogen peroxide mixed in eight ounces of water., Always use sunscreen with SPF (Sun Protection Factor) of 30 or higher., Use your nausea medication as directed to prevent nausea., Use your stool softener or laxative as directed to prevent constipation. and Use your anti-diarrheal medication as directed to stop diarrhea.  Please wash your hands for at least 30 seconds using warm soapy water. Handwashing is the #1 way to prevent the spread of germs. Stay away  from sick people or people who are getting over a cold. If you develop respiratory systems such as green/yellow mucus production or productive cough or persistent cough let us know and we will see if you need an antibiotic. It is a good idea to keep a pair of gloves on when going into grocery stores/[employer] to decrease your risk of coming into contact  with germs on the carts, etc. Carry alcohol hand gel with you at all times and use it frequently if out in public. All foods need to be cooked thoroughly. No raw foods. No medium or undercooked meats, eggs. If your food is cooked medium well, it does not need to be hot pink or saturated with bloody liquid at all. Vegetables and fruits need to be washed/rinsed under the faucet with a dish detergent before being consumed. You can eat raw fruits and vegetables unless we tell you otherwise but it would be best if you cooked them or bought frozen. Do not eat off of salad bars or hot bars unless you really trust the cleanliness of the restaurant. If you need dental work, please let Dr. [name] know before you go for your appointment so that we can coordinate the best possible time for you in regards to your chemo regimen. You need to also let your dentist know that you are actively taking chemo. We may need to do labs prior to your dental appointment. We also want your bowels moving at least every other day. If this is not happening, we need to know so that we can get you on a bowel regimen to help you go.      MEDICATIONS: You have been given prescriptions for the following medications:  Xeloda '500mg'$  tablet. Take 3 tablets in the am and 3 tablets in the pm for 7 days in a row. Followed by 7 days without any Xeloda.   Xeloda '150mg'$  tablet. Take 1 tablet in the am and 1 tablet in the pm for 7 days in a row. Followed by 7 days without any Xeloda.   Zofran '8mg'$  tablet. Take 1 tablet every 8 hours as needed for nausea/vomiting. (#1 nausea med to take, this can constipate)  Compazine '10mg'$  tablet. Take 1 tablet every 6 hours as needed for nausea/vomiting. (#2 nausea med to take, this can make you sleepy)  EMLA cream. Apply a quarter size amount to port site 1 hour prior to chemo. Do not rub in. Cover with plastic wrap.   Over-the-Counter Meds:  Miralax 1 capful in 8 oz of fluid daily. May increase to two  times a day if needed. This is a stool softener. If this doesn't work proceed you can add:  Senokot S  - start with 1 tablet two times a day and increase to 4 tablets two times a day if needed. (total of 8 tablets in a 24 hour period). This is a stimulant laxative.   Call us if this does not help your bowels move.   Imodium '2mg'$  capsule. Take 2 capsules after the 1st loose stool and then 1 capsule every 2 hours until you go a total of 12 hours without having a loose stool. Call the [facility] if loose stools continue. May take 2 tablets @ bedtime and every 4 hours until morning. If diarrhea recurs repeat. Call [facility] and let us know that you are having diarrhea and whether or not the Imodium is working.   You also have a prescription for Lomotil. You may take 1  capsule every 4 hours as needed for diarrhea.    SYMPTOMS TO REPORT AS SOON AS POSSIBLE AFTER TREATMENT:  FEVER GREATER THAN 100.5 F  CHILLS WITH OR WITHOUT FEVER  NAUSEA AND VOMITING THAT IS NOT CONTROLLED WITH YOUR NAUSEA MEDICATION  UNUSUAL SHORTNESS OF BREATH  UNUSUAL BRUISING OR BLEEDING  TENDERNESS IN MOUTH AND THROAT WITH OR WITHOUT PRESENCE OF ULCERS  URINARY PROBLEMS  BOWEL PROBLEMS  UNUSUAL RASH    Wear comfortable clothing and clothing appropriate for easy access to any Portacath or PICC line. Let us know if there is anything that we can do to make your therapy better!      I have been informed and understand all of the instructions given to me and have received a copy. I have been instructed to call the clinic [phone] or my family physician as soon as possible for continued medical care, if indicated. I do not have any more questions at this time but understand that I may call the [facility] or the Patient Navigator at [phone] during office hours should I have questions or need assistance in obtaining follow-up care.                Capecitabine tablets What is this  medicine? CAPECITABINE (ka pe SITE a been) is a chemotherapy drug. It slows the growth of cancer cells. This medicine is used to treat breast cancer, and also colon or rectal cancer. This medicine may be used for other purposes; ask your health care provider or pharmacist if you have questions. COMMON BRAND NAME(S): Xeloda What should I tell my health care provider before I take this medicine? They need to know if you have any of these conditions: -bleeding or blood disorders -dihydropyrimidine dehydrogenase (DPD) deficiency -heart disease -infection (especially a virus infection such as chickenpox, cold sores, or herpes) -kidney disease -liver disease -an unusual or allergic reaction to capecitabine, 5-fluorouracil, other medicines, foods, dyes, or preservatives -pregnant or trying to get pregnant -breast-feeding How should I use this medicine? Take this medicine by mouth with a glass of water, within 30 minutes of the end of a meal. Do not cut, crush or chew this medicine. Follow the directions on the prescription label. Take your medicine at regular intervals. Do not take it more often than directed. Do not stop taking except on your doctor's advice. Your doctor may want you to take a combination of 150 mg and 500 mg tablets for each dose. It is very important that you know how to correctly take your dose. Taking the wrong tablets could result in an overdose (too much medication) or underdose (too little medication). Talk to your pediatrician regarding the use of this medicine in children. Special care may be needed. Overdosage: If you think you have taken too much of this medicine contact a poison control center or emergency room at once. NOTE: This medicine is only for you. Do not share this medicine with others. What if I miss a dose? If you miss a dose, do not take the missed dose at all. Do not take double or extra doses. Instead, continue with your next scheduled dose and check with  your doctor. What may interact with this medicine? -antacids with aluminum and/or magnesium -folic acid -leucovorin -medicines to increase blood counts like filgrastim, pegfilgrastim, sargramostim -phenytoin -vaccines -warfarin Talk to your doctor or health care professional before taking any of these medicines: -acetaminophen -aspirin -ibuprofen -ketoprofen -naproxen This list may not describe all possible interactions.  Give your health care provider a list of all the medicines, herbs, non-prescription drugs, or dietary supplements you use. Also tell them if you smoke, drink alcohol, or use illegal drugs. Some items may interact with your medicine. What should I watch for while using this medicine? Visit your doctor for checks on your progress. This drug may make you feel generally unwell. This is not uncommon, as chemotherapy can affect healthy cells as well as cancer cells. Report any side effects. Continue your course of treatment even though you feel ill unless your doctor tells you to stop. In some cases, you may be given additional medicines to help with side effects. Follow all directions for their use. Call your doctor or health care professional for advice if you get a fever, chills or sore throat, or other symptoms of a cold or flu. Do not treat yourself. This drug decreases your body's ability to fight infections. Try to avoid being around people who are sick. This medicine may increase your risk to bruise or bleed. Call your doctor or health care professional if you notice any unusual bleeding. Be careful brushing and flossing your teeth or using a toothpick because you may get an infection or bleed more easily. If you have any dental work done, tell your dentist you are receiving this medicine. Avoid taking products that contain aspirin, acetaminophen, ibuprofen, naproxen, or ketoprofen unless instructed by your doctor. These medicines may hide a fever. Do not become pregnant  while taking this medicine. Women should inform their doctor if they wish to become pregnant or think they might be pregnant. There is a potential for serious side effects to an unborn child. Talk to your health care professional or pharmacist for more information. Do not breast-feed an infant while taking this medicine. Men are advised not to father a child while taking this medicine. What side effects may I notice from receiving this medicine? Side effects that you should report to your doctor or health care professional as soon as possible: -allergic reactions like skin rash, itching or hives, swelling of the face, lips, or tongue -low blood counts - this medicine may decrease the number of white blood cells, red blood cells and platelets. You may be at increased risk for infections and bleeding. -signs of infection - fever or chills, cough, sore throat, pain or difficulty passing urine -signs of decreased platelets or bleeding - bruising, pinpoint red spots on the skin, black, tarry stools, blood in the urine -signs of decreased red blood cells - unusually weak or tired, fainting spells, lightheadedness -breathing problems -changes in vision -chest pain -dark urine -diarrhea of more than 4 bowel movements in one day or any diarrhea at night; bloody or watery diarrhea -dizziness -mouth sores -nausea and vomiting -pain, swelling, redness at site where injected -pain, tingling, numbness in the hands or feet -redness, swelling, or sores on hands or feet -stomach pain -vomiting -yellow color of skin or eyes Side effects that usually do not require medical attention (report to your doctor or health care professional if they continue or are bothersome): -constipation -diarrhea -dry or itchy skin -hair loss -loss of appetite -nausea -weak or tired This list may not describe all possible side effects. Call your doctor for medical advice about side effects. You may report side effects to FDA  at 1-800-FDA-1088. Where should I keep my medicine? Keep out of the reach of children. Store at room temperature between 15 and 30 degrees C (59 and 86 degrees F). Keep  container tightly closed. Throw away any unused medicine after the expiration date. NOTE: This sheet is a summary. It may not cover all possible information. If you have questions about this medicine, talk to your doctor, pharmacist, or health care provider.  2015, [employer]. (2012-12-08 12:47:46)

## 2014-11-18 NOTE — Progress Notes (Signed)
Anthony Cameron presents today for injection per MD orders. Flu vaccine administered IM in left deltoid. Administration without incident. Patient tolerated well.
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
## 2014-11-18 NOTE — Patient Instructions (Signed)
[facility] [place] at [facility] Discharge Instructions  RECOMMENDATIONS MADE BY THE CONSULTANT AND ANY TEST RESULTS WILL BE SENT TO YOUR REFERRING PHYSICIAN.  Exam and discussion by Dr. [name] Will give you your flu vaccine today. Prescriptions for MS Contin 15 mg every 12 hours and Hydrocodone - take as directed.  Follow-up next week for discussion of therapy.  Thank you for choosing [facility] [address] to provide your oncology and hematology care.  To afford each patient quality time with our provider, please arrive at least 15 minutes before your scheduled appointment time.    You need to re-schedule your appointment should you arrive 10 or more minutes late.  We strive to give you quality time with our providers, and arriving late affects you and other patients whose appointments are after yours.  Also, if you no show three or more times for appointments you may be dismissed from the clinic at the providers discretion.     Again, thank you for choosing [facility].  Our hope is that these requests will decrease the amount of time that you wait before being seen by our physicians.       _____________________________________________________________  Should you have questions after your visit to [facility], please contact our office at [phone] between the hours of 8:30 a.m. and 4:30 p.m.  Voicemails left after 4:30 p.m. will not be returned until the following business day.  For prescription refill requests, have your pharmacy contact our office.

## 2014-11-20 ENCOUNTER — Encounter (HOSPITAL_BASED_OUTPATIENT_CLINIC_OR_DEPARTMENT_OTHER): Payer: Medicaid Other

## 2014-11-20 DIAGNOSIS — C159 Malignant neoplasm of esophagus, unspecified: Secondary | ICD-10-CM

## 2014-11-20 MED ORDER — ONDANSETRON HCL 8 MG PO TABS: 8.0000 mg | ORAL_TABLET | Freq: Three times a day (TID) | ORAL | Status: DC | PRN

## 2014-11-20 NOTE — Progress Notes (Signed)
RX transferred to [facility]. Prescription clarified with pharmacist that the Xeloda '150mg'$  tablet is supposed to be for 7 days on and 7 days off along with the Xeloda '500mg'$  prescription. They can not give me a ship date yet.

## 2014-11-20 NOTE — Progress Notes (Signed)
Chemo teaching done and consent signed for Xeloda. Calendar to be given when Xeloda drug is delivered to patient.

## 2014-11-26 ENCOUNTER — Other Ambulatory Visit (HOSPITAL_COMMUNITY): Payer: Self-pay | Admitting: *Deleted

## 2014-11-26 ENCOUNTER — Encounter (HOSPITAL_COMMUNITY): Payer: Self-pay | Admitting: *Deleted

## 2014-11-26 DIAGNOSIS — C159 Malignant neoplasm of esophagus, unspecified: Secondary | ICD-10-CM

## 2014-11-26 MED ORDER — CAPECITABINE 150 MG PO TABS: ORAL_TABLET | ORAL | Status: DC

## 2014-11-26 MED ORDER — CAPECITABINE 500 MG PO TABS: ORAL_TABLET | ORAL | Status: DC

## 2014-11-27 ENCOUNTER — Encounter (HOSPITAL_COMMUNITY): Payer: Self-pay | Admitting: Hematology & Oncology

## 2014-11-27 ENCOUNTER — Encounter (HOSPITAL_BASED_OUTPATIENT_CLINIC_OR_DEPARTMENT_OTHER): Payer: Medicaid Other | Admitting: Hematology & Oncology

## 2014-11-27 ENCOUNTER — Encounter (HOSPITAL_COMMUNITY): Payer: Medicaid Other

## 2014-11-27 VITALS — BP 105/57 | HR 76 | Temp 98.2°F | Resp 18 | Wt 141.0 lb

## 2014-11-27 DIAGNOSIS — R630 Anorexia: Secondary | ICD-10-CM | POA: Diagnosis not present

## 2014-11-27 DIAGNOSIS — C159 Malignant neoplasm of esophagus, unspecified: Secondary | ICD-10-CM

## 2014-11-27 DIAGNOSIS — R911 Solitary pulmonary nodule: Secondary | ICD-10-CM | POA: Diagnosis not present

## 2014-11-27 DIAGNOSIS — D649 Anemia, unspecified: Secondary | ICD-10-CM | POA: Diagnosis not present

## 2014-11-27 DIAGNOSIS — Z87891 Personal history of nicotine dependence: Secondary | ICD-10-CM

## 2014-11-27 DIAGNOSIS — Z95828 Presence of other vascular implants and grafts: Secondary | ICD-10-CM

## 2014-11-27 DIAGNOSIS — R200 Anesthesia of skin: Secondary | ICD-10-CM

## 2014-11-27 MED ORDER — SODIUM CHLORIDE 0.9 % IJ SOLN
10.0000 mL | INTRAMUSCULAR | Status: DC | PRN
  Administered 2014-11-27: 10 mL via INTRAVENOUS
  Filled 2014-11-27: qty 10

## 2014-11-27 MED ORDER — HEPARIN SOD (PORK) LOCK FLUSH 100 UNIT/ML IV SOLN
500.0000 [IU] | Freq: Once | INTRAVENOUS | Status: AC
  Administered 2014-11-27: 500 [IU] via INTRAVENOUS
  Filled 2014-11-27: qty 5

## 2014-11-27 NOTE — Progress Notes (Signed)
..  Anthony Cameron presented for Portacath access and flush.  Portacath located rt chest wall accessed with  H 20 needle.  Good blood return present. Portacath flushed with 52m NS and 500U/530mHeparin and needle removed intact.  Procedure tolerated well and without incident.
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
## 2014-11-27 NOTE — Progress Notes (Signed)
Please see doctors encounter for more inforamtion 

## 2014-11-27 NOTE — Patient Instructions (Addendum)
..  [place] at [facility] Discharge Instructions  RECOMMENDATIONS MADE BY THE CONSULTANT AND ANY TEST RESULTS WILL BE SENT TO YOUR REFERRING PHYSICIAN.  xeloda- start tomorrow as planned If you have mouth sores or soreness  to the bottoms of your feet and palms of your hands develop over the weekend---stop xeloda and call us Monday We will plan to continue the xeloda as long as you are doing well and cancer is responding See you back next week We will rescan you in 6-8 weeks after [place]  Thank you for choosing [place] at [facility] to provide your oncology and hematology care.  To afford each patient quality time with our provider, please arrive at least 15 minutes before your scheduled appointment time.    You need to re-schedule your appointment should you arrive 10 or more minutes late.  We strive to give you quality time with our providers, and arriving late affects you and other patients whose appointments are after yours.  Also, if you no show three or more times for appointments you may be dismissed from the clinic at the providers discretion.     Again, thank you for choosing [facility].  Our hope is that these requests will decrease the amount of time that you wait before being seen by our physicians.       _____________________________________________________________  Should you have questions after your visit to [facility], please contact our office at [phone] between the hours of 8:30 a.m. and 4:30 p.m.  Voicemails left after 4:30 p.m. will not be returned until the following business day.  For prescription refill requests, have your pharmacy contact our office.

## 2014-12-02 ENCOUNTER — Other Ambulatory Visit: Payer: Self-pay | Admitting: Cardiovascular Disease

## 2014-12-02 ENCOUNTER — Encounter (HOSPITAL_COMMUNITY): Payer: Medicaid Other

## 2014-12-02 ENCOUNTER — Ambulatory Visit (HOSPITAL_COMMUNITY): Payer: Medicaid Other | Admitting: Hematology & Oncology

## 2014-12-04 ENCOUNTER — Other Ambulatory Visit (HOSPITAL_COMMUNITY): Payer: Self-pay | Admitting: Hematology & Oncology

## 2014-12-04 ENCOUNTER — Encounter (HOSPITAL_BASED_OUTPATIENT_CLINIC_OR_DEPARTMENT_OTHER): Payer: Medicaid Other | Admitting: Oncology

## 2014-12-04 ENCOUNTER — Encounter (HOSPITAL_COMMUNITY): Payer: Self-pay | Admitting: Oncology

## 2014-12-04 ENCOUNTER — Encounter (HOSPITAL_COMMUNITY): Payer: Medicaid Other

## 2014-12-04 VITALS — BP 86/50 | HR 59 | Temp 98.8°F | Resp 14 | Wt 140.0 lb

## 2014-12-04 DIAGNOSIS — C159 Malignant neoplasm of esophagus, unspecified: Secondary | ICD-10-CM

## 2014-12-04 DIAGNOSIS — Z0000 Encounter for general adult medical examination without abnormal findings: Secondary | ICD-10-CM

## 2014-12-04 DIAGNOSIS — Z23 Encounter for immunization: Secondary | ICD-10-CM

## 2014-12-04 LAB — CBC WITH DIFFERENTIAL/PLATELET
BASOS PCT: 1 %
Basophils Absolute: 0 10*3/uL (ref 0.0–0.1)
EOS ABS: 0.2 10*3/uL (ref 0.0–0.7)
Eosinophils Relative: 4 %
HCT: 32.5 % — ABNORMAL LOW (ref 39.0–52.0)
HEMOGLOBIN: 10.3 g/dL — AB (ref 13.0–17.0)
Lymphocytes Relative: 17 %
Lymphs Abs: 0.6 10*3/uL — ABNORMAL LOW (ref 0.7–4.0)
MCH: 28.1 pg (ref 26.0–34.0)
MCHC: 31.7 g/dL (ref 30.0–36.0)
MCV: 88.6 fL (ref 78.0–100.0)
MONO ABS: 0.5 10*3/uL (ref 0.1–1.0)
MONOS PCT: 13 %
NEUTROS PCT: 65 %
Neutro Abs: 2.4 10*3/uL (ref 1.7–7.7)
Platelets: 208 10*3/uL (ref 150–400)
RBC: 3.67 MIL/uL — ABNORMAL LOW (ref 4.22–5.81)
RDW: 14 % (ref 11.5–15.5)
WBC: 3.7 10*3/uL — ABNORMAL LOW (ref 4.0–10.5)

## 2014-12-04 LAB — COMPREHENSIVE METABOLIC PANEL
ALBUMIN: 3.5 g/dL (ref 3.5–5.0)
ALT: 16 U/L — ABNORMAL LOW (ref 17–63)
ANION GAP: 7 (ref 5–15)
AST: 21 U/L (ref 15–41)
Alkaline Phosphatase: 113 U/L (ref 38–126)
BUN: 17 mg/dL (ref 6–20)
CHLORIDE: 102 mmol/L (ref 101–111)
CO2: 29 mmol/L (ref 22–32)
Calcium: 9.4 mg/dL (ref 8.9–10.3)
Creatinine, Ser: 0.9 mg/dL (ref 0.61–1.24)
GFR calc Af Amer: >60 mL/min (ref >60)
GFR calc non Af Amer: >60 mL/min (ref >60)
GLUCOSE: 122 mg/dL — AB (ref 65–99)
POTASSIUM: 3.9 mmol/L (ref 3.5–5.1)
SODIUM: 138 mmol/L (ref 135–145)
Total Bilirubin: 0.3 mg/dL (ref 0.3–1.2)
Total Protein: 7.6 g/dL (ref 6.5–8.1)

## 2014-12-04 MED ORDER — PNEUMOCOCCAL VAC POLYVALENT 25 MCG/0.5ML IJ INJ
0.5000 mL | INJECTION | Freq: Once | INTRAMUSCULAR | Status: AC
  Administered 2014-12-04: 0.5 mL via INTRAMUSCULAR
  Filled 2014-12-04: qty 0.5

## 2014-12-04 NOTE — Progress Notes (Signed)
Please see doctors encounter for more information 
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
## 2014-12-04 NOTE — Patient Instructions (Signed)
[place] at [facility] Discharge Instructions  RECOMMENDATIONS MADE BY THE CONSULTANT AND ANY TEST RESULTS WILL BE SENT TO YOUR REFERRING PHYSICIAN.  Exam and discussion by Robynn Pane, PA-C Pneumonia vaccine today Labs today Report uncontrolled nausea, vomiting, diarrhea, pain or peeling of hands or feet.  Follow-up in 2 weeks with labs and office visit.  Thank you for choosing [place] at [facility] to provide your oncology and hematology care.  To afford each patient quality time with our provider, please arrive at least 15 minutes before your scheduled appointment time.    You need to re-schedule your appointment should you arrive 10 or more minutes late.  We strive to give you quality time with our providers, and arriving late affects you and other patients whose appointments are after yours.  Also, if you no show three or more times for appointments you may be dismissed from the clinic at the providers discretion.     Again, thank you for choosing [facility].  Our hope is that these requests will decrease the amount of time that you wait before being seen by our physicians.       _____________________________________________________________  Should you have questions after your visit to [facility], please contact our office at [phone] between the hours of 8:30 a.m. and 4:30 p.m.  Voicemails left after 4:30 p.m. will not be returned until the following business day.  For prescription refill requests, have your pharmacy contact our office.

## 2014-12-04 NOTE — Assessment & Plan Note (Addendum)
Stage IIIA esophageal carcinoma.   He is S/P weekly carboplatin/paclitaxel with radiation therapy finishing in June 2016.  Unfortunately, PET scan demonstrated a hypermetabolic superior mediastinal paraesophageal lesion.  As a result, he is now on concomitant Xeloda + XRT.  Oncology history is updated.  He has an appt with Dr. Servando Snare tomorrow.  He is tolerating therapy well thus far.  He takes his evening Xeloda tonight and then he is on his 7 day respite period.  Pneumovax vaccine is ordered for today.  He is S/P influenza vaccine last week.  Labs today: CBC diff, CMET  Labs in 2 weeks: CBC diff, CMET  He is encouraged to drink 80+ oz of H2O daily.  Return in 2 weeks for follow-up.

## 2014-12-04 NOTE — Progress Notes (Signed)
Anthony Cameron presents today for injection per MD orders. Pneumonia vaccine administered IM in right deltoid. Administration without incident. Patient tolerated well. Anthony Cameron presented for [facility]. Labs per MD order drawn via Peripheral Line 23 gauge needle inserted in right AC  Good blood return present. Procedure without incident.  Needle removed intact. Patient tolerated procedure well.

## 2014-12-05 ENCOUNTER — Ambulatory Visit (INDEPENDENT_AMBULATORY_CARE_PROVIDER_SITE_OTHER): Payer: Medicaid Other | Admitting: Cardiothoracic Surgery

## 2014-12-05 ENCOUNTER — Encounter: Payer: Self-pay | Admitting: Cardiothoracic Surgery

## 2014-12-05 VITALS — BP 89/53 | HR 75 | Resp 16 | Ht 72.0 in | Wt 140.0 lb

## 2014-12-05 DIAGNOSIS — C160 Malignant neoplasm of cardia: Secondary | ICD-10-CM

## 2014-12-05 MED ORDER — METOPROLOL TARTRATE 25 MG PO TABS: 12.5000 mg | ORAL_TABLET | ORAL | Status: AC

## 2014-12-05 NOTE — Progress Notes (Signed)
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  No focal hypermetabolic activity to suggest skeletal metastasis.  IMPRESSION: 1. Long segment of hypermetabolism throughout the mid to  distal esophagus, corresponding to the previously diagnosed primary esophageal neoplasm. In addition, there is metastatic gastrohepatic ligament lymphadenopathy, as above. 2. In addition, there are multiple nonenlarged but mildly hypermetabolic lymph nodes in the axillary regions bilaterally and in the left inguinal region, which are nonspecific. Metastatic disease is not favored. 3. Previously described pulmonary nodules are similar in size, number and distribution compared to the prior study, and demonstrate no hypermetabolism on today's PET examination. These may simply be of infectious or inflammatory etiology, however, attention on future followup studies is recommended to ensure the stability or resolution of these lesions, as metastatic lesions or primary bronchogenic lesions such as adenocarcinoma are not excluded. 4. Emphysema. 5. Atherosclerosis, including left anterior descending coronary artery disease. Please note that although the presence of coronary artery calcium documents the presence of coronary artery disease, the severity of this  disease and any potential stenosis cannot be assessed on this non-gated CT examination. Assessment for potential risk factor modification, dietary therapy or pharmacologic therapy may be warranted, if clinically indicated. 6. There are calcifications of the aortic valve. Echocardiographic correlation for evaluation of potential valvular dysfunction may be warranted if clinically indicated.   Electronically Signed   By: Vinnie Langton M.D.   On: 06/11/2014 11:11     Recent Lab Findings: Lab Results  Component Value Date   WBC 3.7* 12/04/2014   HGB 10.3* 12/04/2014   HCT 32.5* 12/04/2014   PLT 208 12/04/2014   GLUCOSE 122* 12/04/2014   ALT 16* 12/04/2014   AST 21 12/04/2014   NA 138 12/04/2014   K 3.9 12/04/2014   CL 102 12/04/2014   CREATININE 0.90 12/04/2014   BUN 17 12/04/2014   CO2 29 12/04/2014   INR 1.18 06/27/2014  ENDOSCOPY: 06/03/2014 ESOPHAGUS: A circumferential mass was found in the distal esophagus. Multiple biopsies were performed using cold forceps. UES 15 CM FROM THE TEETH. MASS STARTS 38 CM FROM THE TEETH AND EXTENDS TOTHE GE JXN 46 CM FROMTHE TEETH. STOMACH: Mild erosive gastritis (inflammation) was found in the gastric antrum. Multiple biopsies were performed using cold forceps. DUODENUM: The duodenal mucosa showed no abnormalities in the bulb and 2nd part of the duodenum. COMPLICATIONS: There were no immediate complications. ENDOSCOPIC IMPRESSION: 1. 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 MILD Erosive gastritis EUS: 06/13/2014 Upper EUS w/FUA Tumor positioned in the muscularis propria layer of esophageal wall (sT3) No paraesophageal adenopathy gastrohepatic ligament lymph node 3.5 cm, biopsy positive for Sq. Cell ca Stage IIIA  EUS findings: 1. The mass above correlates with a hypoechoic lesion that clearly passes into and through the muscularis propria layer of the esophageal wall (uT3). 2. There is no paraesophageal adenopathy. 3. There was a  large, suspicious appearing, gastroehpatic ligamant lymphnode (3.5cm maximum dimension) that lays very close to the distal edge of the mass.  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  Will see back as needed followed by oncology and cardiology  Grace Isaac MD      [address] [place] [address] Office [phone]   Beeper [phone]  12/05/2014 4:14 PM

## 2014-12-20 ENCOUNTER — Ambulatory Visit (HOSPITAL_COMMUNITY): Payer: Medicaid Other | Admitting: Oncology

## 2014-12-20 ENCOUNTER — Other Ambulatory Visit (HOSPITAL_COMMUNITY): Payer: Medicaid Other

## 2014-12-24 ENCOUNTER — Encounter (HOSPITAL_COMMUNITY): Payer: Medicaid Other | Attending: Hematology & Oncology | Admitting: Oncology

## 2014-12-24 ENCOUNTER — Other Ambulatory Visit (HOSPITAL_COMMUNITY): Payer: Self-pay | Admitting: Oncology

## 2014-12-24 ENCOUNTER — Encounter (HOSPITAL_COMMUNITY): Payer: Medicaid Other

## 2014-12-24 VITALS — BP 106/59 | HR 55 | Temp 98.2°F | Resp 16 | Wt 143.0 lb

## 2014-12-24 DIAGNOSIS — C155 Malignant neoplasm of lower third of esophagus: Secondary | ICD-10-CM | POA: Diagnosis not present

## 2014-12-24 DIAGNOSIS — C159 Malignant neoplasm of esophagus, unspecified: Secondary | ICD-10-CM | POA: Insufficient documentation

## 2014-12-24 DIAGNOSIS — Z95828 Presence of other vascular implants and grafts: Secondary | ICD-10-CM

## 2014-12-24 LAB — CBC WITH DIFFERENTIAL/PLATELET
BASOS ABS: 0 10*3/uL (ref 0.0–0.1)
Basophils Relative: 0 %
Eosinophils Absolute: 0.1 10*3/uL (ref 0.0–0.7)
Eosinophils Relative: 5 %
HEMATOCRIT: 30.2 % — AB (ref 39.0–52.0)
Hemoglobin: 9.6 g/dL — ABNORMAL LOW (ref 13.0–17.0)
LYMPHS ABS: 0.5 10*3/uL — AB (ref 0.7–4.0)
LYMPHS PCT: 18 %
MCH: 27.8 pg (ref 26.0–34.0)
MCHC: 31.8 g/dL (ref 30.0–36.0)
MCV: 87.5 fL (ref 78.0–100.0)
Monocytes Absolute: 0.5 10*3/uL (ref 0.1–1.0)
Monocytes Relative: 19 %
NEUTROS ABS: 1.4 10*3/uL — AB (ref 1.7–7.7)
NEUTROS PCT: 58 %
PLATELETS: 154 10*3/uL (ref 150–400)
RBC: 3.45 MIL/uL — AB (ref 4.22–5.81)
RDW: 16 % — ABNORMAL HIGH (ref 11.5–15.5)
WBC: 2.5 10*3/uL — AB (ref 4.0–10.5)

## 2014-12-24 LAB — COMPREHENSIVE METABOLIC PANEL
ALK PHOS: 106 U/L (ref 38–126)
ALT: 11 U/L — AB (ref 17–63)
AST: 17 U/L (ref 15–41)
Albumin: 3.3 g/dL — ABNORMAL LOW (ref 3.5–5.0)
Anion gap: 6 (ref 5–15)
BILIRUBIN TOTAL: 0.3 mg/dL (ref 0.3–1.2)
BUN: 14 mg/dL (ref 6–20)
CALCIUM: 8.8 mg/dL — AB (ref 8.9–10.3)
CHLORIDE: 102 mmol/L (ref 101–111)
CO2: 28 mmol/L (ref 22–32)
CREATININE: 0.85 mg/dL (ref 0.61–1.24)
GFR calc Af Amer: >60 mL/min (ref >60)
Glucose, Bld: 125 mg/dL — ABNORMAL HIGH (ref 65–99)
Potassium: 3.9 mmol/L (ref 3.5–5.1)
Sodium: 136 mmol/L (ref 135–145)
Total Protein: 6.9 g/dL (ref 6.5–8.1)

## 2014-12-24 MED ORDER — HEPARIN SOD (PORK) LOCK FLUSH 100 UNIT/ML IV SOLN
500.0000 [IU] | Freq: Once | INTRAVENOUS | Status: AC
  Administered 2014-12-24: 500 [IU] via INTRAVENOUS

## 2014-12-24 MED ORDER — SODIUM CHLORIDE 0.9 % IJ SOLN
10.0000 mL | Freq: Once | INTRAMUSCULAR | Status: AC
  Administered 2014-12-24: 10 mL via INTRAVENOUS

## 2014-12-24 MED ORDER — HEPARIN SOD (PORK) LOCK FLUSH 100 UNIT/ML IV SOLN
INTRAVENOUS | Status: AC
  Filled 2014-12-24: qty 5

## 2014-12-24 MED ORDER — HYDROCODONE-ACETAMINOPHEN 10-325 MG PO TABS: 1.0000 | ORAL_TABLET | ORAL | Status: DC | PRN

## 2014-12-24 NOTE — Progress Notes (Signed)
Anthony Cameron presented for Portacath access and flush. Proper placement of portacath confirmed by CXR. Portacath located right chest wall accessed with  H 20 needle. Good blood return present. Portacath flushed with 87m NS and 500U/545mHeparin and needle removed intact. Procedure without incident. Patient tolerated procedure well.
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
## 2014-12-24 NOTE — Assessment & Plan Note (Signed)
Stage IIIA esophageal carcinoma.   He is S/P weekly carboplatin/paclitaxel with radiation therapy finishing in June 2016.  Unfortunately, PET scan demonstrated a hypermetabolic superior mediastinal paraesophageal lesion.  As a result, he went back on treatment with Xeloda + XRT.  He completed XRT.  He is now continued on Xeloda as he is not an operative candidate.  Oncology history is updated.  He is tolerating therapy well thus far.  He will continue with Xeloda 1650 mg BID 7 days on and 7 days off.  He is scheduled to restart Xeloda on Friday, 12/27/2014.  Labs today: CBC diff, CMET  Labs in 2 weeks: CBC diff, CMET  Labs in 4 weeks: CBC diff, CMET  Refill on Hydrocodone provided today.  We will need to set him up for a PET at his next follow-up appointment.  He will have this done 6-8 weeks out from XRT.    Return in 4 weeks for follow-up, at which time, we will get him set-up for PET imaging.

## 2014-12-24 NOTE — Patient Instructions (Signed)
[place] at [facility] Discharge Instructions  RECOMMENDATIONS MADE BY THE CONSULTANT AND ANY TEST RESULTS WILL BE SENT TO YOUR REFERRING PHYSICIAN.  Port flush today. Port flush every 6-8 weeks. Lab work every 2 weeks. MD appointment in 1 month. Continue Xeloda as prescribed. You were given a Lenoria Narine prescription for hydrocodone. Return as scheduled.  Thank you for choosing [place] at [facility] to provide your oncology and hematology care.  To afford each patient quality time with our provider, please arrive at least 15 minutes before your scheduled appointment time.    You need to re-schedule your appointment should you arrive 10 or more minutes late.  We strive to give you quality time with our providers, and arriving late affects you and other patients whose appointments are after yours.  Also, if you no show three or more times for appointments you may be dismissed from the clinic at the providers discretion.     Again, thank you for choosing [facility].  Our hope is that these requests will decrease the amount of time that you wait before being seen by our physicians.       _____________________________________________________________  Should you have questions after your visit to [facility], please contact our office at [phone] between the hours of 8:30 a.m. and 4:30 p.m.  Voicemails left after 4:30 p.m. will not be returned until the following business day.  For prescription refill requests, have your pharmacy contact our office.

## 2014-12-26 ENCOUNTER — Other Ambulatory Visit (HOSPITAL_COMMUNITY): Payer: Self-pay

## 2014-12-26 DIAGNOSIS — C159 Malignant neoplasm of esophagus, unspecified: Secondary | ICD-10-CM

## 2014-12-31 ENCOUNTER — Encounter (HOSPITAL_BASED_OUTPATIENT_CLINIC_OR_DEPARTMENT_OTHER): Payer: Medicaid Other

## 2014-12-31 DIAGNOSIS — C159 Malignant neoplasm of esophagus, unspecified: Secondary | ICD-10-CM

## 2014-12-31 LAB — CBC WITH DIFFERENTIAL/PLATELET
BASOS PCT: 1 %
Basophils Absolute: 0 10*3/uL (ref 0.0–0.1)
EOS ABS: 0.1 10*3/uL (ref 0.0–0.7)
EOS PCT: 3 %
HCT: 34.5 % — ABNORMAL LOW (ref 39.0–52.0)
Hemoglobin: 11 g/dL — ABNORMAL LOW (ref 13.0–17.0)
LYMPHS ABS: 0.7 10*3/uL (ref 0.7–4.0)
Lymphocytes Relative: 22 %
MCH: 27.9 pg (ref 26.0–34.0)
MCHC: 31.9 g/dL (ref 30.0–36.0)
MCV: 87.6 fL (ref 78.0–100.0)
MONOS PCT: 13 %
Monocytes Absolute: 0.4 10*3/uL (ref 0.1–1.0)
Neutro Abs: 1.8 10*3/uL (ref 1.7–7.7)
Neutrophils Relative %: 61 %
PLATELETS: 168 10*3/uL (ref 150–400)
RBC: 3.94 MIL/uL — ABNORMAL LOW (ref 4.22–5.81)
RDW: 16 % — AB (ref 11.5–15.5)
WBC: 3 10*3/uL — ABNORMAL LOW (ref 4.0–10.5)

## 2015-01-02 NOTE — Progress Notes (Signed)
LABS DRAWN

## 2015-01-06 ENCOUNTER — Telehealth (HOSPITAL_COMMUNITY): Payer: Self-pay | Admitting: *Deleted

## 2015-01-06 NOTE — Telephone Encounter (Signed)
Patient's sister called today to let us know that he is not taking his Xeloda right. Patient's sister said he was supposed to stop taking his Xeloda after 01/01/15 night dosage. He was to be off of the Xeloda the 17th - 23rd. But patient has been taking Xeloda - it just isn't clear how. Patient to bring Xeloda in tomorrow at lab appt and calendar. Nurse to review with him then. Next dosage to start on 01/09/15 am.

## 2015-01-07 ENCOUNTER — Telehealth (HOSPITAL_COMMUNITY): Payer: Self-pay | Admitting: *Deleted

## 2015-01-07 ENCOUNTER — Encounter (HOSPITAL_BASED_OUTPATIENT_CLINIC_OR_DEPARTMENT_OTHER): Payer: Medicaid Other

## 2015-01-07 DIAGNOSIS — C159 Malignant neoplasm of esophagus, unspecified: Secondary | ICD-10-CM | POA: Diagnosis present

## 2015-01-07 LAB — CBC WITH DIFFERENTIAL/PLATELET
BASOS PCT: 0 %
Basophils Absolute: 0 10*3/uL (ref 0.0–0.1)
EOS PCT: 3 %
Eosinophils Absolute: 0.1 10*3/uL (ref 0.0–0.7)
HEMATOCRIT: 33.1 % — AB (ref 39.0–52.0)
Hemoglobin: 10.5 g/dL — ABNORMAL LOW (ref 13.0–17.0)
Lymphocytes Relative: 25 %
Lymphs Abs: 0.6 10*3/uL — ABNORMAL LOW (ref 0.7–4.0)
MCH: 27.6 pg (ref 26.0–34.0)
MCHC: 31.7 g/dL (ref 30.0–36.0)
MCV: 86.9 fL (ref 78.0–100.0)
MONO ABS: 0.3 10*3/uL (ref 0.1–1.0)
MONOS PCT: 12 %
NEUTROS ABS: 1.5 10*3/uL — AB (ref 1.7–7.7)
Neutrophils Relative %: 60 %
PLATELETS: 168 10*3/uL (ref 150–400)
RBC: 3.81 MIL/uL — ABNORMAL LOW (ref 4.22–5.81)
RDW: 17 % — AB (ref 11.5–15.5)
WBC: 2.5 10*3/uL — ABNORMAL LOW (ref 4.0–10.5)

## 2015-01-07 LAB — COMPREHENSIVE METABOLIC PANEL
ALBUMIN: 3.6 g/dL (ref 3.5–5.0)
ALT: 15 U/L — ABNORMAL LOW (ref 17–63)
ANION GAP: 5 (ref 5–15)
AST: 19 U/L (ref 15–41)
Alkaline Phosphatase: 106 U/L (ref 38–126)
BILIRUBIN TOTAL: 0.4 mg/dL (ref 0.3–1.2)
BUN: 14 mg/dL (ref 6–20)
CHLORIDE: 102 mmol/L (ref 101–111)
CO2: 29 mmol/L (ref 22–32)
Calcium: 9.1 mg/dL (ref 8.9–10.3)
Creatinine, Ser: 0.78 mg/dL (ref 0.61–1.24)
GFR calc Af Amer: >60 mL/min (ref >60)
GLUCOSE: 95 mg/dL (ref 65–99)
POTASSIUM: 4.1 mmol/L (ref 3.5–5.1)
Sodium: 136 mmol/L (ref 135–145)
Total Protein: 7.5 g/dL (ref 6.5–8.1)

## 2015-01-07 NOTE — Progress Notes (Signed)
Labs drawn

## 2015-01-07 NOTE — Telephone Encounter (Signed)
New calendar given to patient today. New Xeloda start date is Jan 16 2015. Henrene Hawking notified that patient should not take anymore Xeloda until the new start date of Dec 1.

## 2015-01-20 ENCOUNTER — Encounter (HOSPITAL_COMMUNITY): Payer: Self-pay | Admitting: *Deleted

## 2015-01-20 NOTE — Progress Notes (Signed)
Patient came in today to show me his hands. Pt is taking Xeloda but took the last dose on 01/19/15 due to redness and pain in hands. Patient states that he is taking Xeloda '1650mg'$  in am and '1650mg'$  in pm. He started the Xeloda on 01/16/15 as directed and written on his personal Xeloda calendar. Dr. [name] said to stop Xeloda and we would dose reduce. Patient instructed not to take any more Xeloda pills and he said ok. He will return on Thursday Dec 8 to see Gershon Mussel and Gershon Mussel will address the new Xeloda dosage then. Pt also reports having a new pain in rt shoulder and describes it as achy with shooting pains intermittently.

## 2015-01-23 ENCOUNTER — Encounter (HOSPITAL_BASED_OUTPATIENT_CLINIC_OR_DEPARTMENT_OTHER): Payer: Medicaid Other | Admitting: Oncology

## 2015-01-23 ENCOUNTER — Encounter (HOSPITAL_COMMUNITY): Payer: Medicaid Other | Attending: Hematology & Oncology

## 2015-01-23 ENCOUNTER — Encounter (HOSPITAL_COMMUNITY): Payer: Self-pay | Admitting: Oncology

## 2015-01-23 VITALS — BP 110/55 | HR 54 | Temp 98.4°F | Resp 16 | Wt 144.6 lb

## 2015-01-23 DIAGNOSIS — M79671 Pain in right foot: Secondary | ICD-10-CM

## 2015-01-23 DIAGNOSIS — C159 Malignant neoplasm of esophagus, unspecified: Secondary | ICD-10-CM | POA: Insufficient documentation

## 2015-01-23 DIAGNOSIS — C155 Malignant neoplasm of lower third of esophagus: Secondary | ICD-10-CM

## 2015-01-23 LAB — COMPREHENSIVE METABOLIC PANEL
ALBUMIN: 3.8 g/dL (ref 3.5–5.0)
ALT: 12 U/L — ABNORMAL LOW (ref 17–63)
ANION GAP: 8 (ref 5–15)
AST: 18 U/L (ref 15–41)
Alkaline Phosphatase: 110 U/L (ref 38–126)
BUN: 17 mg/dL (ref 6–20)
CHLORIDE: 101 mmol/L (ref 101–111)
CO2: 27 mmol/L (ref 22–32)
Calcium: 9.2 mg/dL (ref 8.9–10.3)
Creatinine, Ser: 0.78 mg/dL (ref 0.61–1.24)
GFR calc Af Amer: >60 mL/min (ref >60)
GFR calc non Af Amer: >60 mL/min (ref >60)
GLUCOSE: 91 mg/dL (ref 65–99)
POTASSIUM: 4.1 mmol/L (ref 3.5–5.1)
SODIUM: 136 mmol/L (ref 135–145)
TOTAL PROTEIN: 7.9 g/dL (ref 6.5–8.1)
Total Bilirubin: 0.4 mg/dL (ref 0.3–1.2)

## 2015-01-23 LAB — CBC WITH DIFFERENTIAL/PLATELET
BASOS PCT: 0 %
Basophils Absolute: 0 10*3/uL (ref 0.0–0.1)
EOS ABS: 0.1 10*3/uL (ref 0.0–0.7)
Eosinophils Relative: 3 %
HCT: 33 % — ABNORMAL LOW (ref 39.0–52.0)
Hemoglobin: 10.6 g/dL — ABNORMAL LOW (ref 13.0–17.0)
Lymphocytes Relative: 19 %
Lymphs Abs: 0.6 10*3/uL — ABNORMAL LOW (ref 0.7–4.0)
MCH: 28 pg (ref 26.0–34.0)
MCHC: 32.1 g/dL (ref 30.0–36.0)
MCV: 87.3 fL (ref 78.0–100.0)
MONO ABS: 0.4 10*3/uL (ref 0.1–1.0)
MONOS PCT: 13 %
Neutro Abs: 2.1 10*3/uL (ref 1.7–7.7)
Neutrophils Relative %: 65 %
PLATELETS: 188 10*3/uL (ref 150–400)
RBC: 3.78 MIL/uL — ABNORMAL LOW (ref 4.22–5.81)
RDW: 19.2 % — AB (ref 11.5–15.5)
WBC: 3.2 10*3/uL — ABNORMAL LOW (ref 4.0–10.5)

## 2015-01-23 MED ORDER — CAPECITABINE 500 MG PO TABS: ORAL_TABLET | ORAL | Status: DC

## 2015-01-23 MED ORDER — HYDROCODONE-ACETAMINOPHEN 10-325 MG PO TABS: 1.0000 | ORAL_TABLET | ORAL | Status: DC | PRN

## 2015-01-23 MED ORDER — HEPARIN SOD (PORK) LOCK FLUSH 100 UNIT/ML IV SOLN
500.0000 [IU] | Freq: Once | INTRAVENOUS | Status: AC
  Filled 2015-01-23: qty 5

## 2015-01-23 MED ORDER — MORPHINE SULFATE ER 15 MG PO TBCR: 15.0000 mg | EXTENDED_RELEASE_TABLET | Freq: Two times a day (BID) | ORAL | Status: DC

## 2015-01-23 MED ORDER — SODIUM CHLORIDE 0.9 % IJ SOLN
10.0000 mL | INTRAMUSCULAR | Status: AC | PRN
  Administered 2015-01-23: 10 mL via INTRAVENOUS
  Filled 2015-01-23: qty 10

## 2015-01-23 MED ORDER — HEPARIN SOD (PORK) LOCK FLUSH 100 UNIT/ML IV SOLN
500.0000 [IU] | Freq: Once | INTRAVENOUS | Status: AC
  Administered 2015-01-23: 500 [IU] via INTRAVENOUS

## 2015-01-23 MED ORDER — SODIUM CHLORIDE 0.9 % IJ SOLN: 10.0000 mL | INTRAMUSCULAR | Status: AC | PRN

## 2015-01-23 NOTE — Assessment & Plan Note (Signed)
Stage IIIA esophageal carcinoma.   He is S/P weekly carboplatin/paclitaxel with radiation therapy finishing in June 2016.  Unfortunately, PET scan demonstrated a hypermetabolic superior mediastinal paraesophageal lesion.  As a result, he went back on treatment with Xeloda + XRT.  He completed XRT.  He is now continued on Xeloda as he is not an operative candidate.  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  Oncology history is updated.  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  He admits that he got a little messed up with his Xeloda scheduling and prior to Thanksgiving, he took more than he was suppose to (ie did not take a break in therapy as planned).  Xeloda 1650 mg BID was discontinued earlier this week as a result.  Today, his symptoms are completely resolved.  Patient's current findings discussed with Dr. [name].  We will restart his Xeloda at a reduced dose of 1500 mg BID 7 day on and 7 days off.  Message sent to our Nurse Navigator to coordinate this change with the patient, in addition to updating his sister, Lannette Donath.  Labs today: CBC diff, CMET  Labs in 2-3 weeks: CBC diff, CMET  Refill on Hydrocodone and MS contin  Return in 2-3 weeks for follow-up.  He will be due for PET imaging in Feb/March 2017 timeframe.  This will be scheduled and ordered in the near future.

## 2015-01-23 NOTE — Patient Instructions (Signed)
[place] at [facility] Discharge Instructions  RECOMMENDATIONS MADE BY THE CONSULTANT AND ANY TEST RESULTS WILL BE SENT TO YOUR REFERRING PHYSICIAN.  Exam and discussion by Robynn Pane, PA-C Refills for pain medications  Will call you with instruction for your xeloda.  Follow-up in 2 - 3 weeks with labs and office visit.  Thank you for choosing [place] at [facility] to provide your oncology and hematology care.  To afford each patient quality time with our provider, please arrive at least 15 minutes before your scheduled appointment time.    You need to re-schedule your appointment should you arrive 10 or more minutes late.  We strive to give you quality time with our providers, and arriving late affects you and other patients whose appointments are after yours.  Also, if you no show three or more times for appointments you may be dismissed from the clinic at the providers discretion.     Again, thank you for choosing [facility]  Our hope is that these requests will decrease the amount of time that you wait before being seen by our physicians.       _____________________________________________________________  Should you have questions after your visit to [facility], please contact our office at [phone] between the hours of 8:30 a.m. and 4:30 p.m.  Voicemails left after 4:30 p.m. will not be returned until the following business day.  For prescription refill requests, have your pharmacy contact our office.
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
## 2015-01-24 ENCOUNTER — Encounter (HOSPITAL_COMMUNITY): Payer: Self-pay | Admitting: Oncology

## 2015-01-28 ENCOUNTER — Telehealth (HOSPITAL_COMMUNITY): Payer: Self-pay | Admitting: *Deleted

## 2015-01-28 NOTE — Telephone Encounter (Signed)
Patient started back on Xeloda on Sunday Dec 11. Patient states that he is doing ok. Hands aren't red but patient does state that he has a little tingling in the fingers. Pt states that he will call us if he needs [place].

## 2015-01-29 NOTE — Telephone Encounter (Signed)
Thank you.  Oncology history is updated accordingly.  KEFALAS,THOMAS, PA-C

## 2015-02-07 ENCOUNTER — Encounter (HOSPITAL_COMMUNITY): Payer: Medicaid Other

## 2015-02-07 ENCOUNTER — Encounter (HOSPITAL_BASED_OUTPATIENT_CLINIC_OR_DEPARTMENT_OTHER): Payer: Medicaid Other | Admitting: Oncology

## 2015-02-07 ENCOUNTER — Encounter (HOSPITAL_COMMUNITY): Payer: Self-pay | Admitting: Oncology

## 2015-02-07 VITALS — BP 102/60 | HR 75 | Temp 97.5°F | Resp 18 | Wt 146.6 lb

## 2015-02-07 DIAGNOSIS — C155 Malignant neoplasm of lower third of esophagus: Secondary | ICD-10-CM | POA: Diagnosis present

## 2015-02-07 DIAGNOSIS — C159 Malignant neoplasm of esophagus, unspecified: Secondary | ICD-10-CM | POA: Diagnosis not present

## 2015-02-07 LAB — COMPREHENSIVE METABOLIC PANEL
ALBUMIN: 3.9 g/dL (ref 3.5–5.0)
ALK PHOS: 100 U/L (ref 38–126)
ALT: 11 U/L — AB (ref 17–63)
AST: 17 U/L (ref 15–41)
Anion gap: 6 (ref 5–15)
BUN: 14 mg/dL (ref 6–20)
CALCIUM: 9.5 mg/dL (ref 8.9–10.3)
CO2: 29 mmol/L (ref 22–32)
CREATININE: 0.95 mg/dL (ref 0.61–1.24)
Chloride: 103 mmol/L (ref 101–111)
GFR calc non Af Amer: >60 mL/min (ref >60)
GLUCOSE: 96 mg/dL (ref 65–99)
Potassium: 3.8 mmol/L (ref 3.5–5.1)
SODIUM: 138 mmol/L (ref 135–145)
Total Bilirubin: 0.4 mg/dL (ref 0.3–1.2)
Total Protein: 7.9 g/dL (ref 6.5–8.1)

## 2015-02-07 LAB — CBC WITH DIFFERENTIAL/PLATELET
Basophils Absolute: 0 10*3/uL (ref 0.0–0.1)
Basophils Relative: 0 %
EOS ABS: 0.1 10*3/uL (ref 0.0–0.7)
Eosinophils Relative: 3 %
HCT: 34.6 % — ABNORMAL LOW (ref 39.0–52.0)
HEMOGLOBIN: 11 g/dL — AB (ref 13.0–17.0)
LYMPHS ABS: 0.5 10*3/uL — AB (ref 0.7–4.0)
Lymphocytes Relative: 15 %
MCH: 28 pg (ref 26.0–34.0)
MCHC: 31.8 g/dL (ref 30.0–36.0)
MCV: 88 fL (ref 78.0–100.0)
Monocytes Absolute: 0.5 10*3/uL (ref 0.1–1.0)
Monocytes Relative: 16 %
NEUTROS PCT: 66 %
Neutro Abs: 2.2 10*3/uL (ref 1.7–7.7)
Platelets: 156 10*3/uL (ref 150–400)
RBC: 3.93 MIL/uL — AB (ref 4.22–5.81)
RDW: 19.4 % — ABNORMAL HIGH (ref 11.5–15.5)
WBC: 3.3 10*3/uL — AB (ref 4.0–10.5)

## 2015-02-07 NOTE — Assessment & Plan Note (Addendum)
Stage IIIA esophageal carcinoma.   He is S/P weekly carboplatin/paclitaxel with radiation therapy finishing in June 2016.  Unfortunately, PET scan demonstrated a hypermetabolic superior mediastinal paraesophageal lesion.  As a result, he went back on treatment with Xeloda + XRT.  He completed XRT.  He is now continued on Xeloda as he is not an operative candidate.  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  Oncology history is up-to-date.  Staging completed in [facility] problem list.  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  He admits that he got a little "messed up" with his Xeloda scheduling prior to Thanksgiving, he took more than he was suppose to (ie did not take a break in therapy as planned).  Xeloda 1650 mg BID was discontinued as a result with resolution of his symptoms.  Therefore, we restarted Xeloda on 01/26/2015 with a reduced dose.  He is now on Xeloda 1500 mg BID 7 day on and 7 days off.    Labs today: CBC diff, CMET  Labs in 3 weeks: CBC diff, CMET  Return in 3 weeks for follow-up.  He will be due for PET imaging in Feb/March 2017 timeframe.  This will be scheduled and ordered in the near future.

## 2015-02-07 NOTE — Patient Instructions (Addendum)
[place] at [facility] Discharge Instructions  RECOMMENDATIONS MADE BY THE CONSULTANT AND ANY TEST RESULTS WILL BE SENT TO YOUR REFERRING PHYSICIAN.   Exam completed by [name] PA Lab work today Lab work looks good You have some chronic anemia we will continue to watch this, hemoglobin 10.6 If you start having increased symptoms from your xeloda please call us  Xeloda 7 days on and 7days off, if you miss a dose do not  double up on the dose. Just continue taking as prescribed  Scans in the near future  Return to see the doctor in 3 weeks with lab work Please call the clinic if you have any questions or concerns    Thank you for choosing [place] at [facility] to provide your oncology and hematology care.  To afford each patient quality time with our provider, please arrive at least 15 minutes before your scheduled appointment time.    You need to re-schedule your appointment should you arrive 10 or more minutes late.  We strive to give you quality time with our providers, and arriving late affects you and other patients whose appointments are after yours.  Also, if you no show three or more times for appointments you may be dismissed from the clinic at the providers discretion.     Again, thank you for choosing [facility].  Our hope is that these requests will decrease the amount of time that you wait before being seen by our physicians.       _____________________________________________________________  Should you have questions after your visit to [facility], please contact our office at [phone] between the hours of 8:30 a.m. and 4:30 p.m.  Voicemails left after 4:30 p.m. will not be returned until the following business day.  For prescription refill requests, have your pharmacy contact our office.
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
## 2015-02-28 ENCOUNTER — Other Ambulatory Visit (HOSPITAL_COMMUNITY): Payer: Self-pay | Admitting: Hematology & Oncology

## 2015-03-03 ENCOUNTER — Encounter (HOSPITAL_COMMUNITY): Payer: Medicaid Other

## 2015-03-03 ENCOUNTER — Encounter (HOSPITAL_COMMUNITY): Payer: Self-pay | Admitting: Hematology & Oncology

## 2015-03-03 ENCOUNTER — Encounter (HOSPITAL_COMMUNITY): Payer: Medicaid Other | Attending: Hematology & Oncology | Admitting: Hematology & Oncology

## 2015-03-03 VITALS — BP 114/49 | HR 57 | Temp 97.7°F | Resp 16 | Wt 145.9 lb

## 2015-03-03 DIAGNOSIS — R911 Solitary pulmonary nodule: Secondary | ICD-10-CM

## 2015-03-03 DIAGNOSIS — Z87891 Personal history of nicotine dependence: Secondary | ICD-10-CM | POA: Diagnosis not present

## 2015-03-03 DIAGNOSIS — C159 Malignant neoplasm of esophagus, unspecified: Secondary | ICD-10-CM | POA: Diagnosis not present

## 2015-03-03 DIAGNOSIS — M2550 Pain in unspecified joint: Secondary | ICD-10-CM

## 2015-03-03 DIAGNOSIS — C155 Malignant neoplasm of lower third of esophagus: Secondary | ICD-10-CM

## 2015-03-03 DIAGNOSIS — D649 Anemia, unspecified: Secondary | ICD-10-CM | POA: Diagnosis not present

## 2015-03-03 LAB — CBC WITH DIFFERENTIAL/PLATELET
Basophils Absolute: 0 10*3/uL (ref 0.0–0.1)
Basophils Relative: 0 %
EOS PCT: 3 %
Eosinophils Absolute: 0.1 10*3/uL (ref 0.0–0.7)
HCT: 30.9 % — ABNORMAL LOW (ref 39.0–52.0)
Hemoglobin: 10 g/dL — ABNORMAL LOW (ref 13.0–17.0)
LYMPHS ABS: 0.7 10*3/uL (ref 0.7–4.0)
LYMPHS PCT: 25 %
MCH: 28.9 pg (ref 26.0–34.0)
MCHC: 32.4 g/dL (ref 30.0–36.0)
MCV: 89.3 fL (ref 78.0–100.0)
MONO ABS: 0.4 10*3/uL (ref 0.1–1.0)
MONOS PCT: 13 %
Neutro Abs: 1.8 10*3/uL (ref 1.7–7.7)
Neutrophils Relative %: 59 %
PLATELETS: 165 10*3/uL (ref 150–400)
RBC: 3.46 MIL/uL — AB (ref 4.22–5.81)
RDW: 18.5 % — AB (ref 11.5–15.5)
WBC: 3 10*3/uL — ABNORMAL LOW (ref 4.0–10.5)

## 2015-03-03 LAB — COMPREHENSIVE METABOLIC PANEL
ALBUMIN: 3.7 g/dL (ref 3.5–5.0)
ALT: 11 U/L — AB (ref 17–63)
AST: 18 U/L (ref 15–41)
Alkaline Phosphatase: 95 U/L (ref 38–126)
Anion gap: 9 (ref 5–15)
BUN: 17 mg/dL (ref 6–20)
CHLORIDE: 104 mmol/L (ref 101–111)
CO2: 28 mmol/L (ref 22–32)
CREATININE: 0.92 mg/dL (ref 0.61–1.24)
Calcium: 9.3 mg/dL (ref 8.9–10.3)
GFR calc Af Amer: >60 mL/min (ref >60)
GFR calc non Af Amer: >60 mL/min (ref >60)
GLUCOSE: 73 mg/dL (ref 65–99)
POTASSIUM: 4 mmol/L (ref 3.5–5.1)
Sodium: 141 mmol/L (ref 135–145)
Total Bilirubin: 0.3 mg/dL (ref 0.3–1.2)
Total Protein: 7.6 g/dL (ref 6.5–8.1)

## 2015-03-03 MED ORDER — ONDANSETRON HCL 8 MG PO TABS: 8.0000 mg | ORAL_TABLET | Freq: Three times a day (TID) | ORAL | Status: DC | PRN

## 2015-03-03 MED ORDER — HYDROCODONE-ACETAMINOPHEN 10-325 MG PO TABS: 1.0000 | ORAL_TABLET | ORAL | Status: DC | PRN
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
## 2015-03-03 NOTE — Patient Instructions (Addendum)
[place] at [facility] Discharge Instructions  RECOMMENDATIONS MADE BY THE CONSULTANT AND ANY TEST RESULTS WILL BE SENT TO YOUR REFERRING PHYSICIAN.   Exam completed by Dr [name] today Hydrocodone and zofran refilled PET Scans scheduled for you  Continue taking xeloda as prescribed Return to see the doctor POST PET scan with lab work Please call the clinic if you have any questions or concerns    Thank you for choosing [place] at [facility] to provide your oncology and hematology care.  To afford each patient quality time with our provider, please arrive at least 15 minutes before your scheduled appointment time.    You need to re-schedule your appointment should you arrive 10 or more minutes late.  We strive to give you quality time with our providers, and arriving late affects you and other patients whose appointments are after yours.  Also, if you no show three or more times for appointments you may be dismissed from the clinic at the providers discretion.     Again, thank you for choosing [facility].  Our hope is that these requests will decrease the amount of time that you wait before being seen by our physicians.       _____________________________________________________________  Should you have questions after your visit to [facility], please contact our office at [phone] between the hours of 8:30 a.m. and 4:30 p.m.  Voicemails left after 4:30 p.m. will not be returned until the following business day.  For prescription refill requests, have your pharmacy contact our office.

## 2015-03-11 ENCOUNTER — Other Ambulatory Visit (HOSPITAL_COMMUNITY): Payer: Self-pay | Admitting: Oncology

## 2015-03-11 DIAGNOSIS — C159 Malignant neoplasm of esophagus, unspecified: Secondary | ICD-10-CM

## 2015-03-11 MED ORDER — CAPECITABINE 500 MG PO TABS: ORAL_TABLET | ORAL | Status: DC

## 2015-03-13 ENCOUNTER — Encounter (HOSPITAL_COMMUNITY): Payer: Medicaid Other

## 2015-03-14 ENCOUNTER — Other Ambulatory Visit (HOSPITAL_COMMUNITY): Payer: Self-pay | Admitting: Oncology

## 2015-03-14 ENCOUNTER — Ambulatory Visit (HOSPITAL_COMMUNITY)
Admission: RE | Admit: 2015-03-14 | Discharge: 2015-03-14 | Disposition: A | Discharge status: Home / Self Care | Payer: Medicaid Other | Source: Ambulatory Visit | Attending: Oncology | Admitting: Oncology

## 2015-03-14 DIAGNOSIS — R918 Other nonspecific abnormal finding of lung field: Secondary | ICD-10-CM | POA: Insufficient documentation

## 2015-03-14 DIAGNOSIS — I7090 Unspecified atherosclerosis: Secondary | ICD-10-CM | POA: Insufficient documentation

## 2015-03-14 DIAGNOSIS — K769 Liver disease, unspecified: Secondary | ICD-10-CM | POA: Diagnosis not present

## 2015-03-14 DIAGNOSIS — C159 Malignant neoplasm of esophagus, unspecified: Secondary | ICD-10-CM | POA: Diagnosis not present

## 2015-03-14 DIAGNOSIS — I712 Thoracic aortic aneurysm, without rupture: Secondary | ICD-10-CM | POA: Diagnosis not present

## 2015-03-14 DIAGNOSIS — M5186 Other intervertebral disc disorders, lumbar region: Secondary | ICD-10-CM | POA: Diagnosis not present

## 2015-03-14 DIAGNOSIS — C155 Malignant neoplasm of lower third of esophagus: Secondary | ICD-10-CM

## 2015-03-14 DIAGNOSIS — K5641 Fecal impaction: Secondary | ICD-10-CM | POA: Insufficient documentation

## 2015-03-14 MED ORDER — MORPHINE SULFATE ER 15 MG PO TBCR: 15.0000 mg | EXTENDED_RELEASE_TABLET | Freq: Two times a day (BID) | ORAL | Status: DC

## 2015-03-14 MED ORDER — IOHEXOL 300 MG/ML  SOLN
100.0000 mL | Freq: Once | INTRAMUSCULAR | Status: AC | PRN
  Administered 2015-03-14: 100 mL via INTRAVENOUS

## 2015-03-16 NOTE — Assessment & Plan Note (Addendum)
Stage IIIA esophageal carcinoma, squamous cell.   He is S/P weekly carboplatin/paclitaxel with radiation therapy finishing in June 2016.  Unfortunately, PET scan demonstrated a hypermetabolic superior mediastinal paraesophageal lesion.  As a result, he went back on treatment with Xeloda + XRT.  He completed XRT on 12/12/2014.  He is now continued on Xeloda 1500 mg BID 7 days on and 7 days off; he is not an operative candidate.  Restaging CT imaging on 03/14/2015 demonstrates progressive disease with hepatic lesion.  Now Stage IV disease.  Oncology history is updated.  CHL staging in problem list is updated.  I personally reviewed and went over radiographic studies with the patient.  The results are noted within this dictation.  Report is printed and provided to the patient.  Weight is stable over the past 4 months.  Will ask pathology to perform HER2 testing on specimen from April 2016 and request it be sent to for [facility] testing.  I spoke with Suanne Marker (1107 hours) in pathology for this testing.  With his progression of disease, I provided education regarding his upstaging of cancer to Stage IV disease.  This is incurable and he is educated about this.  He does have treatment options, but they are limited.  I discussed treatment options including Irinotecan.  I discussed this treatment plan with him.  He is educated that this is an IV therapy, given in a day 1, 8 every 21 day fashion.  He has a port in place from previous treatment.  I reviewed the risks, benefits, alternative (best supportive care), and side effects of this therapy including, but not limited to, nausea, vomiting, diarrhea, fatigue.  He is educated that he we may add or change therapy minimally based upon pathology results from requested tests above.  He is educated on this and he is interested in this treatment at this time.  He is referred to our Nurse Navigator for chemotherapy teaching.  He will return in 1-2 weeks for  follow-up and start of therapy.
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
## 2015-03-18 ENCOUNTER — Encounter (HOSPITAL_BASED_OUTPATIENT_CLINIC_OR_DEPARTMENT_OTHER): Payer: Medicaid Other | Admitting: Oncology

## 2015-03-18 ENCOUNTER — Encounter (HOSPITAL_COMMUNITY): Payer: Self-pay | Admitting: Oncology

## 2015-03-18 VITALS — BP 102/64 | HR 52 | Temp 98.0°F | Resp 20 | Wt 142.0 lb

## 2015-03-18 DIAGNOSIS — C155 Malignant neoplasm of lower third of esophagus: Secondary | ICD-10-CM

## 2015-03-18 NOTE — Patient Instructions (Signed)
[place] at [facility] Discharge Instructions  RECOMMENDATIONS MADE BY THE CONSULTANT AND ANY TEST RESULTS WILL BE SENT TO YOUR REFERRING PHYSICIAN.  Exam and discussion today with [name], PA. Progression of disease - STOP Xeloda. Chemo teaching with [name] for Irinotecan. Return as scheduled for chemotherapy and office visit.   Thank you for choosing [place] at [facility] to provide your oncology and hematology care.  To afford each patient quality time with our provider, please arrive at least 15 minutes before your scheduled appointment time.   Beginning January 23rd 2017 lab work for the [facility] will be done in the  Main lab at [facility] on 1st floor. If you have a lab appointment with the [name] please come in thru the  Main Entrance and check in at the main information desk  You need to re-schedule your appointment should you arrive 10 or more minutes late.  We strive to give you quality time with our providers, and arriving late affects you and other patients whose appointments are after yours.  Also, if you no show three or more times for appointments you may be dismissed from the clinic at the providers discretion.     Again, thank you for choosing [facility].  Our hope is that these requests will decrease the amount of time that you wait before being seen by our physicians.       _____________________________________________________________  Should you have questions after your visit to [facility], please contact our office at [phone] between the hours of 8:30 a.m. and 4:30 p.m.  Voicemails left after 4:30 p.m. will not be returned until the following business day.  For prescription refill requests, have your pharmacy contact our office.     Irinotecan injection What is this medicine? IRINOTECAN (ir in oh TEE kan ) is a chemotherapy drug. It is used to treat colon and rectal  cancer. This medicine may be used for other purposes; ask your health care provider or pharmacist if you have questions. What should I tell my health care provider before I take this medicine? They need to know if you have any of these conditions: -blood disorders -dehydration -diarrhea -infection (especially a virus infection such as chickenpox, cold sores, or herpes) -liver disease -low blood counts, like low white cell, platelet, or red cell counts -recent or ongoing radiation therapy -an unusual or allergic reaction to irinotecan, sorbitol, other chemotherapy, other medicines, foods, dyes, or preservatives -pregnant or trying to get pregnant -breast-feeding How should I use this medicine? This drug is given as an infusion into a vein. It is administered in a hospital or clinic by a specially trained health care professional. Talk to your pediatrician regarding the use of this medicine in children. Special care may be needed. Overdosage: If you think you have taken too much of this medicine contact a poison control center or emergency room at once. NOTE: This medicine is only for you. Do not share this medicine with others. What if I miss a dose? It is important not to miss your dose. Call your doctor or health care professional if you are unable to keep an appointment. What may interact with this medicine? Do not take this medicine with any of the following medications: -atazanavir -certain medicines for fungal infections like itraconazole and ketoconazole -St. John's Wort This medicine may also interact with the following medications: -dexamethasone -diuretics -laxatives -medicines for seizures like  carbamazepine, mephobarbital, phenobarbital, phenytoin, primidone -medicines to increase blood counts like filgrastim, pegfilgrastim, sargramostim -prochlorperazine -vaccines This list may not describe all possible interactions. Give your health care provider a list of all the  medicines, herbs, non-prescription drugs, or dietary supplements you use. Also tell them if you smoke, drink alcohol, or use illegal drugs. Some items may interact with your medicine. What should I watch for while using this medicine? Your condition will be monitored carefully while you are receiving this medicine. You will need important blood work done while you are taking this medicine. This drug may make you feel generally unwell. This is not uncommon, as chemotherapy can affect healthy cells as well as cancer cells. Report any side effects. Continue your course of treatment even though you feel ill unless your doctor tells you to stop. In some cases, you may be given additional medicines to help with side effects. Follow all directions for their use. You may get drowsy or dizzy. Do not drive, use machinery, or do anything that needs mental alertness until you know how this medicine affects you. Do not stand or sit up quickly, especially if you are an older patient. This reduces the risk of dizzy or fainting spells. Call your doctor or health care professional for advice if you get a fever, chills or sore throat, or other symptoms of a cold or flu. Do not treat yourself. This drug decreases your body's ability to fight infections. Try to avoid being around people who are sick. This medicine may increase your risk to bruise or bleed. Call your doctor or health care professional if you notice any unusual bleeding. Be careful brushing and flossing your teeth or using a toothpick because you may get an infection or bleed more easily. If you have any dental work done, tell your dentist you are receiving this medicine. Avoid taking products that contain aspirin, acetaminophen, ibuprofen, naproxen, or ketoprofen unless instructed by your doctor. These medicines may hide a fever. Do not become pregnant while taking this medicine. Women should inform their doctor if they wish to become pregnant or think they  might be pregnant. There is a potential for serious side effects to an unborn child. Talk to your health care professional or pharmacist for more information. Do not breast-feed an infant while taking this medicine. What side effects may I notice from receiving this medicine? Side effects that you should report to your doctor or health care professional as soon as possible: -allergic reactions like skin rash, itching or hives, swelling of the face, lips, or tongue -low blood counts - this medicine may decrease the number of white blood cells, red blood cells and platelets. You may be at increased risk for infections and bleeding. -signs of infection - fever or chills, cough, sore throat, pain or difficulty passing urine -signs of decreased platelets or bleeding - bruising, pinpoint red spots on the skin, black, tarry stools, blood in the urine -signs of decreased red blood cells - unusually weak or tired, fainting spells, lightheadedness -breathing problems -chest pain -diarrhea -feeling faint or lightheaded, falls -flushing, runny nose, sweating during infusion -mouth sores or pain -pain, swelling, redness or irritation where injected -pain, swelling, warmth in the leg -pain, tingling, numbness in the hands or feet -problems with balance, talking, walking -stomach cramps, pain -trouble passing urine or change in the amount of urine -vomiting as to be unable to hold down drinks or food -yellowing of the eyes or skin Side effects that usually do not  require medical attention (report to your doctor or health care professional if they continue or are bothersome): -constipation -hair loss -headache -loss of appetite -nausea, vomiting -stomach upset This list may not describe all possible side effects. Call your doctor for medical advice about side effects. You may report side effects to FDA at 1-800-FDA-1088. Where should I keep my medicine? This drug is given in a hospital or clinic and  will not be stored at home. NOTE: This sheet is a summary. It may not cover all possible information. If you have questions about this medicine, talk to your doctor, pharmacist, or health care provider.    2016, [employer]. (2012-07-31 16:29:32)

## 2015-03-19 ENCOUNTER — Other Ambulatory Visit (HOSPITAL_COMMUNITY)
Admission: RE | Admit: 2015-03-19 | Discharge: 2015-03-19 | Disposition: A | Discharge status: Home / Self Care | Payer: Medicaid Other | Source: Ambulatory Visit | Attending: Oncology | Admitting: Oncology

## 2015-03-19 ENCOUNTER — Other Ambulatory Visit (HOSPITAL_COMMUNITY): Payer: Self-pay | Admitting: *Deleted

## 2015-03-19 DIAGNOSIS — Z029 Encounter for administrative examinations, unspecified: Secondary | ICD-10-CM | POA: Insufficient documentation

## 2015-03-20 ENCOUNTER — Encounter (HOSPITAL_COMMUNITY): Payer: Medicaid Other

## 2015-03-20 ENCOUNTER — Ambulatory Visit (HOSPITAL_COMMUNITY): Payer: Medicaid Other | Admitting: Oncology

## 2015-03-23 ENCOUNTER — Other Ambulatory Visit (HOSPITAL_COMMUNITY): Payer: Self-pay | Admitting: Hematology & Oncology

## 2015-03-25 NOTE — Patient Instructions (Addendum)
[facility]   CHEMOTHERAPY INSTRUCTIONS  Premeds - Aloxi - for nausea/vomiting prevention/reduction. Emend - for nausea/vomiting prevention/reduction. Dexamethasone - steroid - given to reduce the risk of you having an allergic type reaction to the chemotherapy. Dex can cause you to feel energized, nervous/anxious/jittery, make you have trouble sleeping, and/or make you feel hot/flushed in the face/neck and/or look pink/red in the face/neck. These side effects will pass as the Dex wears off. (takes 30 minutes to infuse) You will receive this weekly prior to chemo.  Cisplatin - this medication is hard on your kidneys. This is why we give you a large amount of fluids through your IV while you are here getting chemo. We also need you drinking 64 oz of fluid (preferably water/decaff fluids) 2 days prior to chemo and for up to 4-5 days after chemo. Drink more if you can. This will help keep your kidneys flushed. This can also cause acute and/or delayed nausea/vomiting. You must take your nausea meds as prescribed if you get nauseated. Do not wait until you start vomiting. This med can also cause peripheral neuropathy (numbness/tingling/burning in hands/fingers/feet/toes). Let us know if this develops so that we can monitor it and treat if necessary. (Takes 1 hour to infuse) You will receive this weekly.   Irinotecan - diarrhea, bone marrow suppression, hair loss, abdominal cramping. This drug can cause early and late diarrhea. Early diarrhea can occur within 24 hours of administration. We recommend Imodium. For diarrhea, you take Imodium 2 tablets @ the onset of diarrhea, and then 1 tablet every 2 hours until 12 hours have passed without a bowel movement. May take 2 tablets @ bedtime and every 4 hours until morning. If diarrhea recurs repeat. Call [facility] and let us know that you are having diarrhea and whether or not the Imodium is working.  (takes 90 minutes to infuse)  You will receive this weekly.     EDUCATIONAL MATERIALS GIVEN AND REVIEWED: Specific Instructions Sheets: Cisplatin, Irinotecan   SELF CARE ACTIVITIES WHILE ON CHEMOTHERAPY: Increase your fluid intake 48 hours prior to treatment and drink at least 2 quarts per day after treatment., No alcohol intake., No aspirin or other medications unless approved by your oncologist., Eat foods that are light and easy to digest., Eat foods at cold or room temperature., No fried, fatty, or spicy foods immediately before or after treatment., Have teeth cleaned professionally before starting treatment. Keep dentures and partial plates clean., Use soft toothbrush and do not use mouthwashes that contain alcohol. Biotene is a good mouthwash that is available at most pharmacies or may be ordered by calling [phone]., Use warm salt water gargles (1 teaspoon salt per 1 quart warm water) before and after meals and at bedtime. Or you may rinse with 2 tablespoons of three -percent hydrogen peroxide mixed in eight ounces of water., Always use sunscreen with SPF (Sun Protection Factor) of 30 or higher., Use your nausea medication as directed to prevent nausea., Use your stool softener or laxative as directed to prevent constipation. and Use your anti-diarrheal medication as directed to stop diarrhea.  Please wash your hands for at least 30 seconds using warm soapy water. Handwashing is the #1 way to prevent the spread of germs. Stay away from sick people or people who are getting over a cold. If you develop respiratory systems such as green/yellow mucus production or productive cough or persistent cough let us know and we will see if you need an  antibiotic. It is a good idea to keep a pair of gloves on when going into grocery stores/[employer] to decrease your risk of coming into contact with germs on the carts, etc. Carry alcohol hand gel with you at all times and use it frequently if out in public. All foods need to be cooked  thoroughly. No raw foods. No medium or undercooked meats, eggs. If your food is cooked medium well, it does not need to be hot pink or saturated with bloody liquid at all. Vegetables and fruits need to be washed/rinsed under the faucet with a dish detergent before being consumed. You can eat raw fruits and vegetables unless we tell you otherwise but it would be best if you cooked them or bought frozen. Do not eat off of salad bars or hot bars unless you really trust the cleanliness of the restaurant. If you need dental work, please let Dr. [name] know before you go for your appointment so that we can coordinate the best possible time for you in regards to your chemo regimen. You need to also let your dentist know that you are actively taking chemo. We may need to do labs prior to your dental appointment. We also want your bowels moving at least every other day. If this is not happening, we need to know so that we can get you on a bowel regimen to help you go. If you are going to have sex, you must wear a condom to protect your partner from potential chemotherapy exposure. This will need to occur for up to 28 days post completion of chemo.      MEDICATIONS: You have been given prescriptions for the following medications:  Zofran/Ondansetron '8mg'$  tablet. Take 1 tablet every 8 hours as needed for nausea/vomiting.   Compazine/Prochlorperazine '10mg'$  tablet. Take 1 tablet every 6 hours as needed for nausea/vomtiing.    SYMPTOMS TO REPORT AS SOON AS POSSIBLE AFTER TREATMENT:  FEVER GREATER THAN 100.5 F  CHILLS WITH OR WITHOUT FEVER  NAUSEA AND VOMITING THAT IS NOT CONTROLLED WITH YOUR NAUSEA MEDICATION  UNUSUAL SHORTNESS OF BREATH  UNUSUAL BRUISING OR BLEEDING  TENDERNESS IN MOUTH AND THROAT WITH OR WITHOUT PRESENCE OF ULCERS  URINARY PROBLEMS  BOWEL PROBLEMS  UNUSUAL RASH    Wear comfortable clothing and clothing appropriate for easy access to any Portacath or PICC line. Let us know if  there is anything that we can do to make your therapy better!      I have been informed and understand all of the instructions given to me and have received a copy. I have been instructed to call the clinic [phone] or my family physician as soon as possible for continued medical care, if indicated. I do not have any more questions at this time but understand that I may call the [facility] or the Patient Navigator at [phone] during office hours should I have questions or need assistance in obtaining follow-up care.           Irinotecan injection What is this medicine? IRINOTECAN (ir in oh TEE kan ) is a chemotherapy drug. It is used to treat colon and rectal cancer. This medicine may be used for other purposes; ask your health care provider or pharmacist if you have questions. What should I tell my health care provider before I take this medicine? They need to know if you have any of these conditions: -blood disorders -dehydration -diarrhea -infection (especially a virus infection such as chickenpox, cold sores,  or herpes) -liver disease -low blood counts, like low white cell, platelet, or red cell counts -recent or ongoing radiation therapy -an unusual or allergic reaction to irinotecan, sorbitol, other chemotherapy, other medicines, foods, dyes, or preservatives -pregnant or trying to get pregnant -breast-feeding How should I use this medicine? This drug is given as an infusion into a vein. It is administered in a hospital or clinic by a specially trained health care professional. Talk to your pediatrician regarding the use of this medicine in children. Special care may be needed. Overdosage: If you think you have taken too much of this medicine contact a poison control center or emergency room at once. NOTE: This medicine is only for you. Do not share this medicine with others. What if I miss a dose? It is important not to miss your dose. Call your doctor or  health care professional if you are unable to keep an appointment. What may interact with this medicine? Do not take this medicine with any of the following medications: -atazanavir -certain medicines for fungal infections like itraconazole and ketoconazole -St. John's Wort This medicine may also interact with the following medications: -dexamethasone -diuretics -laxatives -medicines for seizures like carbamazepine, mephobarbital, phenobarbital, phenytoin, primidone -medicines to increase blood counts like filgrastim, pegfilgrastim, sargramostim -prochlorperazine -vaccines This list may not describe all possible interactions. Give your health care provider a list of all the medicines, herbs, non-prescription drugs, or dietary supplements you use. Also tell them if you smoke, drink alcohol, or use illegal drugs. Some items may interact with your medicine. What should I watch for while using this medicine? Your condition will be monitored carefully while you are receiving this medicine. You will need important blood work done while you are taking this medicine. This drug may make you feel generally unwell. This is not uncommon, as chemotherapy can affect healthy cells as well as cancer cells. Report any side effects. Continue your course of treatment even though you feel ill unless your doctor tells you to stop. In some cases, you may be given additional medicines to help with side effects. Follow all directions for their use. You may get drowsy or dizzy. Do not drive, use machinery, or do anything that needs mental alertness until you know how this medicine affects you. Do not stand or sit up quickly, especially if you are an older patient. This reduces the risk of dizzy or fainting spells. Call your doctor or health care professional for advice if you get a fever, chills or sore throat, or other symptoms of a cold or flu. Do not treat yourself. This drug decreases your body's ability to fight  infections. Try to avoid being around people who are sick. This medicine may increase your risk to bruise or bleed. Call your doctor or health care professional if you notice any unusual bleeding. Be careful brushing and flossing your teeth or using a toothpick because you may get an infection or bleed more easily. If you have any dental work done, tell your dentist you are receiving this medicine. Avoid taking products that contain aspirin, acetaminophen, ibuprofen, naproxen, or ketoprofen unless instructed by your doctor. These medicines may hide a fever. Do not become pregnant while taking this medicine. Women should inform their doctor if they wish to become pregnant or think they might be pregnant. There is a potential for serious side effects to an unborn child. Talk to your health care professional or pharmacist for more information. Do not breast-feed an infant while taking this medicine.  What side effects may I notice from receiving this medicine? Side effects that you should report to your doctor or health care professional as soon as possible: -allergic reactions like skin rash, itching or hives, swelling of the face, lips, or tongue -low blood counts - this medicine may decrease the number of white blood cells, red blood cells and platelets. You may be at increased risk for infections and bleeding. -signs of infection - fever or chills, cough, sore throat, pain or difficulty passing urine -signs of decreased platelets or bleeding - bruising, pinpoint red spots on the skin, black, tarry stools, blood in the urine -signs of decreased red blood cells - unusually weak or tired, fainting spells, lightheadedness -breathing problems -chest pain -diarrhea -feeling faint or lightheaded, falls -flushing, runny nose, sweating during infusion -mouth sores or pain -pain, swelling, redness or irritation where injected -pain, swelling, warmth in the leg -pain, tingling, numbness in the hands or  feet -problems with balance, talking, walking -stomach cramps, pain -trouble passing urine or change in the amount of urine -vomiting as to be unable to hold down drinks or food -yellowing of the eyes or skin Side effects that usually do not require medical attention (report to your doctor or health care professional if they continue or are bothersome): -constipation -hair loss -headache -loss of appetite -nausea, vomiting -stomach upset This list may not describe all possible side effects. Call your doctor for medical advice about side effects. You may report side effects to FDA at 1-800-FDA-1088. Where should I keep my medicine? This drug is given in a hospital or clinic and will not be stored at home. NOTE: This sheet is a summary. It may not cover all possible information. If you have questions about this medicine, talk to your doctor, pharmacist, or health care provider.    2016, Elsevier/Gold Standard. (2012-07-31 16:29:32) Cisplatin injection What is this medicine? CISPLATIN (SIS pla tin) is a chemotherapy drug. It targets fast dividing cells, like cancer cells, and causes these cells to die. This medicine is used to treat many types of cancer like bladder, ovarian, and testicular cancers. This medicine may be used for other purposes; ask your health care provider or pharmacist if you have questions. What should I tell my health care provider before I take this medicine? They need to know if you have any of these conditions: -blood disorders -hearing problems -kidney disease -recent or ongoing radiation therapy -an unusual or allergic reaction to cisplatin, carboplatin, other chemotherapy, other medicines, foods, dyes, or preservatives -pregnant or trying to get pregnant -breast-feeding How should I use this medicine? This drug is given as an infusion into a vein. It is administered in a hospital or clinic by a specially trained health care professional. Talk to your  pediatrician regarding the use of this medicine in children. Special care may be needed. Overdosage: If you think you have taken too much of this medicine contact a poison control center or emergency room at once. NOTE: This medicine is only for you. Do not share this medicine with others. What if I miss a dose? It is important not to miss a dose. Call your doctor or health care professional if you are unable to keep an appointment. What may interact with this medicine? -dofetilide -foscarnet -medicines for seizures -medicines to increase blood counts like filgrastim, pegfilgrastim, sargramostim -probenecid -pyridoxine used with altretamine -rituximab -some antibiotics like amikacin, gentamicin, neomycin, polymyxin B, streptomycin, tobramycin -sulfinpyrazone -vaccines -zalcitabine Talk to your doctor or health care professional  before taking any of these medicines: -acetaminophen -aspirin -ibuprofen -ketoprofen -naproxen This list may not describe all possible interactions. Give your health care provider a list of all the medicines, herbs, non-prescription drugs, or dietary supplements you use. Also tell them if you smoke, drink alcohol, or use illegal drugs. Some items may interact with your medicine. What should I watch for while using this medicine? Your condition will be monitored carefully while you are receiving this medicine. You will need important blood work done while you are taking this medicine. This drug may make you feel generally unwell. This is not uncommon, as chemotherapy can affect healthy cells as well as cancer cells. Report any side effects. Continue your course of treatment even though you feel ill unless your doctor tells you to stop. In some cases, you may be given additional medicines to help with side effects. Follow all directions for their use. Call your doctor or health care professional for advice if you get a fever, chills or sore throat, or other symptoms  of a cold or flu. Do not treat yourself. This drug decreases your body's ability to fight infections. Try to avoid being around people who are sick. This medicine may increase your risk to bruise or bleed. Call your doctor or health care professional if you notice any unusual bleeding. Be careful brushing and flossing your teeth or using a toothpick because you may get an infection or bleed more easily. If you have any dental work done, tell your dentist you are receiving this medicine. Avoid taking products that contain aspirin, acetaminophen, ibuprofen, naproxen, or ketoprofen unless instructed by your doctor. These medicines may hide a fever. Do not become pregnant while taking this medicine. Women should inform their doctor if they wish to become pregnant or think they might be pregnant. There is a potential for serious side effects to an unborn child. Talk to your health care professional or pharmacist for more information. Do not breast-feed an infant while taking this medicine. Drink fluids as directed while you are taking this medicine. This will help protect your kidneys. Call your doctor or health care professional if you get diarrhea. Do not treat yourself. What side effects may I notice from receiving this medicine? Side effects that you should report to your doctor or health care professional as soon as possible: -allergic reactions like skin rash, itching or hives, swelling of the face, lips, or tongue -signs of infection - fever or chills, cough, sore throat, pain or difficulty passing urine -signs of decreased platelets or bleeding - bruising, pinpoint red spots on the skin, black, tarry stools, nosebleeds -signs of decreased red blood cells - unusually weak or tired, fainting spells, lightheadedness -breathing problems -changes in hearing -gout pain -low blood counts - This drug may decrease the number of white blood cells, red blood cells and platelets. You may be at increased risk  for infections and bleeding. -nausea and vomiting -pain, swelling, redness or irritation at the injection site -pain, tingling, numbness in the hands or feet -problems with balance, movement -trouble passing urine or change in the amount of urine Side effects that usually do not require medical attention (report to your doctor or health care professional if they continue or are bothersome): -changes in vision -loss of appetite -metallic taste in the mouth or changes in taste This list may not describe all possible side effects. Call your doctor for medical advice about side effects. You may report side effects to FDA at 1-800-FDA-1088. Where should  I keep my medicine? This drug is given in a hospital or clinic and will not be stored at home. NOTE: This sheet is a summary. It may not cover all possible information. If you have questions about this medicine, talk to your doctor, pharmacist, or health care provider.    2016, Elsevier/Gold Standard. (2007-05-09 14:40:54)

## 2015-03-26 ENCOUNTER — Encounter (HOSPITAL_COMMUNITY): Payer: Medicaid Other | Attending: Hematology & Oncology

## 2015-03-26 DIAGNOSIS — C155 Malignant neoplasm of lower third of esophagus: Secondary | ICD-10-CM

## 2015-03-26 DIAGNOSIS — C159 Malignant neoplasm of esophagus, unspecified: Secondary | ICD-10-CM | POA: Insufficient documentation

## 2015-03-26 NOTE — Progress Notes (Signed)
Chemo teaching done and consent signed for Irinotecan and Cisplatin. Pt to start tx on 03/27/15.

## 2015-03-27 ENCOUNTER — Ambulatory Visit (HOSPITAL_COMMUNITY): Payer: Medicaid Other | Admitting: Oncology

## 2015-03-27 ENCOUNTER — Other Ambulatory Visit (HOSPITAL_COMMUNITY): Payer: Self-pay | Admitting: Oncology

## 2015-03-27 ENCOUNTER — Encounter (HOSPITAL_BASED_OUTPATIENT_CLINIC_OR_DEPARTMENT_OTHER): Payer: Medicaid Other

## 2015-03-27 ENCOUNTER — Encounter (HOSPITAL_COMMUNITY): Payer: Self-pay | Admitting: Oncology

## 2015-03-27 ENCOUNTER — Encounter (HOSPITAL_BASED_OUTPATIENT_CLINIC_OR_DEPARTMENT_OTHER): Payer: Medicaid Other | Admitting: Oncology

## 2015-03-27 VITALS — BP 110/52 | HR 52 | Temp 98.0°F | Resp 16 | Wt 148.0 lb

## 2015-03-27 DIAGNOSIS — C159 Malignant neoplasm of esophagus, unspecified: Secondary | ICD-10-CM | POA: Diagnosis present

## 2015-03-27 DIAGNOSIS — C155 Malignant neoplasm of lower third of esophagus: Secondary | ICD-10-CM

## 2015-03-27 DIAGNOSIS — Z5111 Encounter for antineoplastic chemotherapy: Secondary | ICD-10-CM

## 2015-03-27 LAB — COMPREHENSIVE METABOLIC PANEL
ALBUMIN: 3.5 g/dL (ref 3.5–5.0)
ALK PHOS: 101 U/L (ref 38–126)
ALT: 12 U/L — ABNORMAL LOW (ref 17–63)
ANION GAP: 6 (ref 5–15)
AST: 19 U/L (ref 15–41)
BUN: 14 mg/dL (ref 6–20)
CALCIUM: 9 mg/dL (ref 8.9–10.3)
CHLORIDE: 105 mmol/L (ref 101–111)
CO2: 30 mmol/L (ref 22–32)
Creatinine, Ser: 0.85 mg/dL (ref 0.61–1.24)
GFR calc Af Amer: >60 mL/min (ref >60)
GFR calc non Af Amer: >60 mL/min (ref >60)
GLUCOSE: 133 mg/dL — AB (ref 65–99)
Potassium: 3.8 mmol/L (ref 3.5–5.1)
SODIUM: 141 mmol/L (ref 135–145)
Total Bilirubin: 0.4 mg/dL (ref 0.3–1.2)
Total Protein: 7.2 g/dL (ref 6.5–8.1)

## 2015-03-27 LAB — CBC WITH DIFFERENTIAL/PLATELET
BASOS PCT: 0 %
Basophils Absolute: 0 10*3/uL (ref 0.0–0.1)
EOS ABS: 0.1 10*3/uL (ref 0.0–0.7)
EOS PCT: 2 %
HCT: 32.6 % — ABNORMAL LOW (ref 39.0–52.0)
HEMOGLOBIN: 10.2 g/dL — AB (ref 13.0–17.0)
Lymphocytes Relative: 16 %
Lymphs Abs: 0.5 10*3/uL — ABNORMAL LOW (ref 0.7–4.0)
MCH: 28.7 pg (ref 26.0–34.0)
MCHC: 31.3 g/dL (ref 30.0–36.0)
MCV: 91.6 fL (ref 78.0–100.0)
MONOS PCT: 16 %
Monocytes Absolute: 0.5 10*3/uL (ref 0.1–1.0)
Neutro Abs: 2.1 10*3/uL (ref 1.7–7.7)
Neutrophils Relative %: 66 %
PLATELETS: 161 10*3/uL (ref 150–400)
RBC: 3.56 MIL/uL — AB (ref 4.22–5.81)
RDW: 17.1 % — ABNORMAL HIGH (ref 11.5–15.5)
WBC: 3.1 10*3/uL — AB (ref 4.0–10.5)

## 2015-03-27 LAB — MAGNESIUM: Magnesium: 1.9 mg/dL (ref 1.7–2.4)

## 2015-03-27 MED ORDER — HYDROCODONE-ACETAMINOPHEN 10-325 MG PO TABS
1.0000 | ORAL_TABLET | ORAL | Status: DC | PRN
Start: 2015-03-27 — End: 2015-04-10

## 2015-03-27 MED ORDER — SODIUM CHLORIDE 0.9 % IV SOLN
Freq: Once | INTRAVENOUS | Status: AC
  Administered 2015-03-27: 12:00:00 via INTRAVENOUS

## 2015-03-27 MED ORDER — POTASSIUM CHLORIDE 2 MEQ/ML IV SOLN
Freq: Once | INTRAVENOUS | Status: AC
  Administered 2015-03-27: 10:00:00 via INTRAVENOUS
  Filled 2015-03-27: qty 10

## 2015-03-27 MED ORDER — HEPARIN SOD (PORK) LOCK FLUSH 100 UNIT/ML IV SOLN
500.0000 [IU] | Freq: Once | INTRAVENOUS | Status: AC | PRN
  Administered 2015-03-27: 500 [IU]

## 2015-03-27 MED ORDER — PALONOSETRON HCL INJECTION 0.25 MG/5ML
0.2500 mg | Freq: Once | INTRAVENOUS | Status: AC
  Administered 2015-03-27: 0.25 mg via INTRAVENOUS
  Filled 2015-03-27: qty 5

## 2015-03-27 MED ORDER — ATROPINE SULFATE 1 MG/ML IJ SOLN
0.5000 mg | Freq: Once | INTRAMUSCULAR | Status: AC | PRN
  Administered 2015-03-27: 0.5 mg via INTRAVENOUS
  Filled 2015-03-27: qty 1

## 2015-03-27 MED ORDER — SODIUM CHLORIDE 0.9% FLUSH: 10.0000 mL | INTRAVENOUS | Status: DC | PRN

## 2015-03-27 MED ORDER — IRINOTECAN HCL CHEMO INJECTION 100 MG/5ML
65.0000 mg/m2 | Freq: Once | INTRAVENOUS | Status: AC
  Administered 2015-03-27: 118 mg via INTRAVENOUS
  Filled 2015-03-27: qty 5.9

## 2015-03-27 MED ORDER — SODIUM CHLORIDE 0.9 % IV SOLN
Freq: Once | INTRAVENOUS | Status: AC
  Administered 2015-03-27: 12:00:00 via INTRAVENOUS
  Filled 2015-03-27: qty 5

## 2015-03-27 MED ORDER — SODIUM CHLORIDE 0.9 % IV SOLN
30.0000 mg/m2 | Freq: Once | INTRAVENOUS | Status: AC
  Administered 2015-03-27: 54 mg via INTRAVENOUS
  Filled 2015-03-27: qty 54

## 2015-03-27 NOTE — Progress Notes (Signed)
Tolerated chemo well. Ambulatory on discharge home with sister.

## 2015-03-27 NOTE — Assessment & Plan Note (Addendum)
Stage IIIA esophageal carcinoma, squamous cell, HER2 NEGATIVE.   He is S/P weekly carboplatin/paclitaxel with radiation therapy finishing in June 2016.  Unfortunately, PET scan demonstrated a hypermetabolic superior mediastinal paraesophageal lesion.  As a result, he went back on treatment with Xeloda + XRT.  He completed XRT on 12/12/2014.  He is now S/P Xeloda 1500 mg BID 7 days on and 7 days off as he is not an operative candidate from 01/26/2015- 03/18/2015.  Restaging CT imaging on 03/14/2015 demonstrates progressive disease with hepatic lesion thereby upstaging his disease to a Stage IV status.  Now on weekly Cisplatin/Irinotecan beginning on 03/27/2015.  Oncology history is updated.  She is S/P chemotherapy teaching with our Nurse Navigator, [place].  Consent is ascertained for weekly Cisplatin/Irinotecan. He is starting today.   Refill on pain medication printed today.  Return next week for follow-up and day 8 of cycle 1.

## 2015-03-27 NOTE — Patient Instructions (Signed)
[facility] Discharge Instructions for Patients Receiving Chemotherapy  Today you received the following chemotherapy agents Cisplatin and Irinotecan.  To help prevent nausea and vomiting after your treatment, we encourage you to take your nausea medication as instructed. If you develop nausea and vomiting that is not controlled by your nausea medication, call the clinic. If it is after clinic hours your family physician or the after hours number for the clinic or go to the Emergency Department. BELOW ARE SYMPTOMS THAT SHOULD BE REPORTED IMMEDIATELY:  *FEVER GREATER THAN 101.0 F  *CHILLS WITH OR WITHOUT FEVER  NAUSEA AND VOMITING THAT IS NOT CONTROLLED WITH YOUR NAUSEA MEDICATION  *UNUSUAL SHORTNESS OF BREATH  *UNUSUAL BRUISING OR BLEEDING  TENDERNESS IN MOUTH AND THROAT WITH OR WITHOUT PRESENCE OF ULCERS  *URINARY PROBLEMS  *BOWEL PROBLEMS  UNUSUAL RASH Items with * indicate a potential emergency and should be followed up as soon as possible.  Return as scheduled.  I have been informed and understand all the instructions given to me. I know to contact the clinic, my physician, or go to the Emergency Department if any problems should occur. I do not have any questions at this time, but understand that I may call the clinic during office hours or the Patient Navigator at [phone] should I have any questions or need assistance in obtaining follow up care.    __________________________________________  _____________  __________ Signature of Patient or Authorized Representative            Date                   Time    __________________________________________ Nurse's Signature

## 2015-03-27 NOTE — Progress Notes (Deleted)
1340 Undersigned to lobby to call patient for infusion appointment. Patient immediately begins report of 2 weeks ago he became unable to use his left arm. Noted that patient's speech was impaired as he could not complete sentence, appeared to be stuttering and struggling to speak. Noted also that patient was having what appeared to be facial twitching/spasms. Asked patient how long all of this had been going on and he reported that it started 2 weeks ago but the symptoms come and go. Immediately reported patient condition to [name], [place]. Per his instructions patient was immediately escorted to ED via wheelchair for evaluation CVA.
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
## 2015-03-27 NOTE — Patient Instructions (Signed)
[place] at [facility] Discharge Instructions  RECOMMENDATIONS MADE BY THE CONSULTANT AND ANY TEST RESULTS WILL BE SENT TO YOUR REFERRING PHYSICIAN.  Exam done and seen by [name] PA-C Treatment done today Return next week as scheduled.  Thank you for choosing [place] at [facility] to provide your oncology and hematology care.  To afford each patient quality time with our provider, please arrive at least 15 minutes before your scheduled appointment time.   Beginning January 23rd 2017 lab work for the [facility] will be done in the  Main lab at [facility] on 1st floor. If you have a lab appointment with the [facility] please come in thru the  Main Entrance and check in at the main information desk  You need to re-schedule your appointment should you arrive 10 or more minutes late.  We strive to give you quality time with our providers, and arriving late affects you and other patients whose appointments are after yours.  Also, if you no show three or more times for appointments you may be dismissed from the clinic at the providers discretion.     Again, thank you for choosing [facility].  Our hope is that these requests will decrease the amount of time that you wait before being seen by our physicians.       _____________________________________________________________  Should you have questions after your visit to [facility], please contact our office at [phone] between the hours of 8:30 a.m. and 4:30 p.m.  Voicemails left after 4:30 p.m. will not be returned until the following business day.  For prescription refill requests, have your pharmacy contact our office.

## 2015-04-03 ENCOUNTER — Encounter (HOSPITAL_BASED_OUTPATIENT_CLINIC_OR_DEPARTMENT_OTHER): Payer: Medicaid Other

## 2015-04-03 ENCOUNTER — Encounter (HOSPITAL_BASED_OUTPATIENT_CLINIC_OR_DEPARTMENT_OTHER): Payer: Medicaid Other | Admitting: Oncology

## 2015-04-03 ENCOUNTER — Encounter (HOSPITAL_COMMUNITY): Payer: Self-pay | Admitting: Oncology

## 2015-04-03 VITALS — BP 105/47 | HR 62 | Temp 97.7°F | Resp 16

## 2015-04-03 VITALS — BP 115/53 | HR 65 | Temp 97.6°F | Resp 16 | Wt 147.0 lb

## 2015-04-03 DIAGNOSIS — C155 Malignant neoplasm of lower third of esophagus: Secondary | ICD-10-CM

## 2015-04-03 DIAGNOSIS — Z5111 Encounter for antineoplastic chemotherapy: Secondary | ICD-10-CM

## 2015-04-03 DIAGNOSIS — C159 Malignant neoplasm of esophagus, unspecified: Secondary | ICD-10-CM | POA: Diagnosis not present

## 2015-04-03 LAB — CBC WITH DIFFERENTIAL/PLATELET
Basophils Absolute: 0 10*3/uL (ref 0.0–0.1)
Basophils Relative: 0 %
Eosinophils Absolute: 0.1 10*3/uL (ref 0.0–0.7)
Eosinophils Relative: 2 %
HEMATOCRIT: 30.4 % — AB (ref 39.0–52.0)
HEMOGLOBIN: 10 g/dL — AB (ref 13.0–17.0)
LYMPHS ABS: 0.6 10*3/uL — AB (ref 0.7–4.0)
LYMPHS PCT: 21 %
MCH: 29.6 pg (ref 26.0–34.0)
MCHC: 32.9 g/dL (ref 30.0–36.0)
MCV: 89.9 fL (ref 78.0–100.0)
MONOS PCT: 12 %
Monocytes Absolute: 0.3 10*3/uL (ref 0.1–1.0)
NEUTROS ABS: 1.6 10*3/uL — AB (ref 1.7–7.7)
NEUTROS PCT: 65 %
Platelets: 161 10*3/uL (ref 150–400)
RBC: 3.38 MIL/uL — AB (ref 4.22–5.81)
RDW: 16.3 % — ABNORMAL HIGH (ref 11.5–15.5)
WBC: 2.6 10*3/uL — AB (ref 4.0–10.5)

## 2015-04-03 LAB — COMPREHENSIVE METABOLIC PANEL
ALK PHOS: 100 U/L (ref 38–126)
ALT: 11 U/L — AB (ref 17–63)
ANION GAP: 8 (ref 5–15)
AST: 18 U/L (ref 15–41)
Albumin: 3.6 g/dL (ref 3.5–5.0)
BILIRUBIN TOTAL: 0.4 mg/dL (ref 0.3–1.2)
BUN: 14 mg/dL (ref 6–20)
CALCIUM: 9.1 mg/dL (ref 8.9–10.3)
CO2: 28 mmol/L (ref 22–32)
CREATININE: 0.91 mg/dL (ref 0.61–1.24)
Chloride: 102 mmol/L (ref 101–111)
GFR calc non Af Amer: >60 mL/min (ref >60)
GLUCOSE: 133 mg/dL — AB (ref 65–99)
Potassium: 4 mmol/L (ref 3.5–5.1)
Sodium: 138 mmol/L (ref 135–145)
TOTAL PROTEIN: 7.4 g/dL (ref 6.5–8.1)

## 2015-04-03 LAB — MAGNESIUM: Magnesium: 1.9 mg/dL (ref 1.7–2.4)

## 2015-04-03 MED ORDER — HEPARIN SOD (PORK) LOCK FLUSH 100 UNIT/ML IV SOLN
500.0000 [IU] | Freq: Once | INTRAVENOUS | Status: AC | PRN
  Administered 2015-04-03: 500 [IU]
  Filled 2015-04-03 (×2): qty 5

## 2015-04-03 MED ORDER — PALONOSETRON HCL INJECTION 0.25 MG/5ML
0.2500 mg | Freq: Once | INTRAVENOUS | Status: AC
  Administered 2015-04-03: 0.25 mg via INTRAVENOUS
  Filled 2015-04-03: qty 5

## 2015-04-03 MED ORDER — DEXTROSE-NACL 5-0.45 % IV SOLN
Freq: Once | INTRAVENOUS | Status: AC
  Administered 2015-04-03: 10:00:00 via INTRAVENOUS
  Filled 2015-04-03: qty 10

## 2015-04-03 MED ORDER — ATROPINE SULFATE 1 MG/ML IJ SOLN
0.5000 mg | Freq: Once | INTRAMUSCULAR | Status: AC | PRN
  Administered 2015-04-03: 0.5 mg via INTRAVENOUS
  Filled 2015-04-03: qty 1

## 2015-04-03 MED ORDER — SODIUM CHLORIDE 0.9 % IV SOLN
Freq: Once | INTRAVENOUS | Status: AC
  Administered 2015-04-03: 12:00:00 via INTRAVENOUS

## 2015-04-03 MED ORDER — SODIUM CHLORIDE 0.9 % IV SOLN
30.0000 mg/m2 | Freq: Once | INTRAVENOUS | Status: AC
  Administered 2015-04-03: 54 mg via INTRAVENOUS
  Filled 2015-04-03: qty 54

## 2015-04-03 MED ORDER — SODIUM CHLORIDE 0.9% FLUSH
10.0000 mL | INTRAVENOUS | Status: DC | PRN
  Administered 2015-04-03: 10 mL
  Filled 2015-04-03: qty 10

## 2015-04-03 MED ORDER — IRINOTECAN HCL CHEMO INJECTION 100 MG/5ML
65.0000 mg/m2 | Freq: Once | INTRAVENOUS | Status: AC
  Administered 2015-04-03: 118 mg via INTRAVENOUS
  Filled 2015-04-03: qty 5.9

## 2015-04-03 MED ORDER — SODIUM CHLORIDE 0.9 % IV SOLN
Freq: Once | INTRAVENOUS | Status: AC
  Administered 2015-04-03: 12:00:00 via INTRAVENOUS
  Filled 2015-04-03: qty 5

## 2015-04-03 NOTE — Patient Instructions (Addendum)
[facility] Discharge Instructions for Patients Receiving Chemotherapy   Beginning January 23rd 2017 lab work for the [facility] will be done in the  Main lab at [facility] on 1st floor. If you have a lab appointment with the [facility] please come in thru the  Main Entrance and check in at the main information desk   Today you received the following chemotherapy agents: Cisplatin and Irinotecan.   Please be sure to continue to hydrate with water and non-caffeinated beverages as much as possible to protect your kidneys from the effects of this chemotherapy regimen.     If you develop nausea and vomiting, or diarrhea that is not controlled by your medication, call the clinic.  The clinic phone number is [phone]. Office hours are Monday-Friday 8:30am-5:00pm.  BELOW ARE SYMPTOMS THAT SHOULD BE REPORTED IMMEDIATELY:  *FEVER GREATER THAN 101.0 F  *CHILLS WITH OR WITHOUT FEVER  NAUSEA AND VOMITING THAT IS NOT CONTROLLED WITH YOUR NAUSEA MEDICATION  *UNUSUAL SHORTNESS OF BREATH  *UNUSUAL BRUISING OR BLEEDING  TENDERNESS IN MOUTH AND THROAT WITH OR WITHOUT PRESENCE OF ULCERS  *URINARY PROBLEMS  *BOWEL PROBLEMS  UNUSUAL RASH Items with * indicate a potential emergency and should be followed up as soon as possible. If you have an emergency after office hours please contact your primary care physician or go to the nearest emergency department.  Please call the clinic during office hours if you have any questions or concerns.   You may also contact the Patient Navigator at [phone] should you have any questions or need assistance in obtaining follow up care.

## 2015-04-03 NOTE — Patient Instructions (Addendum)
[facility] Discharge Instructions for Patients Receiving Chemotherapy   Beginning January 23rd 2017 lab work for the [facility] will be done in the  Main lab at [facility] on 1st floor. If you have a lab appointment with the [facility] please come in thru the  Main Entrance and check in at the main information desk   Today you received the following chemotherapy agents:     If you develop nausea and vomiting, or diarrhea that is not controlled by your medication, call the clinic.  Exam and discussion today with [name], PA. Chemotherapy today as planned. Return as scheduled next week for lab work, chemotherapy, and office visit with Dr. [name].    The clinic phone number is [phone]. Office hours are Monday-Friday 8:30am-5:00pm.  BELOW ARE SYMPTOMS THAT SHOULD BE REPORTED IMMEDIATELY:  *FEVER GREATER THAN 101.0 F  *CHILLS WITH OR WITHOUT FEVER  NAUSEA AND VOMITING THAT IS NOT CONTROLLED WITH YOUR NAUSEA MEDICATION  *UNUSUAL SHORTNESS OF BREATH  *UNUSUAL BRUISING OR BLEEDING  TENDERNESS IN MOUTH AND THROAT WITH OR WITHOUT PRESENCE OF ULCERS  *URINARY PROBLEMS  *BOWEL PROBLEMS  UNUSUAL RASH Items with * indicate a potential emergency and should be followed up as soon as possible. If you have an emergency after office hours please contact your primary care physician or go to the nearest emergency department.  Please call the clinic during office hours if you have any questions or concerns.   You may also contact the Patient Navigator at [phone] should you have any questions or need assistance in obtaining follow up care.

## 2015-04-03 NOTE — Assessment & Plan Note (Signed)
Stage IIIA esophageal carcinoma, squamous cell, HER2 NEGATIVE.   He is S/P weekly carboplatin/paclitaxel with radiation therapy finishing in June 2016.  Unfortunately, PET scan demonstrated a hypermetabolic superior mediastinal paraesophageal lesion.  As a result, he went back on treatment with Xeloda + XRT.  He completed XRT on 12/12/2014.  He is now S/P Xeloda 1500 mg BID 7 days on and 7 days off as he is not an operative candidate from 01/26/2015- 03/18/2015.  Restaging CT imaging on 03/14/2015 demonstrates progressive disease with hepatic lesion thereby upstaging his disease to a Stage IV status.  Now on weekly Cisplatin/Irinotecan beginning on 03/27/2015.  Oncology history is up-to-date.  Return next week for follow-up and day 16 of cycle 1.
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
## 2015-04-03 NOTE — Progress Notes (Signed)
Patient tolerated infusion well.  VSS.   

## 2015-04-07 ENCOUNTER — Encounter (HOSPITAL_COMMUNITY): Payer: Self-pay

## 2015-04-10 ENCOUNTER — Ambulatory Visit (HOSPITAL_COMMUNITY): Payer: Medicaid Other | Admitting: Oncology

## 2015-04-10 ENCOUNTER — Encounter (HOSPITAL_COMMUNITY): Payer: Self-pay | Admitting: Hematology & Oncology

## 2015-04-10 ENCOUNTER — Encounter (HOSPITAL_BASED_OUTPATIENT_CLINIC_OR_DEPARTMENT_OTHER): Payer: Medicaid Other | Admitting: Hematology & Oncology

## 2015-04-10 ENCOUNTER — Ambulatory Visit (HOSPITAL_COMMUNITY): Payer: Medicaid Other | Admitting: Hematology & Oncology

## 2015-04-10 ENCOUNTER — Encounter (HOSPITAL_BASED_OUTPATIENT_CLINIC_OR_DEPARTMENT_OTHER): Payer: Medicaid Other

## 2015-04-10 VITALS — BP 106/51 | HR 56 | Temp 98.5°F

## 2015-04-10 VITALS — BP 117/59 | HR 65 | Temp 98.4°F | Wt 144.0 lb

## 2015-04-10 DIAGNOSIS — J189 Pneumonia, unspecified organism: Secondary | ICD-10-CM | POA: Diagnosis not present

## 2015-04-10 DIAGNOSIS — Z5111 Encounter for antineoplastic chemotherapy: Secondary | ICD-10-CM

## 2015-04-10 DIAGNOSIS — C155 Malignant neoplasm of lower third of esophagus: Secondary | ICD-10-CM

## 2015-04-10 DIAGNOSIS — D649 Anemia, unspecified: Secondary | ICD-10-CM

## 2015-04-10 DIAGNOSIS — R918 Other nonspecific abnormal finding of lung field: Secondary | ICD-10-CM | POA: Diagnosis not present

## 2015-04-10 DIAGNOSIS — C159 Malignant neoplasm of esophagus, unspecified: Secondary | ICD-10-CM

## 2015-04-10 LAB — COMPREHENSIVE METABOLIC PANEL
ALBUMIN: 3.9 g/dL (ref 3.5–5.0)
ALK PHOS: 77 U/L (ref 38–126)
ALT: 11 U/L — AB (ref 17–63)
AST: 17 U/L (ref 15–41)
Anion gap: 8 (ref 5–15)
BUN: 15 mg/dL (ref 6–20)
CALCIUM: 9.2 mg/dL (ref 8.9–10.3)
CO2: 27 mmol/L (ref 22–32)
CREATININE: 0.88 mg/dL (ref 0.61–1.24)
Chloride: 105 mmol/L (ref 101–111)
Glucose, Bld: 110 mg/dL — ABNORMAL HIGH (ref 65–99)
Potassium: 3.9 mmol/L (ref 3.5–5.1)
SODIUM: 140 mmol/L (ref 135–145)
Total Bilirubin: 0.5 mg/dL (ref 0.3–1.2)
Total Protein: 7.6 g/dL (ref 6.5–8.1)

## 2015-04-10 LAB — MAGNESIUM: Magnesium: 1.6 mg/dL — ABNORMAL LOW (ref 1.7–2.4)

## 2015-04-10 LAB — CBC WITH DIFFERENTIAL/PLATELET
BASOS PCT: 1 %
Basophils Absolute: 0 10*3/uL (ref 0.0–0.1)
EOS ABS: 0.1 10*3/uL (ref 0.0–0.7)
Eosinophils Relative: 3 %
HEMATOCRIT: 32.3 % — AB (ref 39.0–52.0)
HEMOGLOBIN: 10.6 g/dL — AB (ref 13.0–17.0)
LYMPHS ABS: 0.6 10*3/uL — AB (ref 0.7–4.0)
Lymphocytes Relative: 23 %
MCH: 29.4 pg (ref 26.0–34.0)
MCHC: 32.8 g/dL (ref 30.0–36.0)
MCV: 89.5 fL (ref 78.0–100.0)
MONOS PCT: 5 %
Monocytes Absolute: 0.1 10*3/uL (ref 0.1–1.0)
NEUTROS ABS: 1.9 10*3/uL (ref 1.7–7.7)
NEUTROS PCT: 68 %
Platelets: 159 10*3/uL (ref 150–400)
RBC: 3.61 MIL/uL — AB (ref 4.22–5.81)
RDW: 15.9 % — ABNORMAL HIGH (ref 11.5–15.5)
WBC: 2.7 10*3/uL — AB (ref 4.0–10.5)

## 2015-04-10 MED ORDER — HYDROCODONE-ACETAMINOPHEN 10-325 MG PO TABS: 1.0000 | ORAL_TABLET | ORAL | Status: DC | PRN

## 2015-04-10 MED ORDER — CISPLATIN CHEMO INJECTION 100MG/100ML
30.0000 mg/m2 | Freq: Once | INTRAVENOUS | Status: AC
  Administered 2015-04-10: 54 mg via INTRAVENOUS
  Filled 2015-04-10: qty 54

## 2015-04-10 MED ORDER — SODIUM CHLORIDE 0.9 % IV SOLN
Freq: Once | INTRAVENOUS | Status: AC
  Administered 2015-04-10: 11:00:00 via INTRAVENOUS
  Filled 2015-04-10: qty 5

## 2015-04-10 MED ORDER — ATROPINE SULFATE 1 MG/ML IJ SOLN
0.5000 mg | Freq: Once | INTRAMUSCULAR | Status: AC | PRN
  Administered 2015-04-10: 0.5 mg via INTRAVENOUS
  Filled 2015-04-10: qty 1

## 2015-04-10 MED ORDER — SODIUM CHLORIDE 0.9% FLUSH: 10.0000 mL | INTRAVENOUS | Status: DC | PRN

## 2015-04-10 MED ORDER — MAGNESIUM SULFATE 2 GM/50ML IV SOLN
2.0000 g | Freq: Once | INTRAVENOUS | Status: AC
  Administered 2015-04-10: 2 g via INTRAVENOUS
  Filled 2015-04-10: qty 50

## 2015-04-10 MED ORDER — PALONOSETRON HCL INJECTION 0.25 MG/5ML
0.2500 mg | Freq: Once | INTRAVENOUS | Status: AC
  Administered 2015-04-10: 0.25 mg via INTRAVENOUS
  Filled 2015-04-10: qty 5

## 2015-04-10 MED ORDER — SODIUM CHLORIDE 0.9 % IV SOLN
Freq: Once | INTRAVENOUS | Status: AC
  Administered 2015-04-10: 11:00:00 via INTRAVENOUS

## 2015-04-10 MED ORDER — POTASSIUM CHLORIDE 2 MEQ/ML IV SOLN
Freq: Once | INTRAVENOUS | Status: AC
  Administered 2015-04-10: 09:00:00 via INTRAVENOUS
  Filled 2015-04-10: qty 10

## 2015-04-10 MED ORDER — HEPARIN SOD (PORK) LOCK FLUSH 100 UNIT/ML IV SOLN
500.0000 [IU] | Freq: Once | INTRAVENOUS | Status: AC | PRN
  Administered 2015-04-10: 500 [IU]
  Filled 2015-04-10: qty 5

## 2015-04-10 MED ORDER — MAGNESIUM SULFATE 50 % IJ SOLN
2.0000 g | Freq: Once | INTRAVENOUS | Status: DC
  Filled 2015-04-10: qty 4

## 2015-04-10 MED ORDER — IRINOTECAN HCL CHEMO INJECTION 100 MG/5ML
65.0000 mg/m2 | Freq: Once | INTRAVENOUS | Status: AC
  Administered 2015-04-10: 118 mg via INTRAVENOUS
  Filled 2015-04-10: qty 5.9

## 2015-04-10 NOTE — Patient Instructions (Addendum)
[place] at [facility] Discharge Instructions  RECOMMENDATIONS MADE BY THE CONSULTANT AND ANY TEST RESULTS WILL BE SENT TO YOUR REFERRING PHYSICIAN.   Exam and discussion by Dr [name] today Weekly chemotherapy Try to drink ensure Or try to drink [employer] milkshakes anything to get some calories in Hydrocodone refilled We are trying to see whats going on with your [facility] contin Please let us know if your diarrhea gets worse Return to see the doctor in 1 week Please call the clinic if you have any questions or concerns    Thank you for choosing [place] at [facility] to provide your oncology and hematology care.  To afford each patient quality time with our provider, please arrive at least 15 minutes before your scheduled appointment time.   Beginning January 23rd 2017 lab work for the [facility] will be done in the  Main lab at [facility] on 1st floor. If you have a lab appointment with the [facility] please come in thru the  Main Entrance and check in at the main information desk  You need to re-schedule your appointment should you arrive 10 or more minutes late.  We strive to give you quality time with our providers, and arriving late affects you and other patients whose appointments are after yours.  Also, if you no show three or more times for appointments you may be dismissed from the clinic at the providers discretion.     Again, thank you for choosing [facility].  Our hope is that these requests will decrease the amount of time that you wait before being seen by our physicians.       _____________________________________________________________  Should you have questions after your visit to [facility], please contact our office at [phone] between the hours of 8:30 a.m. and 4:30 p.m.  Voicemails left after 4:30 p.m. will not be returned until the following business day.  For prescription  refill requests, have your pharmacy contact our office.

## 2015-04-10 NOTE — Patient Instructions (Signed)
..  [facility] Discharge Instructions for Patients Receiving Chemotherapy   Beginning January 23rd 2017 lab work for the [facility] will be done in the  Main lab at [facility] on 1st floor. If you have a lab appointment with the [facility] please come in thru the  Main Entrance and check in at the main information desk   Today you received the following chemotherapy agents cisplatin, irinotecan, magnesium     If you develop nausea and vomiting, or diarrhea that is not controlled by your medication, call the clinic.  The clinic phone number is [phone]. Office hours are Monday-Friday 8:30am-5:00pm.  BELOW ARE SYMPTOMS THAT SHOULD BE REPORTED IMMEDIATELY:  *FEVER GREATER THAN 101.0 F  *CHILLS WITH OR WITHOUT FEVER  NAUSEA AND VOMITING THAT IS NOT CONTROLLED WITH YOUR NAUSEA MEDICATION  *UNUSUAL SHORTNESS OF BREATH  *UNUSUAL BRUISING OR BLEEDING  TENDERNESS IN MOUTH AND THROAT WITH OR WITHOUT PRESENCE OF ULCERS  *URINARY PROBLEMS  *BOWEL PROBLEMS  UNUSUAL RASH Items with * indicate a potential emergency and should be followed up as soon as possible. If you have an emergency after office hours please contact your primary care physician or go to the nearest emergency department.  Please call the clinic during office hours if you have any questions or concerns.   You may also contact the Patient Navigator at [phone] should you have any questions or need assistance in obtaining follow up care.

## 2015-04-10 NOTE — Progress Notes (Signed)
Tolerated chemo well. He has been instructed to call with worsening of diarrhea.
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
## 2015-04-11 ENCOUNTER — Other Ambulatory Visit (HOSPITAL_COMMUNITY): Payer: Self-pay | Admitting: Hematology & Oncology

## 2015-04-17 ENCOUNTER — Encounter (HOSPITAL_COMMUNITY): Payer: Medicaid Other | Attending: Hematology & Oncology | Admitting: Hematology & Oncology

## 2015-04-17 ENCOUNTER — Encounter (HOSPITAL_BASED_OUTPATIENT_CLINIC_OR_DEPARTMENT_OTHER): Payer: Medicaid Other

## 2015-04-17 ENCOUNTER — Inpatient Hospital Stay (HOSPITAL_COMMUNITY): Payer: Medicaid Other

## 2015-04-17 ENCOUNTER — Encounter (HOSPITAL_COMMUNITY): Payer: Self-pay | Admitting: Hematology & Oncology

## 2015-04-17 VITALS — BP 112/56 | HR 70 | Temp 98.0°F | Resp 16 | Wt 144.8 lb

## 2015-04-17 DIAGNOSIS — C155 Malignant neoplasm of lower third of esophagus: Secondary | ICD-10-CM | POA: Diagnosis present

## 2015-04-17 DIAGNOSIS — R918 Other nonspecific abnormal finding of lung field: Secondary | ICD-10-CM

## 2015-04-17 DIAGNOSIS — Z5111 Encounter for antineoplastic chemotherapy: Secondary | ICD-10-CM | POA: Diagnosis present

## 2015-04-17 DIAGNOSIS — C159 Malignant neoplasm of esophagus, unspecified: Secondary | ICD-10-CM | POA: Insufficient documentation

## 2015-04-17 DIAGNOSIS — C787 Secondary malignant neoplasm of liver and intrahepatic bile duct: Secondary | ICD-10-CM

## 2015-04-17 DIAGNOSIS — Z95828 Presence of other vascular implants and grafts: Secondary | ICD-10-CM

## 2015-04-17 DIAGNOSIS — J189 Pneumonia, unspecified organism: Secondary | ICD-10-CM | POA: Diagnosis not present

## 2015-04-17 DIAGNOSIS — D649 Anemia, unspecified: Secondary | ICD-10-CM | POA: Diagnosis not present

## 2015-04-17 LAB — CBC WITH DIFFERENTIAL/PLATELET
BASOS PCT: 0 %
Basophils Absolute: 0 10*3/uL (ref 0.0–0.1)
EOS ABS: 0.2 10*3/uL (ref 0.0–0.7)
Eosinophils Relative: 6 %
HCT: 31.3 % — ABNORMAL LOW (ref 39.0–52.0)
HEMOGLOBIN: 10.2 g/dL — AB (ref 13.0–17.0)
Lymphocytes Relative: 20 %
Lymphs Abs: 0.5 10*3/uL — ABNORMAL LOW (ref 0.7–4.0)
MCH: 29.5 pg (ref 26.0–34.0)
MCHC: 32.6 g/dL (ref 30.0–36.0)
MCV: 90.5 fL (ref 78.0–100.0)
MONO ABS: 0.2 10*3/uL (ref 0.1–1.0)
MONOS PCT: 8 %
NEUTROS ABS: 1.6 10*3/uL — AB (ref 1.7–7.7)
Neutrophils Relative %: 66 %
PLATELETS: 125 10*3/uL — AB (ref 150–400)
RBC: 3.46 MIL/uL — ABNORMAL LOW (ref 4.22–5.81)
RDW: 15.9 % — ABNORMAL HIGH (ref 11.5–15.5)
WBC: 2.4 10*3/uL — ABNORMAL LOW (ref 4.0–10.5)

## 2015-04-17 LAB — COMPREHENSIVE METABOLIC PANEL
ALT: 11 U/L — ABNORMAL LOW (ref 17–63)
ANION GAP: 7 (ref 5–15)
AST: 17 U/L (ref 15–41)
Albumin: 3.8 g/dL (ref 3.5–5.0)
Alkaline Phosphatase: 74 U/L (ref 38–126)
BUN: 17 mg/dL (ref 6–20)
CALCIUM: 9 mg/dL (ref 8.9–10.3)
CO2: 27 mmol/L (ref 22–32)
Chloride: 105 mmol/L (ref 101–111)
Creatinine, Ser: 0.95 mg/dL (ref 0.61–1.24)
GFR calc non Af Amer: >60 mL/min (ref >60)
Glucose, Bld: 145 mg/dL — ABNORMAL HIGH (ref 65–99)
Potassium: 4.1 mmol/L (ref 3.5–5.1)
SODIUM: 139 mmol/L (ref 135–145)
TOTAL PROTEIN: 7.1 g/dL (ref 6.5–8.1)
Total Bilirubin: 0.3 mg/dL (ref 0.3–1.2)

## 2015-04-17 LAB — MAGNESIUM: Magnesium: 1.7 mg/dL (ref 1.7–2.4)

## 2015-04-17 MED ORDER — PALONOSETRON HCL INJECTION 0.25 MG/5ML
0.2500 mg | Freq: Once | INTRAVENOUS | Status: AC
  Administered 2015-04-17: 0.25 mg via INTRAVENOUS
  Filled 2015-04-17: qty 5

## 2015-04-17 MED ORDER — SODIUM CHLORIDE 0.9 % IV SOLN
Freq: Once | INTRAVENOUS | Status: AC
  Administered 2015-04-17: 08:00:00 via INTRAVENOUS

## 2015-04-17 MED ORDER — HEPARIN SOD (PORK) LOCK FLUSH 100 UNIT/ML IV SOLN
INTRAVENOUS | Status: AC
  Filled 2015-04-17: qty 5

## 2015-04-17 MED ORDER — SODIUM CHLORIDE 0.9% FLUSH
10.0000 mL | INTRAVENOUS | Status: DC | PRN
  Administered 2015-04-17: 10 mL
  Filled 2015-04-17: qty 10

## 2015-04-17 MED ORDER — POTASSIUM CHLORIDE 2 MEQ/ML IV SOLN
Freq: Once | INTRAVENOUS | Status: AC
  Administered 2015-04-17: 09:00:00 via INTRAVENOUS
  Filled 2015-04-17: qty 10

## 2015-04-17 MED ORDER — IRINOTECAN HCL CHEMO INJECTION 100 MG/5ML
65.0000 mg/m2 | Freq: Once | INTRAVENOUS | Status: AC
  Administered 2015-04-17: 118 mg via INTRAVENOUS
  Filled 2015-04-17: qty 5.9

## 2015-04-17 MED ORDER — ATROPINE SULFATE 1 MG/ML IJ SOLN
0.5000 mg | Freq: Once | INTRAMUSCULAR | Status: AC | PRN
  Administered 2015-04-17: 0.5 mg via INTRAVENOUS
  Filled 2015-04-17: qty 1

## 2015-04-17 MED ORDER — HEPARIN SOD (PORK) LOCK FLUSH 100 UNIT/ML IV SOLN
500.0000 [IU] | Freq: Once | INTRAVENOUS | Status: AC | PRN
  Administered 2015-04-17: 500 [IU]

## 2015-04-17 MED ORDER — SODIUM CHLORIDE 0.9 % IV SOLN
Freq: Once | INTRAVENOUS | Status: AC
  Administered 2015-04-17: 11:00:00 via INTRAVENOUS
  Filled 2015-04-17: qty 5

## 2015-04-17 MED ORDER — CISPLATIN CHEMO INJECTION 100MG/100ML
30.0000 mg/m2 | Freq: Once | INTRAVENOUS | Status: AC
  Administered 2015-04-17: 54 mg via INTRAVENOUS
  Filled 2015-04-17: qty 54

## 2015-04-17 NOTE — Patient Instructions (Addendum)
[facility] Discharge Instructions for Patients Receiving Chemotherapy   Beginning January 23rd 2017 lab work for the [facility] will be done in the  Main lab at [facility] on 1st floor. If you have a lab appointment with the [facility] please come in thru the  Main Entrance and check in at the main information desk   Today you received the following chemotherapy agents Cisplatin and Irinotecan.  To help prevent nausea and vomiting after your treatment, we encourage you to take your nausea medication as instructed. If you develop nausea and vomiting, or diarrhea that is not controlled by your medication, call the clinic. The clinic phone number is [phone]. Office hours are Monday-Friday 8:30am-5:00pm.  BELOW ARE SYMPTOMS THAT SHOULD BE REPORTED IMMEDIATELY:  *FEVER GREATER THAN 101.0 F  *CHILLS WITH OR WITHOUT FEVER  NAUSEA AND VOMITING THAT IS NOT CONTROLLED WITH YOUR NAUSEA MEDICATION  *UNUSUAL SHORTNESS OF BREATH  *UNUSUAL BRUISING OR BLEEDING  TENDERNESS IN MOUTH AND THROAT WITH OR WITHOUT PRESENCE OF ULCERS  *URINARY PROBLEMS  *BOWEL PROBLEMS  UNUSUAL RASH Items with * indicate a potential emergency and should be followed up as soon as possible. If you have an emergency after office hours please contact your primary care physician or go to the nearest emergency department.  Please call the clinic during office hours if you have any questions or concerns.   You may also contact the Patient Navigator at [phone] should you have any questions or need assistance in obtaining follow up care.      Resources For Cancer Patients and their Caregivers ? American Cancer Society: Can assist with transportation, wigs, general needs, runs Look Good Feel Better.        904-390-7317 ? Cancer Care: Provides financial assistance, online support groups, medication/co-pay assistance.  1-800-813-HOPE 579 756 7058) ? [place] Assists [place] cancer patients and their families through emotional , educational and financial support.  501 154 0822 ? [place] DSS Where to apply for food stamps, Medicaid and utility assistance. 228-721-2536 ? [facility]: Transportation to medical appointments. (215) 782-1357 ? Social Security Administration: May apply for disability if have a Stage IV cancer. 978-323-3992 (713)808-6629 ? [employer], Disability and Transit Services: Assists with nutrition, care and transit needs. (443)010-9412

## 2015-04-17 NOTE — Progress Notes (Signed)
Patient reports abdominal cramping and diarrhea starting apx 2 days post last chemo. Reports after diarrhea x 1 day then he took imodium (2 pills initially then 1 additional) and diarrhea subsided. Reports BM back to his normal now.  Tolerated chemo well. Ambulatory on discharge home to self.

## 2015-04-17 NOTE — Patient Instructions (Addendum)
[place] at [facility] Discharge Instructions  RECOMMENDATIONS MADE BY THE CONSULTANT AND ANY TEST RESULTS WILL BE SENT TO YOUR REFERRING PHYSICIAN.   Exam and discussion by Dr [name] today We may add cyramza to your chemotherapy, this will not may you sick, its just to help the two drugs you are already getting. Weekly chemotherapy Please call us if you cant get your diarrhea to stop, you may need to come in for fluids. Return to see the doctor in 1 week Please call the clinic if you have any questions or concerns    Thank you for choosing [place] at [facility] to provide your oncology and hematology care.  To afford each patient quality time with our provider, please arrive at least 15 minutes before your scheduled appointment time.   Beginning January 23rd 2017 lab work for the [facility] will be done in the  Main lab at [facility] on 1st floor. If you have a lab appointment with the [facility] please come in thru the  Main Entrance and check in at the main information desk  You need to re-schedule your appointment should you arrive 10 or more minutes late.  We strive to give you quality time with our providers, and arriving late affects you and other patients whose appointments are after yours.  Also, if you no show three or more times for appointments you may be dismissed from the clinic at the providers discretion.     Again, thank you for choosing [facility].  Our hope is that these requests will decrease the amount of time that you wait before being seen by our physicians.       _____________________________________________________________  Should you have questions after your visit to [facility], please contact our office at [phone] between the hours of 8:30 a.m. and 4:30 p.m.  Voicemails left after 4:30 p.m. will not be returned until the following business day.  For prescription refill  requests, have your pharmacy contact our office.         Resources For Cancer Patients and their Caregivers ? American Cancer Society: Can assist with transportation, wigs, general needs, runs Look Good Feel Better.        (321)261-2112 ? Cancer Care: Provides financial assistance, online support groups, medication/co-pay assistance.  1-800-813-HOPE (416)131-7400) ? [facility] Assists [place] cancer patients and their families through emotional , educational and financial support.  267-371-6789 ? [place] DSS Where to apply for food stamps, Medicaid and utility assistance. 858-145-6189 ? [employer]: Transportation to medical appointments. 667-004-8664 ? Social Security Administration: May apply for disability if have a Stage IV cancer. (214)548-6971 574 134 1247 ? [employer], Disability and Transit Services: Assists with nutrition, care and transit needs. 956-827-3273        Ramucirumab injection What is this medicine? RAMUCIRUMAB (ra mue SIR ue mab) is a monoclonal antibody. It is used to treat stomach cancer, colorectal cancer, or lung cancer. This medicine may be used for other purposes; ask your health care provider or pharmacist if you have questions. What should I tell my health care provider before I take this medicine? They need to know if you have any of these conditions: -bleeding disorders -blood clots -heart disease, including heart failure, heart attack, or chest pain (angina) -high blood pressure -infection (especially a virus infection such as chickenpox, cold sores, or herpes) -protein in your urine -recent surgery -stroke -  an unusual or allergic reaction to ramucirumab, other medicines, foods, dyes, or preservatives -pregnant or trying to get pregnant -breast-feeding How should I use this medicine? This medicine is for infusion into a vein. It is given by a health care professional in a hospital or clinic  setting. Talk to your pediatrician regarding the use of this medicine in children. Special care may be needed. Overdosage: If you think you have taken too much of this medicine contact a poison control center or emergency room at once. NOTE: This medicine is only for you. Do not share this medicine with others. What if I miss a dose? It is important not to miss your dose. Call your doctor or health care professional if you are unable to keep an appointment. What may interact with this medicine? Interactions have not been studied. This list may not describe all possible interactions. Give your health care provider a list of all the medicines, herbs, non-prescription drugs, or dietary supplements you use. Also tell them if you smoke, drink alcohol, or use illegal drugs. Some items may interact with your medicine. What should I watch for while using this medicine? Your condition will be monitored carefully while you are receiving this medicine. You will need to to check your blood pressure and have your blood and urine tested while you are taking this medicine. Your condition will be monitored carefully while you are receiving this medicine. This medicine may increase your risk to bruise or bleed. Call your doctor or health care professional if you notice any unusual bleeding. This medicine may rarely cause 'gastrointestinal perforation' (holes in the stomach, intestines or colon), a serious side effect requiring surgery to repair. This medicine should be started at least 28 days following major surgery and the site of the surgery should be totally healed. Check with your doctor before scheduling dental work or surgery while you are receiving this treatment. Talk to your doctor if you have recently had surgery or if you have a wound that has not healed. Do not become pregnant while taking this medicine or for 3 months after stopping it. Women should inform their doctor if they wish to become pregnant or  think they might be pregnant. There is a potential for serious side effects to an unborn child. Talk to your health care professional or pharmacist for more information. What side effects may I notice from receiving this medicine? Side effects that you should report to your doctor or health care professional as soon as possible: -allergic reactions like skin rash, itching or hives, breathing problems, swelling of the face, lips, or tongue -signs of infection - fever or chills, cough, sore throat -chest pain or chest tightness -confusion -dizziness -feeling faint or lightheaded, falls -severe abdominal pain -severe nausea, vomiting -signs and symptoms of bleeding such as bloody or black, tarry stools; red or dark-brown urine; spitting up blood or brown material that looks like coffee grounds; red spots on the skin; unusual bruising or bleeding from the eye, gums, or nose -signs and symptoms of a blood clot such as breathing problems; changes in vision; chest pain; severe, sudden headache; pain, swelling, warmth in the leg; trouble speaking; sudden numbness or weakness of the face, arm or leg -symptoms of a stroke: change in mental awareness, inability to talk or move one side of the body -trouble walking, dizziness, loss of balance or coordination Side effects that usually do not require medical attention (Report these to your doctor or health care professional if they continue  or are bothersome.): -cold, clammy skin -constipation -diarrhea -headache -nausea, vomiting -stomach pain -unusually slow heartbeat -unusually weak or tired This list may not describe all possible side effects. Call your doctor for medical advice about side effects. You may report side effects to FDA at 1-800-FDA-1088. Where should I keep my medicine? This drug is given in a hospital or clinic and will not be stored at home. NOTE: This sheet is a summary. It may not cover all possible information. If you have  questions about this medicine, talk to your doctor, pharmacist, or health care provider.    2016, [employer]. (2014-04-02 17:37:19)
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
## 2015-04-18 DIAGNOSIS — C787 Secondary malignant neoplasm of liver and intrahepatic bile duct: Secondary | ICD-10-CM | POA: Insufficient documentation

## 2015-04-21 ENCOUNTER — Other Ambulatory Visit (HOSPITAL_COMMUNITY): Payer: Self-pay | Admitting: Oncology

## 2015-04-22 ENCOUNTER — Other Ambulatory Visit (HOSPITAL_COMMUNITY): Payer: Self-pay | Admitting: *Deleted

## 2015-04-22 DIAGNOSIS — C155 Malignant neoplasm of lower third of esophagus: Secondary | ICD-10-CM

## 2015-04-22 MED ORDER — MORPHINE SULFATE ER 15 MG PO TBCR: 15.0000 mg | EXTENDED_RELEASE_TABLET | Freq: Two times a day (BID) | ORAL | Status: DC

## 2015-04-23 ENCOUNTER — Telehealth (HOSPITAL_COMMUNITY): Payer: Self-pay | Admitting: Hematology & Oncology

## 2015-04-23 ENCOUNTER — Other Ambulatory Visit (HOSPITAL_COMMUNITY): Payer: Self-pay | Admitting: Oncology

## 2015-04-23 NOTE — Telephone Encounter (Signed)
PC TO MEDICAID PHARMACY [facility] WITH [facility]NA. ?'D IF [facility] WOULD REQUIRE AN EXCEPTION OR PRE AUTH SINCE IT WILL BE USED OFF LABEL. PER SELENA AS LONG AS THE DRUG IS COVERED UNDER THE PLAN THEY DO NOT GO BY WHAT THE RX IS INDICATED FOR. I WILL ENROLL THE PT IN  [facility] TO INSURE THAT THE CHARGES WILL BE COVERED IF MEDICAID DENIES THE CHARGES. CALL REF# [id number]  [facility] [phone]

## 2015-04-24 ENCOUNTER — Inpatient Hospital Stay (HOSPITAL_COMMUNITY): Payer: Medicaid Other

## 2015-04-24 ENCOUNTER — Encounter (HOSPITAL_COMMUNITY): Payer: Medicaid Other

## 2015-04-24 ENCOUNTER — Encounter (HOSPITAL_BASED_OUTPATIENT_CLINIC_OR_DEPARTMENT_OTHER): Payer: Medicaid Other | Admitting: Hematology & Oncology

## 2015-04-24 ENCOUNTER — Encounter (HOSPITAL_COMMUNITY): Payer: Self-pay | Admitting: Hematology & Oncology

## 2015-04-24 ENCOUNTER — Other Ambulatory Visit (HOSPITAL_COMMUNITY): Payer: Self-pay | Admitting: *Deleted

## 2015-04-24 ENCOUNTER — Encounter (HOSPITAL_BASED_OUTPATIENT_CLINIC_OR_DEPARTMENT_OTHER): Payer: Medicaid Other

## 2015-04-24 VITALS — BP 108/61 | HR 67 | Temp 98.8°F | Resp 16 | Wt 147.6 lb

## 2015-04-24 DIAGNOSIS — D709 Neutropenia, unspecified: Secondary | ICD-10-CM | POA: Diagnosis not present

## 2015-04-24 DIAGNOSIS — K769 Liver disease, unspecified: Secondary | ICD-10-CM

## 2015-04-24 DIAGNOSIS — D649 Anemia, unspecified: Secondary | ICD-10-CM | POA: Diagnosis not present

## 2015-04-24 DIAGNOSIS — C155 Malignant neoplasm of lower third of esophagus: Secondary | ICD-10-CM

## 2015-04-24 DIAGNOSIS — R918 Other nonspecific abnormal finding of lung field: Secondary | ICD-10-CM

## 2015-04-24 DIAGNOSIS — J189 Pneumonia, unspecified organism: Secondary | ICD-10-CM | POA: Diagnosis not present

## 2015-04-24 DIAGNOSIS — C159 Malignant neoplasm of esophagus, unspecified: Secondary | ICD-10-CM | POA: Diagnosis not present

## 2015-04-24 DIAGNOSIS — C787 Secondary malignant neoplasm of liver and intrahepatic bile duct: Secondary | ICD-10-CM

## 2015-04-24 DIAGNOSIS — Z95828 Presence of other vascular implants and grafts: Secondary | ICD-10-CM

## 2015-04-24 LAB — CBC WITH DIFFERENTIAL/PLATELET
BASOS PCT: 1 %
Basophils Absolute: 0 10*3/uL (ref 0.0–0.1)
Eosinophils Absolute: 0.1 10*3/uL (ref 0.0–0.7)
Eosinophils Relative: 4 %
HEMATOCRIT: 30.5 % — AB (ref 39.0–52.0)
HEMOGLOBIN: 10 g/dL — AB (ref 13.0–17.0)
LYMPHS ABS: 0.6 10*3/uL — AB (ref 0.7–4.0)
Lymphocytes Relative: 35 %
MCH: 29.4 pg (ref 26.0–34.0)
MCHC: 32.8 g/dL (ref 30.0–36.0)
MCV: 89.7 fL (ref 78.0–100.0)
MONOS PCT: 5 %
Monocytes Absolute: 0.1 10*3/uL (ref 0.1–1.0)
NEUTROS ABS: 0.9 10*3/uL — AB (ref 1.7–7.7)
NEUTROS PCT: 55 %
Platelets: 77 10*3/uL — ABNORMAL LOW (ref 150–400)
RBC: 3.4 MIL/uL — AB (ref 4.22–5.81)
RDW: 16.1 % — ABNORMAL HIGH (ref 11.5–15.5)
WBC: 1.6 10*3/uL — AB (ref 4.0–10.5)

## 2015-04-24 LAB — COMPREHENSIVE METABOLIC PANEL
ALBUMIN: 3.9 g/dL (ref 3.5–5.0)
ALK PHOS: 75 U/L (ref 38–126)
ALT: 13 U/L — AB (ref 17–63)
ANION GAP: 5 (ref 5–15)
AST: 15 U/L (ref 15–41)
BILIRUBIN TOTAL: 0.4 mg/dL (ref 0.3–1.2)
BUN: 16 mg/dL (ref 6–20)
CO2: 26 mmol/L (ref 22–32)
Calcium: 8.9 mg/dL (ref 8.9–10.3)
Chloride: 106 mmol/L (ref 101–111)
Creatinine, Ser: 0.84 mg/dL (ref 0.61–1.24)
GFR calc Af Amer: >60 mL/min (ref >60)
GFR calc non Af Amer: >60 mL/min (ref >60)
Glucose, Bld: 130 mg/dL — ABNORMAL HIGH (ref 65–99)
Potassium: 4 mmol/L (ref 3.5–5.1)
Sodium: 137 mmol/L (ref 135–145)
TOTAL PROTEIN: 7 g/dL (ref 6.5–8.1)

## 2015-04-24 LAB — MAGNESIUM: MAGNESIUM: 1.7 mg/dL (ref 1.7–2.4)

## 2015-04-24 MED ORDER — POTASSIUM CHLORIDE 2 MEQ/ML IV SOLN
Freq: Once | INTRAVENOUS | Status: AC
  Administered 2015-04-24: 09:00:00 via INTRAVENOUS
  Filled 2015-04-24: qty 10

## 2015-04-24 MED ORDER — HEPARIN SOD (PORK) LOCK FLUSH 100 UNIT/ML IV SOLN
500.0000 [IU] | Freq: Once | INTRAVENOUS | Status: AC | PRN
  Administered 2015-04-24: 500 [IU]
  Filled 2015-04-24: qty 5

## 2015-04-24 MED ORDER — FILGRASTIM 480 MCG/1.6ML IJ SOLN
480.0000 ug | Freq: Once | INTRAMUSCULAR | Status: AC
Start: 2015-04-24 — End: 2015-04-24
  Administered 2015-04-24: 480 ug via SUBCUTANEOUS
  Filled 2015-04-24: qty 1.6

## 2015-04-24 MED ORDER — SODIUM CHLORIDE 0.9 % IV SOLN
Freq: Once | INTRAVENOUS | Status: AC
  Administered 2015-04-24: 09:00:00 via INTRAVENOUS

## 2015-04-24 MED ORDER — MORPHINE SULFATE ER 15 MG PO TBCR: 15.0000 mg | EXTENDED_RELEASE_TABLET | Freq: Two times a day (BID) | ORAL | Status: DC
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
## 2015-04-24 NOTE — Progress Notes (Signed)
No chemo today. Hydration fluids only. Start neupogen injection today.  Anthony Cameron presents today for injection per MD orders. Neupogen 480 mcg administered SQ in left Abdomen. Administration without incident. Patient tolerated well.  Tolerated IV fluids well. Ambulatory on discharge home with sister.

## 2015-04-24 NOTE — Patient Instructions (Addendum)
[place] at [facility] Discharge Instructions  RECOMMENDATIONS MADE BY THE CONSULTANT AND ANY TEST RESULTS WILL BE SENT TO YOUR REFERRING PHYSICIAN.   Exam and discussion by Dr [name] today Your WBC count was low today.  CT Scans in April. You are going to get neupogen, Thursday, Friday, Monday, Tuesday Treatment next week along with your new drug (cyramza) Return to see the doctor in 1 week with treatment  Please call the clinic if you have any questions or concerns    Your white blood cell (WBC) is low.  Please make sure you:  Wash your hands.  Avoid big crowds or people that you know are sick.  If your tempeture is above 100.5 then take tylenol and recheck your tempeture in 1 hour, if it is still above 100.5 call the clinic or if it is after hours please go to the ER.  Wash all fruits and vegetables good before eating them.  Wear a mask if you go out into public.           Thank you for choosing [place] at [facility] to provide your oncology and hematology care.  To afford each patient quality time with our provider, please arrive at least 15 minutes before your scheduled appointment time.   Beginning January 23rd 2017 lab work for the [facility] will be done in the  Main lab at [facility] on 1st floor. If you have a lab appointment with the [facility] please come in thru the  Main Entrance and check in at the main information desk  You need to re-schedule your appointment should you arrive 10 or more minutes late.  We strive to give you quality time with our providers, and arriving late affects you and other patients whose appointments are after yours.  Also, if you no show three or more times for appointments you may be dismissed from the clinic at the providers discretion.     Again, thank you for choosing [facility].  Our hope is that these requests will decrease the amount of time that you  wait before being seen by our physicians.       _____________________________________________________________  Should you have questions after your visit to [facility], please contact our office at [phone] between the hours of 8:30 a.m. and 4:30 p.m.  Voicemails left after 4:30 p.m. will not be returned until the following business day.  For prescription refill requests, have your pharmacy contact our office.         Resources For Cancer Patients and their Caregivers ? American Cancer Society: Can assist with transportation, wigs, general needs, runs Look Good Feel Better.        573-570-2664 ? Cancer Care: Provides financial assistance, online support groups, medication/co-pay assistance.  1-800-813-HOPE 361-731-3589) ? [facility] Assists [place] cancer patients and their families through emotional , educational and financial support.  (952)059-3953 ? [place] DSS Where to apply for food stamps, Medicaid and utility assistance. (502)116-6033 ? [facility]: Transportation to medical appointments. (661) 306-7790 ? Social Security Administration: May apply for disability if have a Stage IV cancer. 636-392-4735 (610)455-3694 ? [employer], Disability and Transit Services: Assists with nutrition, care and transit needs. (929) 625-8364

## 2015-04-25 ENCOUNTER — Encounter (HOSPITAL_BASED_OUTPATIENT_CLINIC_OR_DEPARTMENT_OTHER): Payer: Medicaid Other

## 2015-04-25 DIAGNOSIS — D709 Neutropenia, unspecified: Secondary | ICD-10-CM | POA: Diagnosis not present

## 2015-04-25 DIAGNOSIS — C155 Malignant neoplasm of lower third of esophagus: Secondary | ICD-10-CM

## 2015-04-25 MED ORDER — FILGRASTIM 480 MCG/1.6ML IJ SOLN
480.0000 ug | Freq: Once | INTRAMUSCULAR | Status: AC
  Administered 2015-04-25: 480 ug via SUBCUTANEOUS
  Filled 2015-04-25: qty 1.6

## 2015-04-25 NOTE — Patient Instructions (Signed)
[place] at [facility] Discharge Instructions  RECOMMENDATIONS MADE BY THE CONSULTANT AND ANY TEST RESULTS WILL BE SENT TO YOUR REFERRING PHYSICIAN.  Neupogen injection today. Return as scheduled for neupogen injections. Return as scheduled for office visit and chemotherapy.   Thank you for choosing [place] at [facility] to provide your oncology and hematology care.  To afford each patient quality time with our provider, please arrive at least 15 minutes before your scheduled appointment time.   Beginning January 23rd 2017 lab work for the [facility] will be done in the  Main lab at [facility] on 1st floor. If you have a lab appointment with the [facility] please come in thru the  Main Entrance and check in at the main information desk  You need to re-schedule your appointment should you arrive 10 or more minutes late.  We strive to give you quality time with our providers, and arriving late affects you and other patients whose appointments are after yours.  Also, if you no show three or more times for appointments you may be dismissed from the clinic at the providers discretion.     Again, thank you for choosing [facility].  Our hope is that these requests will decrease the amount of time that you wait before being seen by our physicians.       _____________________________________________________________  Should you have questions after your visit to [facility], please contact our office at [phone] between the hours of 8:30 a.m. and 4:30 p.m.  Voicemails left after 4:30 p.m. will not be returned until the following business day.  For prescription refill requests, have your pharmacy contact our office.         Resources For Cancer Patients and their Caregivers ? American Cancer Society: Can assist with transportation, wigs, general needs, runs Look Good Feel Better.         907-210-0358 ? Cancer Care: Provides financial assistance, online support groups, medication/co-pay assistance.  1-800-813-HOPE (706) 217-6810) ? [place] Assists [place] cancer patients and their families through emotional , educational and financial support.  920-276-0819 ? [place] DSS Where to apply for food stamps, Medicaid and utility assistance. 681-416-5457 ? RCATS: Transportation to medical appointments. (212) 824-3197 ? Social Security Administration: May apply for disability if have a Stage IV cancer. 217-694-1078 8306619495 ? [employer], Disability and Transit Services: Assists with nutrition, care and transit needs. [phone]

## 2015-04-25 NOTE — Progress Notes (Signed)
1400:  Elman Dettman presents today for injection per the provider's orders.  Neupogen administration without incident; see MAR for injection details.  Patient tolerated procedure well and without incident.  No questions or complaints noted at this time.

## 2015-04-28 ENCOUNTER — Encounter (HOSPITAL_BASED_OUTPATIENT_CLINIC_OR_DEPARTMENT_OTHER): Payer: Medicaid Other

## 2015-04-28 DIAGNOSIS — C155 Malignant neoplasm of lower third of esophagus: Secondary | ICD-10-CM

## 2015-04-28 DIAGNOSIS — D709 Neutropenia, unspecified: Secondary | ICD-10-CM | POA: Diagnosis not present

## 2015-04-28 MED ORDER — FILGRASTIM 480 MCG/1.6ML IJ SOLN
480.0000 ug | Freq: Once | INTRAMUSCULAR | Status: AC
  Administered 2015-04-28: 480 ug via SUBCUTANEOUS
  Filled 2015-04-28: qty 1.6

## 2015-04-28 NOTE — Progress Notes (Signed)
Anthony Cameron presents today for injection per MD orders. Neupogen 480 mcg administered SQ in left Abdomen. Administration without incident. Patient tolerated well.

## 2015-04-28 NOTE — Patient Instructions (Signed)
[place] at [facility] Discharge Instructions  RECOMMENDATIONS MADE BY THE CONSULTANT AND ANY TEST RESULTS WILL BE SENT TO YOUR REFERRING PHYSICIAN.  Neupogen 480 mcg injection given today as ordered.  Thank you for choosing [place] at [facility] to provide your oncology and hematology care.  To afford each patient quality time with our provider, please arrive at least 15 minutes before your scheduled appointment time.   Beginning January 23rd 2017 lab work for the [facility] will be done in the  Main lab at [facility] on 1st floor. If you have a lab appointment with the [facility] please come in thru the  Main Entrance and check in at the main information desk  You need to re-schedule your appointment should you arrive 10 or more minutes late.  We strive to give you quality time with our providers, and arriving late affects you and other patients whose appointments are after yours.  Also, if you no show three or more times for appointments you may be dismissed from the clinic at the providers discretion.     Again, thank you for choosing [facility].  Our hope is that these requests will decrease the amount of time that you wait before being seen by our physicians.       _____________________________________________________________  Should you have questions after your visit to [facility], please contact our office at [phone] between the hours of 8:30 a.m. and 4:30 p.m.  Voicemails left after 4:30 p.m. will not be returned until the following business day.  For prescription refill requests, have your pharmacy contact our office.         Resources For Cancer Patients and their Caregivers ? American Cancer Society: Can assist with transportation, wigs, general needs, runs Look Good Feel Better.        ([phone] 161-2391 ? Cancer Care: Provides financial assistance, online support groups,  medication/co-pay assistance.  1-800-813-HOPE 732-607-0823) ? [place] Assists [place] cancer patients and their families through emotional , educational and financial support.  954-220-7472 ? [place] DSS Where to apply for food stamps, Medicaid and utility assistance. (239) 463-1113 ? RCATS: Transportation to medical appointments. 431-766-6854 ? Social Security Administration: May apply for disability if have a Stage IV cancer. [phone])059-2913 (757)553-9709 ? [employer], Disability and Transit Services: Assists with nutrition, care and transit needs. [phone]

## 2015-04-29 ENCOUNTER — Encounter (HOSPITAL_BASED_OUTPATIENT_CLINIC_OR_DEPARTMENT_OTHER): Payer: Medicaid Other

## 2015-04-29 DIAGNOSIS — C155 Malignant neoplasm of lower third of esophagus: Secondary | ICD-10-CM | POA: Diagnosis not present

## 2015-04-29 DIAGNOSIS — D709 Neutropenia, unspecified: Secondary | ICD-10-CM

## 2015-04-29 MED ORDER — FILGRASTIM 480 MCG/1.6ML IJ SOLN
480.0000 ug | Freq: Once | INTRAMUSCULAR | Status: AC
  Administered 2015-04-29: 480 ug via SUBCUTANEOUS
  Filled 2015-04-29: qty 1.6

## 2015-04-29 NOTE — Progress Notes (Signed)
Anthony Cameron presents today for injection per MD orders. Neupogen 415mg administered SQ in left Abdomen. Administration without incident. Patient tolerated well.

## 2015-04-29 NOTE — Patient Instructions (Addendum)
[place]   CHEMOTHERAPY INSTRUCTIONS   Premeds: Benadryl - anti-histamine - given to reduce the risk of an infusional reaction to the Cyramza. Tylenol - to reduce the risk of having a fever related to Cyramza infusion.   Cyramza- (ramucirumab) as a single agent, or in combination with Taxol, is indicated for the treatment of patients with advanced or metastatic gastric or gastroesophageal (GE) junction adenocarcinoma with disease progression on or after prior fluoropyrimidine- or platinum-containing chemotherapy.   CYRAMZA is classified as a monoclonal antibody. Monoclonal antibodies are a relatively new type of "targeted" cancer therapy.   Antibodies are an integral part of the body's immune system. Normally, the body creates antibodies in response to an antigen (such as a protein in a germ) that has entered the body. The antibodies attach to the antigen in order to mark it for destruction by the immune system. To make anti-cancer monoclonal antibodies in the laboratory, scientists analyze specific antigens on the surface of cancer cells (the targets). Then, using animal and human proteins, they create a specific antibody that will attach to the target antigen on the cancer cells. When given to the patient, these monoclonal antibodies will attach to matching antigens like a key fits a lock. Since monoclonal antibodies target only specific cells, they may cause less toxicity to healthy cells. Monoclonal antibody therapy is usually given only for cancers in which antigens (and the respective antibodies) have been identified already.   CYRAMZA is a monoclonal antibody that works by targeting and binding with the vascular endothelial growth factor receptor-2 (VEGFR-2) and blocks the activation of VEGF.  When VEGF is overexpressed, it can contribute to disease. Solid cancers cannot grow beyond a limited size without an adequate blood supply; cancers that can express  VEGF are able to grow and metastasize. Drugs such as CYRAMZA can inhibit VEGF and control or slow down tumor growth by decreasing the blood supply to the tumor.   Most common side effects: fatigue 57%, neutropenia (low white blood cell/neutrophil count) 54%, diarrhea 32%, nose bleeds 31%, high blood pressure 25%, edema in legs 25%, stomatitis (inflammation in the oral mucosa/mouth sores) 20%, protein in the urine 17%, thrombocytopenia (low platelet count) 13%, low blood albumin levels 11%, & gastrointestinal hemorrhage events 10%.  Cyramza will take 1 hour to infuse.    SYMPTOMS TO REPORT AS SOON AS POSSIBLE AFTER TREATMENT:  FEVER GREATER THAN 100.5 F  CHILLS WITH OR WITHOUT FEVER  NAUSEA AND VOMITING THAT IS NOT CONTROLLED WITH YOUR NAUSEA MEDICATION  UNUSUAL SHORTNESS OF BREATH  UNUSUAL BRUISING OR BLEEDING  TENDERNESS IN MOUTH AND THROAT WITH OR WITHOUT PRESENCE OF ULCERS  URINARY PROBLEMS  BOWEL PROBLEMS  UNUSUAL RASH    Wear comfortable clothing and clothing appropriate for easy access to any Portacath or PICC line. Let us know if there is anything that we can do to make your therapy better!      I have been informed and understand all of the instructions given to me and have received a copy. I have been instructed to call the clinic [phone] or my family physician as soon as possible for continued medical care, if indicated. I do not have any more questions at this time but understand that I may call the [facility] or the Patient Navigator at [phone] during office hours should I have questions or need assistance in obtaining follow-up care.            Ramucirumab  injection What is this medicine? RAMUCIRUMAB (ra mue SIR ue mab) is a monoclonal antibody. It is used to treat stomach cancer, colorectal cancer, or lung cancer. This medicine may be used for other purposes; ask your health care provider or pharmacist if you have questions. What  should I tell my health care provider before I take this medicine? They need to know if you have any of these conditions: -bleeding disorders -blood clots -heart disease, including heart failure, heart attack, or chest pain (angina) -high blood pressure -infection (especially a virus infection such as chickenpox, cold sores, or herpes) -protein in your urine -recent surgery -stroke -an unusual or allergic reaction to ramucirumab, other medicines, foods, dyes, or preservatives -pregnant or trying to get pregnant -breast-feeding How should I use this medicine? This medicine is for infusion into a vein. It is given by a health care professional in a hospital or clinic setting. Talk to your pediatrician regarding the use of this medicine in children. Special care may be needed. Overdosage: If you think you have taken too much of this medicine contact a poison control center or emergency room at once. NOTE: This medicine is only for you. Do not share this medicine with others. What if I miss a dose? It is important not to miss your dose. Call your doctor or health care professional if you are unable to keep an appointment. What may interact with this medicine? Interactions have not been studied. This list may not describe all possible interactions. Give your health care provider a list of all the medicines, herbs, non-prescription drugs, or dietary supplements you use. Also tell them if you smoke, drink alcohol, or use illegal drugs. Some items may interact with your medicine. What should I watch for while using this medicine? Your condition will be monitored carefully while you are receiving this medicine. You will need to to check your blood pressure and have your blood and urine tested while you are taking this medicine. Your condition will be monitored carefully while you are receiving this medicine. This medicine may increase your risk to bruise or bleed. Call your doctor or health care  professional if you notice any unusual bleeding. This medicine may rarely cause 'gastrointestinal perforation' (holes in the stomach, intestines or colon), a serious side effect requiring surgery to repair. This medicine should be started at least 28 days following major surgery and the site of the surgery should be totally healed. Check with your doctor before scheduling dental work or surgery while you are receiving this treatment. Talk to your doctor if you have recently had surgery or if you have a wound that has not healed. Do not become pregnant while taking this medicine or for 3 months after stopping it. Women should inform their doctor if they wish to become pregnant or think they might be pregnant. There is a potential for serious side effects to an unborn child. Talk to your health care professional or pharmacist for more information. What side effects may I notice from receiving this medicine? Side effects that you should report to your doctor or health care professional as soon as possible: -allergic reactions like skin rash, itching or hives, breathing problems, swelling of the face, lips, or tongue -signs of infection - fever or chills, cough, sore throat -chest pain or chest tightness -confusion -dizziness -feeling faint or lightheaded, falls -severe abdominal pain -severe nausea, vomiting -signs and symptoms of bleeding such as bloody or black, tarry stools; red or dark-brown urine; spitting up blood  or brown material that looks like coffee grounds; red spots on the skin; unusual bruising or bleeding from the eye, gums, or nose -signs and symptoms of a blood clot such as breathing problems; changes in vision; chest pain; severe, sudden headache; pain, swelling, warmth in the leg; trouble speaking; sudden numbness or weakness of the face, arm or leg -symptoms of a stroke: change in mental awareness, inability to talk or move one side of the body -trouble walking, dizziness, loss of  balance or coordination Side effects that usually do not require medical attention (Report these to your doctor or health care professional if they continue or are bothersome.): -cold, clammy skin -constipation -diarrhea -headache -nausea, vomiting -stomach pain -unusually slow heartbeat -unusually weak or tired This list may not describe all possible side effects. Call your doctor for medical advice about side effects. You may report side effects to FDA at 1-800-FDA-1088. Where should I keep my medicine? This drug is given in a hospital or clinic and will not be stored at home. NOTE: This sheet is a summary. It may not cover all possible information. If you have questions about this medicine, talk to your doctor, pharmacist, or health care provider.    2016, Elsevier/Gold Standard. (2014-04-02 17:37:19)

## 2015-04-29 NOTE — Patient Instructions (Signed)
[place] at [facility] Discharge Instructions  RECOMMENDATIONS MADE BY THE CONSULTANT AND ANY TEST RESULTS WILL BE SENT TO YOUR REFERRING PHYSICIAN.  Neupogen today.    Thank you for choosing [place] at [facility] to provide your oncology and hematology care.  To afford each patient quality time with our provider, please arrive at least 15 minutes before your scheduled appointment time.   Beginning January 23rd 2017 lab work for the [facility] will be done in the  Main lab at [facility] on 1st floor. If you have a lab appointment with the [facility] please come in thru the  Main Entrance and check in at the main information desk  You need to re-schedule your appointment should you arrive 10 or more minutes late.  We strive to give you quality time with our providers, and arriving late affects you and other patients whose appointments are after yours.  Also, if you no show three or more times for appointments you may be dismissed from the clinic at the providers discretion.     Again, thank you for choosing [facility].  Our hope is that these requests will decrease the amount of time that you wait before being seen by our physicians.       _____________________________________________________________  Should you have questions after your visit to [facility], please contact our office at [phone] between the hours of 8:30 a.m. and 4:30 p.m.  Voicemails left after 4:30 p.m. will not be returned until the following business day.  For prescription refill requests, have your pharmacy contact our office.         Resources For Cancer Patients and their Caregivers ? American Cancer Society: Can assist with transportation, wigs, general needs, runs Look Good Feel Better.        ([phone] ? Cancer Care: Provides financial assistance, online support groups, medication/co-pay assistance.   1-800-813-HOPE (573) 668-3656) ? [place] Assists [place] cancer patients and their families through emotional , educational and financial support.  ([phone] ? [place] DSS Where to apply for food stamps, Medicaid and utility assistance. ([phone] ? RCATS: Transportation to medical appointments. 212-169-8473 ? Social Security Administration: May apply for disability if have a Stage IV cancer. 435-413-3036 3078647185 ? [employer], Disability and Transit Services: Assists with nutrition, care and transit needs. [phone]

## 2015-05-01 ENCOUNTER — Ambulatory Visit (HOSPITAL_COMMUNITY): Payer: Medicaid Other | Admitting: Oncology

## 2015-05-01 ENCOUNTER — Encounter (HOSPITAL_COMMUNITY): Payer: Self-pay | Admitting: Oncology

## 2015-05-01 ENCOUNTER — Encounter (HOSPITAL_BASED_OUTPATIENT_CLINIC_OR_DEPARTMENT_OTHER): Payer: Medicaid Other

## 2015-05-01 ENCOUNTER — Encounter (HOSPITAL_COMMUNITY): Payer: Medicaid Other

## 2015-05-01 ENCOUNTER — Encounter (HOSPITAL_BASED_OUTPATIENT_CLINIC_OR_DEPARTMENT_OTHER): Payer: Medicaid Other | Admitting: Oncology

## 2015-05-01 VITALS — BP 116/52 | HR 60 | Temp 97.9°F | Resp 20 | Wt 150.2 lb

## 2015-05-01 DIAGNOSIS — C155 Malignant neoplasm of lower third of esophagus: Secondary | ICD-10-CM | POA: Diagnosis not present

## 2015-05-01 DIAGNOSIS — Z95828 Presence of other vascular implants and grafts: Secondary | ICD-10-CM

## 2015-05-01 DIAGNOSIS — D696 Thrombocytopenia, unspecified: Secondary | ICD-10-CM | POA: Diagnosis not present

## 2015-05-01 DIAGNOSIS — C159 Malignant neoplasm of esophagus, unspecified: Secondary | ICD-10-CM | POA: Diagnosis not present

## 2015-05-01 LAB — CBC WITH DIFFERENTIAL/PLATELET
BASOS ABS: 0 10*3/uL (ref 0.0–0.1)
BASOS PCT: 1 %
EOS ABS: 0 10*3/uL (ref 0.0–0.7)
EOS PCT: 2 %
HCT: 28.5 % — ABNORMAL LOW (ref 39.0–52.0)
Hemoglobin: 9.3 g/dL — ABNORMAL LOW (ref 13.0–17.0)
LYMPHS ABS: 0.7 10*3/uL (ref 0.7–4.0)
LYMPHS PCT: 33 %
MCH: 29.9 pg (ref 26.0–34.0)
MCHC: 32.6 g/dL (ref 30.0–36.0)
MCV: 91.6 fL (ref 78.0–100.0)
MONO ABS: 0.1 10*3/uL (ref 0.1–1.0)
Monocytes Relative: 5 %
Neutro Abs: 1.3 10*3/uL — ABNORMAL LOW (ref 1.7–7.7)
Neutrophils Relative %: 60 %
PLATELETS: 49 10*3/uL — AB (ref 150–400)
RBC: 3.11 MIL/uL — AB (ref 4.22–5.81)
RDW: 16.4 % — AB (ref 11.5–15.5)
WBC: 2.1 10*3/uL — AB (ref 4.0–10.5)

## 2015-05-01 LAB — COMPREHENSIVE METABOLIC PANEL
ALBUMIN: 3.6 g/dL (ref 3.5–5.0)
ALT: 10 U/L — AB (ref 17–63)
AST: 12 U/L — AB (ref 15–41)
Alkaline Phosphatase: 80 U/L (ref 38–126)
Anion gap: 7 (ref 5–15)
BUN: 16 mg/dL (ref 6–20)
CHLORIDE: 103 mmol/L (ref 101–111)
CO2: 27 mmol/L (ref 22–32)
CREATININE: 0.96 mg/dL (ref 0.61–1.24)
Calcium: 8.6 mg/dL — ABNORMAL LOW (ref 8.9–10.3)
GFR calc Af Amer: >60 mL/min (ref >60)
Glucose, Bld: 99 mg/dL (ref 65–99)
POTASSIUM: 4.1 mmol/L (ref 3.5–5.1)
SODIUM: 137 mmol/L (ref 135–145)
Total Bilirubin: 0.5 mg/dL (ref 0.3–1.2)
Total Protein: 6.7 g/dL (ref 6.5–8.1)

## 2015-05-01 LAB — URINALYSIS, DIPSTICK ONLY
BILIRUBIN URINE: NEGATIVE
GLUCOSE, UA: NEGATIVE mg/dL
Ketones, ur: NEGATIVE mg/dL
Leukocytes, UA: NEGATIVE
Nitrite: NEGATIVE
Protein, ur: NEGATIVE mg/dL
SPECIFIC GRAVITY, URINE: 1.02 (ref 1.005–1.030)
pH: 5.5 (ref 5.0–8.0)

## 2015-05-01 LAB — MAGNESIUM: MAGNESIUM: 1.8 mg/dL (ref 1.7–2.4)

## 2015-05-01 LAB — TSH: TSH: 0.285 u[IU]/mL — ABNORMAL LOW (ref 0.350–4.500)

## 2015-05-01 MED ORDER — SODIUM CHLORIDE 0.9 % IV SOLN
Freq: Once | INTRAVENOUS | Status: AC
  Administered 2015-05-01: 09:00:00 via INTRAVENOUS

## 2015-05-01 MED ORDER — HEPARIN SOD (PORK) LOCK FLUSH 100 UNIT/ML IV SOLN
INTRAVENOUS | Status: AC
  Filled 2015-05-01: qty 5

## 2015-05-01 MED ORDER — HEPARIN SOD (PORK) LOCK FLUSH 100 UNIT/ML IV SOLN
500.0000 [IU] | Freq: Once | INTRAVENOUS | Status: AC
  Administered 2015-05-01: 500 [IU] via INTRAVENOUS

## 2015-05-01 MED ORDER — HYDROCODONE-ACETAMINOPHEN 10-325 MG PO TABS: 1.0000 | ORAL_TABLET | ORAL | Status: DC | PRN

## 2015-05-01 MED ORDER — SODIUM CHLORIDE 0.9% FLUSH
10.0000 mL | INTRAVENOUS | Status: DC | PRN
  Administered 2015-05-01: 10 mL
  Filled 2015-05-01: qty 10

## 2015-05-01 MED ORDER — POTASSIUM CHLORIDE 2 MEQ/ML IV SOLN
Freq: Once | INTRAVENOUS | Status: AC
  Administered 2015-05-01: 09:00:00 via INTRAVENOUS
  Filled 2015-05-01: qty 10

## 2015-05-01 NOTE — Progress Notes (Signed)
Defer chemo treatment x 1 week.

## 2015-05-01 NOTE — Assessment & Plan Note (Addendum)
Initially, stage IIIA esophageal carcinoma, squamous cell, HER2 NEGATIVE.   He is S/P weekly carboplatin/paclitaxel with radiation therapy finishing in June 2016 in the curative setting.  Unfortunately, PET scan demonstrated a hypermetabolic superior mediastinal paraesophageal lesion, thereby precluding surgical intervention.  As a result, he went back on treatment with Xeloda + XRT.  He completed XRT on 12/12/2014.  He continued single-agent Xeloda 1500 mg BID 7 days on and 7 days off from 01/26/2015- 03/18/2015.  Restaging CT imaging on 03/14/2015 demonstrated progressive disease with hepatic lesion resulting in an upstaging his disease to a STAGE IV status.  Now on weekly Cisplatin/Irinotecan beginning on 03/27/2015 with the addition of Cyramza on 05/01/2015 every 2 weeks.  Oncology history is updated.  Treatment was held last week secondary to neutropenia and thrombocytopenia.  He was given 4 days worth of Neupogen.    Pre-treatment labs today as ordered: CBC diff, CMET, Magnesium, TSH, and UA dipstick.  With the addition of Cyramza, TSH and urine dipstick will be checked every 2 weeks with Cyramza treatment.  He will have chemotherapy teaching for the addition of Cyramza to his cancer treatment regimen.  I have touched upon some of the major points related to [place] therapy. We discussed increased risk of bleeding in addition to nephrotic range proteinuria. He is educated on other side effects associated with Cyramza.  He is educated this is a medicine that is given IV every 2 weeks. We will monitor urine for proteinuria. He's to advise [place] of any elective surgical interventions in the future. I reviewed the risks, benefits, alternatives, and side effects of this intervention.  I refilled his hydrocodone as he is using more in the absence of MS Contin. He reports are still issues receiving his MS Contin because local pharmacies don't have it in [place]. He's been in contact with Lupita Raider, nurse  navigator, who is been helping him ascertain this medication. He notes he will be treated today from [facility].  His weight is up and his appetite is stable.  Return next week for next treatment and follow-up appointment as scheduled.  His last imaging was at the end of January 2017. Today marks the completion of cycle 1 of therapy. I think it would be reasonable to restage him with imaging following completion of cycle 2 of therapy. We will discuss future imaging moving forward.  ADDENDUM: Thrombocytopenia is noted today at 49,000 and therefore treatment will be held.  ANC has improved and is 1.3.  We will defer treatment x 7 more days.  Will not pursue Neupogen at this time.  Treatment plan and antibody plan adjusted accordingly.
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
## 2015-05-01 NOTE — Progress Notes (Signed)
Teaching done regarding Cyramza. Consent obtained for Irinotecan, Cisplatin, and Cyramza.

## 2015-05-01 NOTE — Patient Instructions (Addendum)
[place] at [facility] Discharge Instructions  RECOMMENDATIONS MADE BY THE CONSULTANT AND ANY TEST RESULTS WILL BE SENT TO YOUR REFERRING PHYSICIAN.  Exam done and seen by [name] Start MS Contin when you get it. Will be adding Cyramza today pending your labs. Will give you a refill on Hydrocodone. Will be talking about doing scans again in April.   Thank you for choosing [place] at [facility] to provide your oncology and hematology care.  To afford each patient quality time with our provider, please arrive at least 15 minutes before your scheduled appointment time.   Beginning January 23rd 2017 lab work for the [facility] will be done in the  Main lab at [facility] on 1st floor. If you have a lab appointment with the [facility] please come in thru the  Main Entrance and check in at the main information desk  You need to re-schedule your appointment should you arrive 10 or more minutes late.  We strive to give you quality time with our providers, and arriving late affects you and other patients whose appointments are after yours.  Also, if you no show three or more times for appointments you may be dismissed from the clinic at the providers discretion.     Again, thank you for choosing [facility].  Our hope is that these requests will decrease the amount of time that you wait before being seen by our physicians.       _____________________________________________________________  Should you have questions after your visit to [facility], please contact our office at [phone] between the hours of 8:30 a.m. and 4:30 p.m.  Voicemails left after 4:30 p.m. will not be returned until the following business day.  For prescription refill requests, have your pharmacy contact our office.         Resources For Cancer Patients and their Caregivers ? American Cancer Society: Can assist with  transportation, wigs, general needs, runs Look Good Feel Better.        640-204-5489 ? Cancer Care: Provides financial assistance, online support groups, medication/co-pay assistance.  1-800-813-HOPE 985-746-2754) ? [place] Assists [place] cancer patients and their families through emotional , educational and financial support.  6708443781 ? [place] DSS Where to apply for food stamps, Medicaid and utility assistance. 415-861-2559 ? [facility]: Transportation to medical appointments. 670-268-3804 ? Social Security Administration: May apply for disability if have a Stage IV cancer. 309 623 1444 320-740-9309 ? [employer], Disability and Transit Services: Assists with nutrition, care and transit needs. [phone]

## 2015-05-07 NOTE — Assessment & Plan Note (Addendum)
Initially, stage IIIA esophageal carcinoma, squamous cell, HER2 NEGATIVE.   He is S/P weekly carboplatin/paclitaxel with radiation therapy finishing in June 2016 in the curative setting.  Unfortunately, PET scan demonstrated a hypermetabolic superior mediastinal paraesophageal lesion, thereby precluding surgical intervention.  As a result, he went back on treatment with Xeloda + XRT.  He completed XRT on 12/12/2014.  He continued single-agent Xeloda 1500 mg BID 7 days on and 7 days off from 01/26/2015- 03/18/2015.  Restaging CT imaging on 03/14/2015 demonstrated progressive disease with hepatic lesion resulting in an upstaging his disease to a STAGE IV status.  Now on weekly Cisplatin/Irinotecan beginning on 03/27/2015 with the addition of Cyramza on 05/08/2015 every 2 weeks.  Oncology history is updated.  Treatment was held 2 weeks ago secondary to neutropenia and thrombocytopenia.  He was given 4 days worth of Neupogen.  Last week, his treatment was held due to thrombocytopenia.  TODAY, treatment will be deferred again for thrombocytopenia (improved compared to last week), and neutropenia.  I have ordered 4 days worth of Neupogen 480 mcg for neutropenia.  These orders are signed and held.  Treatment plan and antibody plan have been deferred accordingly.  We will D/C current fluids and switch to NS and run 0.5 L over 999 cc/hr to complete ~ 1 L of fluids today.  I will order CT CAP w contrast for restaging purposes.  If progression is identified, then we will need to discuss further options, including hospice.  Pre-treatment labs next week for possible treatment: CBC diff, CMET, and UA dipstick.  With the addition of Cyramza, TSH and urine dipstick will be checked every 2 weeks with Cyramza treatment.  His weight is up and his appetite is stable.  He will return next week for pre-treatment labs, review of CT scan results, possible treatment depending on labs and CT scan results.
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
## 2015-05-08 ENCOUNTER — Encounter (HOSPITAL_COMMUNITY): Payer: Self-pay | Admitting: Oncology

## 2015-05-08 ENCOUNTER — Ambulatory Visit (HOSPITAL_COMMUNITY): Payer: Medicaid Other | Admitting: Hematology & Oncology

## 2015-05-08 ENCOUNTER — Encounter (HOSPITAL_BASED_OUTPATIENT_CLINIC_OR_DEPARTMENT_OTHER): Payer: Medicaid Other

## 2015-05-08 ENCOUNTER — Ambulatory Visit (HOSPITAL_COMMUNITY): Payer: Medicaid Other | Admitting: Oncology

## 2015-05-08 ENCOUNTER — Encounter (HOSPITAL_BASED_OUTPATIENT_CLINIC_OR_DEPARTMENT_OTHER): Payer: Medicaid Other | Admitting: Oncology

## 2015-05-08 VITALS — BP 109/54 | HR 50 | Temp 97.9°F | Resp 20 | Wt 151.6 lb

## 2015-05-08 DIAGNOSIS — D696 Thrombocytopenia, unspecified: Secondary | ICD-10-CM

## 2015-05-08 DIAGNOSIS — D709 Neutropenia, unspecified: Secondary | ICD-10-CM

## 2015-05-08 DIAGNOSIS — C155 Malignant neoplasm of lower third of esophagus: Secondary | ICD-10-CM

## 2015-05-08 DIAGNOSIS — C159 Malignant neoplasm of esophagus, unspecified: Secondary | ICD-10-CM | POA: Diagnosis not present

## 2015-05-08 LAB — CBC WITH DIFFERENTIAL/PLATELET
BASOS PCT: 1 %
Basophils Absolute: 0 10*3/uL (ref 0.0–0.1)
Eosinophils Absolute: 0 10*3/uL (ref 0.0–0.7)
Eosinophils Relative: 2 %
HEMATOCRIT: 27.6 % — AB (ref 39.0–52.0)
HEMOGLOBIN: 9 g/dL — AB (ref 13.0–17.0)
Lymphocytes Relative: 36 %
Lymphs Abs: 0.5 10*3/uL — ABNORMAL LOW (ref 0.7–4.0)
MCH: 30 pg (ref 26.0–34.0)
MCHC: 32.6 g/dL (ref 30.0–36.0)
MCV: 92 fL (ref 78.0–100.0)
MONOS PCT: 21 %
Monocytes Absolute: 0.3 10*3/uL (ref 0.1–1.0)
NEUTROS PCT: 41 %
Neutro Abs: 0.6 10*3/uL — ABNORMAL LOW (ref 1.7–7.7)
Platelets: 70 10*3/uL — ABNORMAL LOW (ref 150–400)
RBC: 3 MIL/uL — ABNORMAL LOW (ref 4.22–5.81)
RDW: 16.9 % — ABNORMAL HIGH (ref 11.5–15.5)
WBC: 1.4 10*3/uL — AB (ref 4.0–10.5)

## 2015-05-08 LAB — COMPREHENSIVE METABOLIC PANEL
ALBUMIN: 3.6 g/dL (ref 3.5–5.0)
ALK PHOS: 83 U/L (ref 38–126)
ALT: 10 U/L — ABNORMAL LOW (ref 17–63)
ANION GAP: 6 (ref 5–15)
AST: 15 U/L (ref 15–41)
BILIRUBIN TOTAL: 0.6 mg/dL (ref 0.3–1.2)
BUN: 16 mg/dL (ref 6–20)
CALCIUM: 8.8 mg/dL — AB (ref 8.9–10.3)
CO2: 27 mmol/L (ref 22–32)
Chloride: 106 mmol/L (ref 101–111)
Creatinine, Ser: 0.81 mg/dL (ref 0.61–1.24)
GFR calc Af Amer: >60 mL/min (ref >60)
GFR calc non Af Amer: >60 mL/min (ref >60)
GLUCOSE: 122 mg/dL — AB (ref 65–99)
Potassium: 4.2 mmol/L (ref 3.5–5.1)
Sodium: 139 mmol/L (ref 135–145)
TOTAL PROTEIN: 6.7 g/dL (ref 6.5–8.1)

## 2015-05-08 LAB — MAGNESIUM: Magnesium: 1.7 mg/dL (ref 1.7–2.4)

## 2015-05-08 MED ORDER — SODIUM CHLORIDE 0.9 % IV SOLN
Freq: Once | INTRAVENOUS | Status: AC
  Administered 2015-05-08: 11:00:00 via INTRAVENOUS

## 2015-05-08 MED ORDER — POTASSIUM CHLORIDE 2 MEQ/ML IV SOLN
Freq: Once | INTRAVENOUS | Status: AC
  Administered 2015-05-08: 09:00:00 via INTRAVENOUS
  Filled 2015-05-08: qty 10

## 2015-05-08 MED ORDER — SODIUM CHLORIDE 0.9 % IV SOLN: Freq: Once | INTRAVENOUS | Status: DC

## 2015-05-08 MED ORDER — HEPARIN SOD (PORK) LOCK FLUSH 100 UNIT/ML IV SOLN
INTRAVENOUS | Status: AC
  Filled 2015-05-08: qty 5

## 2015-05-08 MED ORDER — FILGRASTIM 480 MCG/1.6ML IJ SOLN
480.0000 ug | Freq: Once | INTRAMUSCULAR | Status: AC
  Administered 2015-05-08: 480 ug via SUBCUTANEOUS
  Filled 2015-05-08: qty 1.6

## 2015-05-08 MED ORDER — HEPARIN SOD (PORK) LOCK FLUSH 100 UNIT/ML IV SOLN
500.0000 [IU] | Freq: Once | INTRAVENOUS | Status: AC
  Administered 2015-05-08: 500 [IU] via INTRAVENOUS

## 2015-05-08 NOTE — Progress Notes (Signed)
..  Anthony Cameron arrived today for chemo. Chemo was held due to blood counts. Fluids per orders and neupogen as ordered. Patient aware to return next week.

## 2015-05-08 NOTE — Patient Instructions (Signed)
Chemo held. See MD exam for discharge instructions

## 2015-05-09 ENCOUNTER — Encounter (HOSPITAL_BASED_OUTPATIENT_CLINIC_OR_DEPARTMENT_OTHER): Payer: Medicaid Other

## 2015-05-09 ENCOUNTER — Encounter (HOSPITAL_COMMUNITY): Payer: Self-pay

## 2015-05-09 VITALS — BP 110/65 | HR 45 | Resp 18

## 2015-05-09 DIAGNOSIS — C155 Malignant neoplasm of lower third of esophagus: Secondary | ICD-10-CM

## 2015-05-09 DIAGNOSIS — D709 Neutropenia, unspecified: Secondary | ICD-10-CM

## 2015-05-09 MED ORDER — FILGRASTIM 480 MCG/1.6ML IJ SOLN
480.0000 ug | Freq: Once | INTRAMUSCULAR | Status: AC
Start: 2015-05-09 — End: 2015-05-09
  Administered 2015-05-09: 480 ug via SUBCUTANEOUS
  Filled 2015-05-09: qty 1.6

## 2015-05-09 NOTE — Patient Instructions (Signed)
[place] at [facility] Discharge Instructions  RECOMMENDATIONS MADE BY THE CONSULTANT AND ANY TEST RESULTS WILL BE SENT TO YOUR REFERRING PHYSICIAN.  You received you Neupogen injection today. Follow up as scheduled  Thank you for choosing [place] at [facility] to provide your oncology and hematology care.  To afford each patient quality time with our provider, please arrive at least 15 minutes before your scheduled appointment time.   Beginning January 23rd 2017 lab work for the [facility] will be done in the  Main lab at [facility] on 1st floor. If you have a lab appointment with the [facility] please come in thru the  Main Entrance and check in at the main information desk  You need to re-schedule your appointment should you arrive 10 or more minutes late.  We strive to give you quality time with our providers, and arriving late affects you and other patients whose appointments are after yours.  Also, if you no show three or more times for appointments you may be dismissed from the clinic at the providers discretion.     Again, thank you for choosing [facility].  Our hope is that these requests will decrease the amount of time that you wait before being seen by our physicians.       _____________________________________________________________  Should you have questions after your visit to [facility], please contact our office at [phone] between the hours of 8:30 a.m. and 4:30 p.m.  Voicemails left after 4:30 p.m. will not be returned until the following business day.  For prescription refill requests, have your pharmacy contact our office.         Resources For Cancer Patients and their Caregivers ? American Cancer Society: Can assist with transportation, wigs, general needs, runs Look Good Feel Better.        985-453-6171 ? Cancer Care: Provides financial assistance, online support  groups, medication/co-pay assistance.  1-800-813-HOPE 564-698-9235) ? [place] Assists [place] cancer patients and their families through emotional , educational and financial support.  506-300-8192 ? [place] DSS Where to apply for food stamps, Medicaid and utility assistance. (434)488-4468 ? RCATS: Transportation to medical appointments. (907)536-4516 ? Social Security Administration: May apply for disability if have a Stage IV cancer. 952-375-3574 2150065635 ? [employer], Disability and Transit Services: Assists with nutrition, care and transit needs. 8011461103

## 2015-05-09 NOTE — Progress Notes (Signed)
Anthony Cameron presents today for injection per MD orders. Neupogen 455mg administered SQ in left Abdomen. Administration without incident. Patient tolerated well.

## 2015-05-12 ENCOUNTER — Telehealth (HOSPITAL_COMMUNITY): Payer: Self-pay | Admitting: *Deleted

## 2015-05-12 ENCOUNTER — Ambulatory Visit (HOSPITAL_COMMUNITY): Payer: Medicaid Other

## 2015-05-12 MED ORDER — FILGRASTIM 480 MCG/0.8ML IJ SOSY
480.0000 ug | PREFILLED_SYRINGE | Freq: Once | INTRAMUSCULAR | Status: AC
  Filled 2015-05-12: qty 0.8

## 2015-05-12 NOTE — Telephone Encounter (Signed)
Attempted to call patient in f/u to missed appointment for neupogen today. No answer on his phone or Priscilla's. No option to leave a message. Spoke with patient's relative at his house and she states that he just left but unsure where he was headed. I will wait 10-20 min to see if patient arrives for neupogen.

## 2015-05-13 ENCOUNTER — Ambulatory Visit (HOSPITAL_COMMUNITY)
Admission: RE | Admit: 2015-05-13 | Discharge: 2015-05-13 | Disposition: A | Discharge status: Home / Self Care | Payer: Medicaid Other | Source: Ambulatory Visit | Attending: Oncology | Admitting: Oncology

## 2015-05-13 ENCOUNTER — Encounter (HOSPITAL_BASED_OUTPATIENT_CLINIC_OR_DEPARTMENT_OTHER): Payer: Medicaid Other

## 2015-05-13 VITALS — BP 108/52 | HR 52 | Temp 97.9°F | Resp 18

## 2015-05-13 DIAGNOSIS — D709 Neutropenia, unspecified: Secondary | ICD-10-CM | POA: Diagnosis present

## 2015-05-13 DIAGNOSIS — I700 Atherosclerosis of aorta: Secondary | ICD-10-CM | POA: Insufficient documentation

## 2015-05-13 DIAGNOSIS — C155 Malignant neoplasm of lower third of esophagus: Secondary | ICD-10-CM

## 2015-05-13 DIAGNOSIS — K769 Liver disease, unspecified: Secondary | ICD-10-CM | POA: Insufficient documentation

## 2015-05-13 DIAGNOSIS — J439 Emphysema, unspecified: Secondary | ICD-10-CM | POA: Diagnosis not present

## 2015-05-13 DIAGNOSIS — R918 Other nonspecific abnormal finding of lung field: Secondary | ICD-10-CM | POA: Diagnosis not present

## 2015-05-13 DIAGNOSIS — I77810 Thoracic aortic ectasia: Secondary | ICD-10-CM | POA: Diagnosis not present

## 2015-05-13 DIAGNOSIS — I2510 Atherosclerotic heart disease of native coronary artery without angina pectoris: Secondary | ICD-10-CM | POA: Insufficient documentation

## 2015-05-13 MED ORDER — IOHEXOL 300 MG/ML  SOLN
100.0000 mL | Freq: Once | INTRAMUSCULAR | Status: AC | PRN
  Administered 2015-05-13: 100 mL via INTRAVENOUS

## 2015-05-13 MED ORDER — FILGRASTIM 480 MCG/1.6ML IJ SOLN
480.0000 ug | Freq: Once | INTRAMUSCULAR | Status: AC
  Administered 2015-05-13: 480 ug via SUBCUTANEOUS
  Filled 2015-05-13: qty 1.6

## 2015-05-13 NOTE — Progress Notes (Signed)
  Anthony Cameron presents today for injection per MD orders. Neupogen 446mg administered SQ in left Abdomen. Administration without incident. Patient tolerated well.

## 2015-05-13 NOTE — Telephone Encounter (Signed)
Any luck?  TK

## 2015-05-13 NOTE — Patient Instructions (Signed)
[place] at [facility] Discharge Instructions  RECOMMENDATIONS MADE BY THE CONSULTANT AND ANY TEST RESULTS WILL BE SENT TO YOUR REFERRING PHYSICIAN.  Neupogen today.    Thank you for choosing [place] at [facility] to provide your oncology and hematology care.  To afford each patient quality time with our provider, please arrive at least 15 minutes before your scheduled appointment time.   Beginning January 23rd 2017 lab work for the [facility] will be done in the  Main lab at [facility] on 1st floor. If you have a lab appointment with the [facility] please come in thru the  Main Entrance and check in at the main information desk  You need to re-schedule your appointment should you arrive 10 or more minutes late.  We strive to give you quality time with our providers, and arriving late affects you and other patients whose appointments are after yours.  Also, if you no show three or more times for appointments you may be dismissed from the clinic at the providers discretion.     Again, thank you for choosing [facility].  Our hope is that these requests will decrease the amount of time that you wait before being seen by our physicians.       _____________________________________________________________  Should you have questions after your visit to [facility], please contact our office at [phone] between the hours of 8:30 a.m. and 4:30 p.m.  Voicemails left after 4:30 p.m. will not be returned until the following business day.  For prescription refill requests, have your pharmacy contact our office.         Resources For Cancer Patients and their Caregivers ? American Cancer Society: Can assist with transportation, wigs, general needs, runs Look Good Feel Better.        714-855-0768 ? Cancer Care: Provides financial assistance, online support groups, medication/co-pay assistance.   1-800-813-HOPE 903-172-5550) ? [place] Assists [place] cancer patients and their families through emotional , educational and financial support.  515-675-3403 ? [place] DSS Where to apply for food stamps, Medicaid and utility assistance. (734)839-1180 ? RCATS: Transportation to medical appointments. 409-354-3504 ? Social Security Administration: May apply for disability if have a Stage IV cancer. [phone] ? [employer], Disability and Transit Services: Assists with nutrition, care and transit needs. 205-276-2225

## 2015-05-13 NOTE — Telephone Encounter (Signed)
Patient did not come for neupogen yesterday. I have called Anthony Cameron and she said that he is coming today.

## 2015-05-15 ENCOUNTER — Encounter (HOSPITAL_BASED_OUTPATIENT_CLINIC_OR_DEPARTMENT_OTHER): Payer: Medicaid Other | Admitting: Hematology & Oncology

## 2015-05-15 ENCOUNTER — Inpatient Hospital Stay (HOSPITAL_COMMUNITY): Payer: Medicaid Other

## 2015-05-15 ENCOUNTER — Encounter (HOSPITAL_COMMUNITY): Payer: Medicaid Other

## 2015-05-15 ENCOUNTER — Encounter (HOSPITAL_BASED_OUTPATIENT_CLINIC_OR_DEPARTMENT_OTHER): Payer: Medicaid Other

## 2015-05-15 ENCOUNTER — Ambulatory Visit (HOSPITAL_COMMUNITY): Payer: Medicaid Other | Admitting: Hematology & Oncology

## 2015-05-15 ENCOUNTER — Encounter (HOSPITAL_COMMUNITY): Payer: Self-pay | Admitting: Hematology & Oncology

## 2015-05-15 VITALS — BP 108/50 | HR 52 | Temp 98.5°F | Resp 16

## 2015-05-15 DIAGNOSIS — Z5112 Encounter for antineoplastic immunotherapy: Secondary | ICD-10-CM | POA: Diagnosis not present

## 2015-05-15 DIAGNOSIS — C155 Malignant neoplasm of lower third of esophagus: Secondary | ICD-10-CM | POA: Diagnosis present

## 2015-05-15 DIAGNOSIS — R918 Other nonspecific abnormal finding of lung field: Secondary | ICD-10-CM

## 2015-05-15 DIAGNOSIS — Z5111 Encounter for antineoplastic chemotherapy: Secondary | ICD-10-CM

## 2015-05-15 DIAGNOSIS — C159 Malignant neoplasm of esophagus, unspecified: Secondary | ICD-10-CM | POA: Diagnosis not present

## 2015-05-15 DIAGNOSIS — D649 Anemia, unspecified: Secondary | ICD-10-CM

## 2015-05-15 LAB — CBC WITH DIFFERENTIAL/PLATELET
BASOS PCT: 0 %
Basophils Absolute: 0 10*3/uL (ref 0.0–0.1)
Eosinophils Absolute: 0 10*3/uL (ref 0.0–0.7)
Eosinophils Relative: 0 %
HEMATOCRIT: 29.4 % — AB (ref 39.0–52.0)
HEMOGLOBIN: 9.2 g/dL — AB (ref 13.0–17.0)
LYMPHS ABS: 0.8 10*3/uL (ref 0.7–4.0)
Lymphocytes Relative: 7 %
MCH: 29.9 pg (ref 26.0–34.0)
MCHC: 31.3 g/dL (ref 30.0–36.0)
MCV: 95.5 fL (ref 78.0–100.0)
MONOS PCT: 12 %
Monocytes Absolute: 1.4 10*3/uL — ABNORMAL HIGH (ref 0.1–1.0)
NEUTROS ABS: 8.9 10*3/uL — AB (ref 1.7–7.7)
NEUTROS PCT: 80 %
Platelets: 146 10*3/uL — ABNORMAL LOW (ref 150–400)
RBC: 3.08 MIL/uL — AB (ref 4.22–5.81)
RDW: 18 % — ABNORMAL HIGH (ref 11.5–15.5)
WBC: 11.2 10*3/uL — AB (ref 4.0–10.5)

## 2015-05-15 LAB — COMPREHENSIVE METABOLIC PANEL
ALBUMIN: 3.7 g/dL (ref 3.5–5.0)
ALK PHOS: 101 U/L (ref 38–126)
ALT: 12 U/L — AB (ref 17–63)
ANION GAP: 7 (ref 5–15)
AST: 19 U/L (ref 15–41)
BILIRUBIN TOTAL: 0.3 mg/dL (ref 0.3–1.2)
BUN: 10 mg/dL (ref 6–20)
CALCIUM: 8.5 mg/dL — AB (ref 8.9–10.3)
CO2: 27 mmol/L (ref 22–32)
CREATININE: 0.95 mg/dL (ref 0.61–1.24)
Chloride: 106 mmol/L (ref 101–111)
GFR calc Af Amer: >60 mL/min (ref >60)
GFR calc non Af Amer: >60 mL/min (ref >60)
GLUCOSE: 184 mg/dL — AB (ref 65–99)
Potassium: 3.9 mmol/L (ref 3.5–5.1)
Sodium: 140 mmol/L (ref 135–145)
TOTAL PROTEIN: 6.7 g/dL (ref 6.5–8.1)

## 2015-05-15 LAB — URINALYSIS, DIPSTICK ONLY
BILIRUBIN URINE: NEGATIVE
Glucose, UA: NEGATIVE mg/dL
HGB URINE DIPSTICK: NEGATIVE
KETONES UR: NEGATIVE mg/dL
NITRITE: NEGATIVE
PROTEIN: NEGATIVE mg/dL
SPECIFIC GRAVITY, URINE: 1.015 (ref 1.005–1.030)
pH: 7 (ref 5.0–8.0)

## 2015-05-15 LAB — TSH: TSH: 0.369 u[IU]/mL (ref 0.350–4.500)

## 2015-05-15 LAB — MAGNESIUM: Magnesium: 1.7 mg/dL (ref 1.7–2.4)

## 2015-05-15 MED ORDER — SODIUM CHLORIDE 0.9 % IV SOLN
Freq: Once | INTRAVENOUS | Status: AC
Start: 2015-05-15 — End: 2015-05-15
  Administered 2015-05-15: 10:00:00 via INTRAVENOUS

## 2015-05-15 MED ORDER — SODIUM CHLORIDE 0.9 % IV SOLN
Freq: Once | INTRAVENOUS | Status: AC
  Administered 2015-05-15: 11:00:00 via INTRAVENOUS
  Filled 2015-05-15: qty 5

## 2015-05-15 MED ORDER — SODIUM CHLORIDE 0.9 % IV SOLN
8.0000 mg/kg | Freq: Once | INTRAVENOUS | Status: AC
  Administered 2015-05-15: 500 mg via INTRAVENOUS
  Filled 2015-05-15: qty 50

## 2015-05-15 MED ORDER — POTASSIUM CHLORIDE 2 MEQ/ML IV SOLN
Freq: Once | INTRAVENOUS | Status: AC
  Administered 2015-05-15: 09:00:00 via INTRAVENOUS
  Filled 2015-05-15: qty 10

## 2015-05-15 MED ORDER — PALONOSETRON HCL INJECTION 0.25 MG/5ML
0.2500 mg | Freq: Once | INTRAVENOUS | Status: AC
  Administered 2015-05-15: 0.25 mg via INTRAVENOUS
  Filled 2015-05-15: qty 5

## 2015-05-15 MED ORDER — HYDROCODONE-ACETAMINOPHEN 10-325 MG PO TABS: 1.0000 | ORAL_TABLET | ORAL | Status: DC | PRN

## 2015-05-15 MED ORDER — IRINOTECAN HCL CHEMO INJECTION 100 MG/5ML
65.0000 mg/m2 | Freq: Once | INTRAVENOUS | Status: AC
  Administered 2015-05-15: 118 mg via INTRAVENOUS
  Filled 2015-05-15: qty 5.9

## 2015-05-15 MED ORDER — SODIUM CHLORIDE 0.9 % IV SOLN
30.0000 mg/m2 | Freq: Once | INTRAVENOUS | Status: AC
  Administered 2015-05-15: 54 mg via INTRAVENOUS
  Filled 2015-05-15: qty 54

## 2015-05-15 MED ORDER — SODIUM CHLORIDE 0.9% FLUSH
10.0000 mL | INTRAVENOUS | Status: DC | PRN
  Administered 2015-05-15: 10 mL
  Filled 2015-05-15: qty 10

## 2015-05-15 MED ORDER — HEPARIN SOD (PORK) LOCK FLUSH 100 UNIT/ML IV SOLN
500.0000 [IU] | Freq: Once | INTRAVENOUS | Status: AC | PRN
  Administered 2015-05-15: 500 [IU]
  Filled 2015-05-15: qty 5

## 2015-05-15 MED ORDER — ACETAMINOPHEN 325 MG PO TABS
650.0000 mg | ORAL_TABLET | Freq: Once | ORAL | Status: AC
  Administered 2015-05-15: 650 mg via ORAL
  Filled 2015-05-15: qty 2

## 2015-05-15 MED ORDER — DIPHENHYDRAMINE HCL 50 MG/ML IJ SOLN
50.0000 mg | Freq: Once | INTRAMUSCULAR | Status: AC
  Administered 2015-05-15: 50 mg via INTRAVENOUS
  Filled 2015-05-15: qty 1

## 2015-05-15 NOTE — Patient Instructions (Addendum)
[place] at [facility] Discharge Instructions  RECOMMENDATIONS MADE BY THE CONSULTANT AND ANY TEST RESULTS WILL BE SENT TO YOUR REFERRING PHYSICIAN.   Exam and discussion by Dr [name] today Chemotherapy today if our counts are improved, counts are improved  Hydrocodone refill Return to see the doctor in 2 weeks  Please call the clinic if you have any questions or concerns    Thank you for choosing [place] at [facility] to provide your oncology and hematology care.  To afford each patient quality time with our provider, please arrive at least 15 minutes before your scheduled appointment time.   Beginning January 23rd 2017 lab work for the [facility] will be done in the  Main lab at [facility] on 1st floor. If you have a lab appointment with the [facility] please come in thru the  Main Entrance and check in at the main information desk  You need to re-schedule your appointment should you arrive 10 or more minutes late.  We strive to give you quality time with our providers, and arriving late affects you and other patients whose appointments are after yours.  Also, if you no show three or more times for appointments you may be dismissed from the clinic at the providers discretion.     Again, thank you for choosing [facility].  Our hope is that these requests will decrease the amount of time that you wait before being seen by our physicians.       _____________________________________________________________  Should you have questions after your visit to [facility], please contact our office at [phone] between the hours of 8:30 a.m. and 4:30 p.m.  Voicemails left after 4:30 p.m. will not be returned until the following business day.  For prescription refill requests, have your pharmacy contact our office.         Resources For Cancer Patients and their Caregivers ? American Cancer Society: Can  assist with transportation, wigs, general needs, runs Look Good Feel Better.        954-666-4975 ? Cancer Care: Provides financial assistance, online support groups, medication/co-pay assistance.  1-800-813-HOPE 313-695-2355) ? [place] Assists [place] cancer patients and their families through emotional , educational and financial support.  813-303-6090 ? [place] DSS Where to apply for food stamps, Medicaid and utility assistance. (463)052-7557 ? RCATS: Transportation to medical appointments. (201)159-2349 ? Social Security Administration: May apply for disability if have a Stage IV cancer. ([phone] 648-4066 (878) [phone] ? [employer], Disability and Transit Services: Assists with nutrition, care and transit needs. 951-429-1464

## 2015-05-15 NOTE — Progress Notes (Signed)
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 appearance. No new hepatic lesions are noted. No intra or extrahepatic biliary ductal dilatation. Gallbladder is normal in appearance. Pancreas: No pancreatic mass. No pancreatic ductal dilatation. No pancreatic or peripancreatic fluid or inflammatory changes. Spleen: Unremarkable. Adrenals/Urinary Tract: Sub cm low-attenuation lesions in the kidneys bilaterally are too small to definitively characterize, but are statistically favored to represent cysts. Bilateral adrenal glands are unremarkable in appearance. No hydroureteronephrosis in the visualized abdomen. Stomach/Bowel: The appearance of the  stomach is normal. No pathologic dilatation of small bowel or colon in the visualized portions of the abdomen. Vascular/Lymphatic: Extensive atherosclerosis in the abdominal vasculature, without evidence of aneurysm. No lymphadenopathy noted in the abdomen. Other: No significant volume of ascites and no pneumoperitoneum in the visualized portions of the peritoneal cavity. Musculoskeletal: There are no aggressive appearing lytic or blastic lesions noted  in the visualized portions of the skeleton. IMPRESSION: 1. The esophagus is grossly unremarkable in appearance. Specifically, no definite esophageal mass identified on today's examination. 2. Previously noted hypovascular lesion in segment 5 of the liver appears slightly smaller than the prior study, measuring 3.4 x 2.6 cm, suggesting positive response to therapy. No new liver lesions are noted. 3. Previously noted pulmonary nodules are stable compared to the recent prior examination. No new pulmonary nodules are noted. These remain suspicious. 4. Mild diffuse bronchial wall thickening with moderate centrilobular and mild paraseptal emphysema; imaging findings suggestive of underlying COPD. 5. There are calcifications of the aortic valve. Echocardiographic correlation for evaluation of potential valvular dysfunction may be warranted if clinically indicated. 6. Atherosclerosis, including left main and 3 vessel coronary artery disease. Please note that although the presence of coronary artery calcium documents the presence of coronary artery disease, the severity of this disease and any potential stenosis cannot be assessed on this non-gated CT examination. Assessment for potential risk factor modification, dietary therapy or pharmacologic therapy may be warranted, if clinically indicated. 7. 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 Recommend annual imaging followup by CTA or MRA. This recommendation follows 2010 ACCF/AHA/AATS/ACR/ASA/SCA/SCAI/SIR/STS/SVM Guidelines for the Diagnosis and Management of Patients with Thoracic Aortic Disease. Circulation. 2010; 121: T267-T245. Electronically Signed   By: Vinnie Langton M.D.   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 We are adding ramucirumab. I have changed his dosing to biweekly given his prior pancytopenia.   Imaging was reviewed in detail. He has had improvement in his disease. Weight is stable to improved.  He did not need any prescription refills. Pain is well controlled.   He will return in 2 weeks for repeat PE and ongoing therapy.    All questions were answered. The patient knows to call the clinic with any problems, questions or concerns. We can certainly see the patient much sooner if necessary.   This document serves as a record of services personally performed by Ancil Linsey, MD. It was created on her behalf by Kandace Blitz, a trained medical scribe. The creation of this record is based on the scribe's personal observations and the provider's statements to them. This document has been checked and approved by the attending provider.  I have reviewed the above documentation for accuracy and completeness, and I agree with the above.  This note was electronically signed.   Kelby Fam. Penland MD

## 2015-05-15 NOTE — Progress Notes (Signed)
Tolerated chemo well. Ambulatory on discharge home with sister.

## 2015-05-16 ENCOUNTER — Telehealth (HOSPITAL_COMMUNITY): Payer: Self-pay | Admitting: *Deleted

## 2015-05-16 NOTE — Telephone Encounter (Signed)
Called patient's home number, Earlie Server answered. Patient not at home. I asked if patient had a cell phone number I could call because I needed to ask him if he had any problems after taking the Lashae Wollenberg chemo yesterday. Dorothy said he seems to be doing just fine, he is gone away from the house for most of the day and he usually doesn't come back until 9 or 930 so he must be all right. Asked Earlie Server to relay message to patient if he was having any issues post chemo to please call the clinic and let us know.

## 2015-05-28 NOTE — Assessment & Plan Note (Addendum)
Initially, stage IIIA esophageal carcinoma, squamous cell, HER2 NEGATIVE.   He is S/P weekly carboplatin/paclitaxel with radiation therapy finishing in June 2016 in the curative setting.  Unfortunately, PET scan demonstrated a hypermetabolic superior mediastinal paraesophageal lesion, thereby precluding surgical intervention.  As a result, he went back on treatment with Xeloda + XRT.  He completed XRT on 12/12/2014.  He continued single-agent Xeloda 1500 mg BID 7 days on and 7 days off from 01/26/2015- 03/18/2015.  Restaging CT imaging on 03/14/2015 demonstrated progressive disease with hepatic lesion resulting in an upstaging his disease to a STAGE IV status.  Now on systemic chemotherapy consisting of Cisplatin/Irinotecan/Cyramza beginning on 03/27/2015.  Oncology history is updated.  Pre-treatment labs today: CBC diff, CMET, TSH, and UA dipstick.  Given his ANC of 1.0 today and being treated 2 weeks ago we will hold treatment today and defer it for 7 days. For his [place], we will initiate daily Neupogen 300 g (5 mcg/kg per day) beginning today followed by Monday, Tuesday, and Wednesday. Patient is educated on the risks, benefits, alternatives, and side effects of Neupogen therapy. He and his sister are educated on the role of this medication. Lannette Donath, his sister, is knowledgeable of the medication given that she was administered Neulasta with her chemotherapy regimen when she was treated for breast cancer.  Today, we spent a significant amount of our time discussing CODE STATUS. I reviewed each level of codes including full code, limited code, and DO NOT RESUSCITATE. Ivy was not an active participant in this conversation. The patient is given a MOST form to review and I have asked him to put some serious consideration into his desires and we will review this when he returns in one week. Lannette Donath understands. I'm not too sure Chalmers does but he is really educated on his incurability of cancer.  Return in  1 week for follow-up, pre-treatment labs, and treatment.

## 2015-05-28 NOTE — Progress Notes (Signed)
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 patient is given a MOST form to review and I have asked him to put some serious consideration into his desires and we will review this when he returns in one week. Lannette Donath understands. I'm not too sure [place] does but he is really educated on his incurability of cancer.  Return in 1 week for follow-up, pre-treatment labs, and treatment.  THERAPY PLAN:  Treatment today is deferred 7 days and we will administer Neupogen support to increase white blood cell count, namely ANC. We will try again next week for treatment. I broached the subject of CODE STATUS and we will review this again moving forward.  All questions were answered. The patient knows to call the clinic with any problems, questions or concerns. We can certainly see the patient much sooner if necessary.  Patient and plan discussed with Dr. Ancil Linsey and she is in agreement with the aforementioned.   This note is electronically signed by: Doy Mince 05/29/2015 6:09 PM

## 2015-05-29 ENCOUNTER — Encounter (HOSPITAL_BASED_OUTPATIENT_CLINIC_OR_DEPARTMENT_OTHER): Payer: Medicaid Other | Admitting: Oncology

## 2015-05-29 ENCOUNTER — Encounter (HOSPITAL_COMMUNITY): Payer: Medicaid Other | Attending: Hematology & Oncology

## 2015-05-29 ENCOUNTER — Encounter (HOSPITAL_COMMUNITY): Payer: Medicaid Other

## 2015-05-29 ENCOUNTER — Encounter (HOSPITAL_COMMUNITY): Payer: Self-pay | Admitting: Oncology

## 2015-05-29 VITALS — BP 133/68 | HR 71 | Temp 97.5°F | Resp 18 | Wt 166.2 lb

## 2015-05-29 DIAGNOSIS — D702 Other drug-induced agranulocytosis: Secondary | ICD-10-CM

## 2015-05-29 DIAGNOSIS — C155 Malignant neoplasm of lower third of esophagus: Secondary | ICD-10-CM

## 2015-05-29 DIAGNOSIS — C159 Malignant neoplasm of esophagus, unspecified: Secondary | ICD-10-CM | POA: Diagnosis not present

## 2015-05-29 DIAGNOSIS — H1013 Acute atopic conjunctivitis, bilateral: Secondary | ICD-10-CM

## 2015-05-29 DIAGNOSIS — Z95828 Presence of other vascular implants and grafts: Secondary | ICD-10-CM

## 2015-05-29 LAB — COMPREHENSIVE METABOLIC PANEL
ALT: 27 U/L (ref 17–63)
AST: 28 U/L (ref 15–41)
Albumin: 3.3 g/dL — ABNORMAL LOW (ref 3.5–5.0)
Alkaline Phosphatase: 89 U/L (ref 38–126)
Anion gap: 6 (ref 5–15)
BILIRUBIN TOTAL: 0.4 mg/dL (ref 0.3–1.2)
BUN: 17 mg/dL (ref 6–20)
CO2: 27 mmol/L (ref 22–32)
CREATININE: 1.07 mg/dL (ref 0.61–1.24)
Calcium: 8.2 mg/dL — ABNORMAL LOW (ref 8.9–10.3)
Chloride: 109 mmol/L (ref 101–111)
GFR calc Af Amer: >60 mL/min (ref >60)
Glucose, Bld: 142 mg/dL — ABNORMAL HIGH (ref 65–99)
Potassium: 4.4 mmol/L (ref 3.5–5.1)
Sodium: 142 mmol/L (ref 135–145)
TOTAL PROTEIN: 6 g/dL — AB (ref 6.5–8.1)

## 2015-05-29 LAB — URINALYSIS, DIPSTICK ONLY
BILIRUBIN URINE: NEGATIVE
GLUCOSE, UA: NEGATIVE mg/dL
KETONES UR: NEGATIVE mg/dL
Leukocytes, UA: NEGATIVE
Nitrite: NEGATIVE
PH: 6.5 (ref 5.0–8.0)
Protein, ur: NEGATIVE mg/dL
Specific Gravity, Urine: 1.015 (ref 1.005–1.030)

## 2015-05-29 LAB — CBC WITH DIFFERENTIAL/PLATELET
BASOS ABS: 0 10*3/uL (ref 0.0–0.1)
Basophils Relative: 1 %
Eosinophils Absolute: 0 10*3/uL (ref 0.0–0.7)
Eosinophils Relative: 1 %
HEMATOCRIT: 27.4 % — AB (ref 39.0–52.0)
Hemoglobin: 8.9 g/dL — ABNORMAL LOW (ref 13.0–17.0)
LYMPHS PCT: 26 %
Lymphs Abs: 0.5 10*3/uL — ABNORMAL LOW (ref 0.7–4.0)
MCH: 30.3 pg (ref 26.0–34.0)
MCHC: 32.5 g/dL (ref 30.0–36.0)
MCV: 93.2 fL (ref 78.0–100.0)
Monocytes Absolute: 0.4 10*3/uL (ref 0.1–1.0)
Monocytes Relative: 20 %
NEUTROS ABS: 1 10*3/uL — AB (ref 1.7–7.7)
Neutrophils Relative %: 52 %
Platelets: 131 10*3/uL — ABNORMAL LOW (ref 150–400)
RBC: 2.94 MIL/uL — AB (ref 4.22–5.81)
RDW: 19.1 % — ABNORMAL HIGH (ref 11.5–15.5)
WBC: 2 10*3/uL — AB (ref 4.0–10.5)

## 2015-05-29 LAB — MAGNESIUM: MAGNESIUM: 1.8 mg/dL (ref 1.7–2.4)

## 2015-05-29 LAB — TSH: TSH: 1.104 u[IU]/mL (ref 0.350–4.500)

## 2015-05-29 MED ORDER — POTASSIUM CHLORIDE 2 MEQ/ML IV SOLN
Freq: Once | INTRAVENOUS | Status: AC
  Administered 2015-05-29: 09:00:00 via INTRAVENOUS
  Filled 2015-05-29: qty 10

## 2015-05-29 MED ORDER — OLOPATADINE HCL 0.2 % OP SOLN: 1.0000 [drp] | Freq: Every day | OPHTHALMIC | Status: DC | PRN

## 2015-05-29 MED ORDER — FILGRASTIM 300 MCG/ML IJ SOLN
300.0000 ug | Freq: Once | INTRAMUSCULAR | Status: AC
  Administered 2015-05-29: 300 ug via SUBCUTANEOUS
  Filled 2015-05-29: qty 1

## 2015-05-29 MED ORDER — SODIUM CHLORIDE 0.9% FLUSH
10.0000 mL | INTRAVENOUS | Status: DC | PRN
  Administered 2015-05-29: 10 mL
  Filled 2015-05-29: qty 10

## 2015-05-29 MED ORDER — HEPARIN SOD (PORK) LOCK FLUSH 100 UNIT/ML IV SOLN: 500.0000 [IU] | Freq: Once | INTRAVENOUS | Status: DC | PRN

## 2015-05-29 MED ORDER — SODIUM CHLORIDE 0.9 % IV SOLN
Freq: Once | INTRAVENOUS | Status: AC
  Administered 2015-05-29: 09:00:00 via INTRAVENOUS

## 2015-05-29 MED ORDER — HEPARIN SOD (PORK) LOCK FLUSH 100 UNIT/ML IV SOLN
INTRAVENOUS | Status: AC
  Filled 2015-05-29: qty 5

## 2015-05-29 MED ORDER — HEPARIN SOD (PORK) LOCK FLUSH 100 UNIT/ML IV SOLN
500.0000 [IU] | Freq: Once | INTRAVENOUS | Status: AC
  Administered 2015-05-29: 500 [IU] via INTRAVENOUS

## 2015-05-29 NOTE — Patient Instructions (Signed)
[place] at [facility] Discharge Instructions  RECOMMENDATIONS MADE BY THE CONSULTANT AND ANY TEST RESULTS WILL BE SENT TO YOUR REFERRING PHYSICIAN.  Exam done and seen today by [name] Hold on Treatment today. Neupogen over the next four days. Treatment as scheduled if labs good. Will give you allergy eye drops today. Can take benadryl at bedtime for allergies also. Call if you have any questions or concerns. Follow up as scheduled. Discussed code status today, think about this and let us know what your wishes are. Given the "MOST" form today to go by to help you make this choices   Thank you for choosing [place] at [facility] to provide your oncology and hematology care.  To afford each patient quality time with our provider, please arrive at least 15 minutes before your scheduled appointment time.   Beginning January 23rd 2017 lab work for the [facility] will be done in the  Main lab at [facility] on 1st floor. If you have a lab appointment with the [place] please come in thru the  Main Entrance and check in at the main information desk  You need to re-schedule your appointment should you arrive 10 or more minutes late.  We strive to give you quality time with our providers, and arriving late affects you and other patients whose appointments are after yours.  Also, if you no show three or more times for appointments you may be dismissed from the clinic at the providers discretion.     Again, thank you for choosing [facility].  Our hope is that these requests will decrease the amount of time that you wait before being seen by our physicians.       _____________________________________________________________  Should you have questions after your visit to [facility], please contact our office at [phone] between the hours of 8:30 a.m. and 4:30 p.m.  Voicemails left after 4:30 p.m. will not  be returned until the following business day.  For prescription refill requests, have your pharmacy contact our office.         Resources For Cancer Patients and their Caregivers ? American Cancer Society: Can assist with transportation, wigs, general needs, runs Look Good Feel Better.        209-693-0041 ? Cancer Care: Provides financial assistance, online support groups, medication/co-pay assistance.  1-800-813-HOPE 573 353 7258) ? [place] Assists [place] cancer patients and their families through emotional , educational and financial support.  216-067-4300 ? [place] DSS Where to apply for food stamps, Medicaid and utility assistance. 740 500 6240 ? RCATS: Transportation to medical appointments. 9515506820 ? Social Security Administration: May apply for disability if have a Stage IV cancer. [phone] ? [employer], Disability and Transit Services: Assists with nutrition, care and transit needs. [phone]

## 2015-05-29 NOTE — Progress Notes (Signed)
Chemo deferred x 1 week, treatment parameters not met.  Anthony Cameron presents today for injection per MD orders. Neupogen 300 mcg administered SQ in left Abdomen. Administration without incident. Patient tolerated well.

## 2015-06-02 ENCOUNTER — Encounter (HOSPITAL_BASED_OUTPATIENT_CLINIC_OR_DEPARTMENT_OTHER): Payer: Medicaid Other

## 2015-06-02 ENCOUNTER — Encounter (HOSPITAL_COMMUNITY): Payer: Self-pay

## 2015-06-02 VITALS — BP 134/64 | HR 56 | Temp 98.0°F | Resp 18

## 2015-06-02 DIAGNOSIS — D702 Other drug-induced agranulocytosis: Secondary | ICD-10-CM | POA: Diagnosis present

## 2015-06-02 DIAGNOSIS — C155 Malignant neoplasm of lower third of esophagus: Secondary | ICD-10-CM

## 2015-06-02 MED ORDER — FILGRASTIM 300 MCG/ML IJ SOLN
300.0000 ug | Freq: Once | INTRAMUSCULAR | Status: DC
  Filled 2015-06-02: qty 1

## 2015-06-02 MED ORDER — FILGRASTIM 300 MCG/ML IJ SOLN
300.0000 ug | Freq: Once | INTRAMUSCULAR | Status: AC
Start: 2015-06-02 — End: 2015-06-02
  Administered 2015-06-02: 300 ug via SUBCUTANEOUS
  Filled 2015-06-02: qty 1

## 2015-06-02 NOTE — Progress Notes (Signed)
Anthony Cameron presents today for injection per the provider's orders.  neupogen administration without incident; see MAR for injection details.  Patient tolerated procedure well and without incident.  No questions or complaints noted at this time.

## 2015-06-03 ENCOUNTER — Other Ambulatory Visit (HOSPITAL_COMMUNITY): Payer: Self-pay | Admitting: Oncology

## 2015-06-03 ENCOUNTER — Encounter (HOSPITAL_BASED_OUTPATIENT_CLINIC_OR_DEPARTMENT_OTHER): Payer: Medicaid Other

## 2015-06-03 VITALS — BP 129/57 | HR 62 | Temp 99.0°F | Resp 18

## 2015-06-03 DIAGNOSIS — D702 Other drug-induced agranulocytosis: Secondary | ICD-10-CM | POA: Diagnosis not present

## 2015-06-03 DIAGNOSIS — C155 Malignant neoplasm of lower third of esophagus: Secondary | ICD-10-CM

## 2015-06-03 MED ORDER — FILGRASTIM 300 MCG/ML IJ SOLN
300.0000 ug | Freq: Once | INTRAMUSCULAR | Status: AC
  Administered 2015-06-03: 300 ug via SUBCUTANEOUS
  Filled 2015-06-03: qty 1

## 2015-06-03 MED ORDER — HYDROCODONE-ACETAMINOPHEN 10-325 MG PO TABS: 1.0000 | ORAL_TABLET | ORAL | Status: DC | PRN

## 2015-06-03 NOTE — Progress Notes (Signed)
Anthony Cameron presents today for injection per MD orders. Neupogen 358mg administered SQ in right Abdomen. Administration without incident. Patient tolerated well.  Temperature orally 99.020fpatient asymptomatic, MD aware.  Patient instructed to check his temperature at least twice a day and any time he feels any symptoms.  He was given a thermometer during visit today for at home use.  Also instructed to visit the ER if his temperature reached 100.64f264f greater.  He verbalized understanding.

## 2015-06-03 NOTE — Patient Instructions (Signed)
[place] at [facility] Discharge Instructions  RECOMMENDATIONS MADE BY THE CONSULTANT AND ANY TEST RESULTS WILL BE SENT TO YOUR REFERRING PHYSICIAN.  Neupogen injection today.   If you have a fever of 100.47for greater, please go to the ER for your safety.  The chemo that we give you decreases the ability of your immune system to fight infection and this could be dangerous if not checked.    [facility] provide your onc[facility] and hemat[facility] care.  To afford each patient quality time with our provider, please arrive at least 15 minutes before your scheduled appointment time.   Beginning January 23rd 2017 [facility] work [facility]. If you have a lab appointment with the [place] come in thru the  Main Entrance and check in at the main information desk  You need to re-schedule your appointment should you arrive 10 or more minutes late.  We strive to give you quality time with our providers, and arriving late affects you and other patients whose appointments are after yours.  Also, if you no show three or more times for appointments you may be dismissed from the clinic at the providers discretion.     Again, [facility]  Our hope is that these requests will decrease the amount of time that you wait before being seen by our physicians.       _____________________________________________________________  Should you have questions after your [facility] to [facility] please contact our office at [phone] between the hours of 8:30 a.m. and 4:30 p.m.  Voicemails left after 4:30 p.m. will not be returned until the following business day.  For prescription refill requests, have your pharmacy contact our office.         Resources For Cancer Patients and their Caregivers ? American Cancer  Society: Can assist with transportation, wigs, general needs, runs Look Good Feel Better.        1419-111-3817? Cancer Care: Provides financial assistance, online support groups, medication/co-pay assistance.  1-800-813-HOPE (253-234-6608 ? [place] cancer patients and their families through emotional , educational and financial support.  3(618)241-4324? [place] DSS Where to apply for food stamps, Medicaid and utility assistance. 3(530)799-2029? RCATS: Transportation to medical appointments. 3479 087 4855? Social Security Administration: May apply for disability if have a Stage IV cancer. [phone]) [phone]? [employer] Disability and Transit Services: Assists with nutrition, care and transit needs. [phone]

## 2015-06-04 ENCOUNTER — Encounter (HOSPITAL_BASED_OUTPATIENT_CLINIC_OR_DEPARTMENT_OTHER): Payer: Medicaid Other

## 2015-06-04 ENCOUNTER — Encounter (HOSPITAL_BASED_OUTPATIENT_CLINIC_OR_DEPARTMENT_OTHER): Payer: Medicaid Other | Admitting: Oncology

## 2015-06-04 VITALS — BP 141/66 | HR 56 | Temp 97.9°F | Resp 16

## 2015-06-04 DIAGNOSIS — D702 Other drug-induced agranulocytosis: Secondary | ICD-10-CM

## 2015-06-04 DIAGNOSIS — C159 Malignant neoplasm of esophagus, unspecified: Secondary | ICD-10-CM | POA: Diagnosis not present

## 2015-06-04 DIAGNOSIS — R600 Localized edema: Secondary | ICD-10-CM

## 2015-06-04 DIAGNOSIS — C155 Malignant neoplasm of lower third of esophagus: Secondary | ICD-10-CM

## 2015-06-04 DIAGNOSIS — R799 Abnormal finding of blood chemistry, unspecified: Secondary | ICD-10-CM | POA: Diagnosis not present

## 2015-06-04 LAB — COMPREHENSIVE METABOLIC PANEL
ALBUMIN: 3.4 g/dL — AB (ref 3.5–5.0)
ALT: 27 U/L (ref 17–63)
ANION GAP: 7 (ref 5–15)
AST: 30 U/L (ref 15–41)
Alkaline Phosphatase: 147 U/L — ABNORMAL HIGH (ref 38–126)
BILIRUBIN TOTAL: 0.3 mg/dL (ref 0.3–1.2)
BUN: 17 mg/dL (ref 6–20)
CHLORIDE: 109 mmol/L (ref 101–111)
CO2: 26 mmol/L (ref 22–32)
Calcium: 8.6 mg/dL — ABNORMAL LOW (ref 8.9–10.3)
Creatinine, Ser: 1.05 mg/dL (ref 0.61–1.24)
GFR calc Af Amer: >60 mL/min (ref >60)
GFR calc non Af Amer: >60 mL/min (ref >60)
GLUCOSE: 73 mg/dL (ref 65–99)
POTASSIUM: 4.2 mmol/L (ref 3.5–5.1)
SODIUM: 142 mmol/L (ref 135–145)
TOTAL PROTEIN: 6.3 g/dL — AB (ref 6.5–8.1)

## 2015-06-04 LAB — CBC WITH DIFFERENTIAL/PLATELET
BASOS PCT: 0 %
Basophils Absolute: 0 10*3/uL (ref 0.0–0.1)
EOS ABS: 0.2 10*3/uL (ref 0.0–0.7)
EOS PCT: 2 %
HEMATOCRIT: 29.7 % — AB (ref 39.0–52.0)
Hemoglobin: 9.5 g/dL — ABNORMAL LOW (ref 13.0–17.0)
LYMPHS PCT: 13 %
Lymphs Abs: 1.6 10*3/uL (ref 0.7–4.0)
MCH: 30.7 pg (ref 26.0–34.0)
MCHC: 32 g/dL (ref 30.0–36.0)
MCV: 96.1 fL (ref 78.0–100.0)
MONO ABS: 1.3 10*3/uL — AB (ref 0.1–1.0)
Monocytes Relative: 11 %
NEUTROS PCT: 74 %
Neutro Abs: 8.9 10*3/uL — ABNORMAL HIGH (ref 1.7–7.7)
Platelets: 126 10*3/uL — ABNORMAL LOW (ref 150–400)
RBC: 3.09 MIL/uL — AB (ref 4.22–5.81)
RDW: 20.3 % — AB (ref 11.5–15.5)
WBC: 12 10*3/uL — ABNORMAL HIGH (ref 4.0–10.5)

## 2015-06-04 LAB — MAGNESIUM: Magnesium: 1.9 mg/dL (ref 1.7–2.4)

## 2015-06-04 MED ORDER — FILGRASTIM 300 MCG/ML IJ SOLN
300.0000 ug | Freq: Once | INTRAMUSCULAR | Status: AC
  Administered 2015-06-04: 300 ug via SUBCUTANEOUS
  Filled 2015-06-04: qty 1

## 2015-06-04 NOTE — Progress Notes (Signed)
Rohin is seen as a work in today. He presents today for Neupogen injection.  He reports facial swelling. He denies any symptoms. He notes that a friend of his mentioned it and when he looked in the near, he agreed that he did have facial swelling. Today in the clinic he may have some superior orbital swelling, but otherwise unimpressive. He denies any chest pains or shortness of breath. He denies any difficulty with swallowing or tongue swelling. He denies any headaches, dysphasia, and cough. He denies any upper extremity swelling or neck swelling. His Port-A-Cath is on the right side of thorax. He denies any pruritus. He denies any rashes. He confirms that he wouldn't even know his face is swollen had it not been for his friend who mention it.  Gen.: Alert and oriented 3. Pleasant. Smiling. No acute distress. HEENT: Atraumatic and, normocephalic. Neck: No JVD noted on exam. No adenopathy. Oropharynx: No thrush. Uvula midline. Tongue midline. No abnormalities noted intraorally. Face: Mild superior orbital edema with trace edema on bilateral aspects of maxilla. No erythema. Chest: Right-sided Port-A-Cath. No varicosities. No distention of chest vasculature. Extremities: No upper extremity edema. Ring on left finger is loose. Neuro: Alert and oriented 3. No focal deficits.  Assessment: #1 superior orbital edema. Possible facial edema. Etiology unknown.  Plan: 1. I reviewed the scans with radiology (Dr. Thornton Papas). He recently had CT of chest imaging on 05/13/2015. Dr. 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 He denies any indication of SVC syndrome on imaging from March 2017. 2. Based on physical exam findings and radiographic M imaging from 3 weeks ago, SVC syndrome is extremely unlikely at this point time. Again, reviewed the case with radiology. I would not pursue repeat imaging at this point in time. 3. Patient  is due for treatment tomorrow. We'll perform pretreatment laboratory work today in preparation for treatment tomorrow. 4. Neupogen administered today. 5. I have recommended Benadryl at home for the patient. He can take 25- 50 mg every 6 hours when necessary. 6. Chase from the lab called me at around 5:45 PM today to review the patient's laboratory work. He reports that the patient's blood has been reviewed microscopically, and he has suspicion of some findings (blasts?). He would like approval to send this off her pathologist smear review. I've given that approval of course. I think the patient's peripheral smear abnormalities are secondary to daily Neupogen administration (4/13, 4/17, 4/18, 06/04/2015). I will discuss these concerns with Dr. [name].  Patient and plan discussed with Dr. Ancil Linsey and she is in agreement with the aforementioned.   Robynn Pane, PA-C 06/04/2015 5:58 PM

## 2015-06-04 NOTE — Progress Notes (Signed)
Anthony Cameron presents today for injection per MD orders. Neupogen 300 mcg administered SQ in left Abdomen. Administration without incident. Patient tolerated well.  Anthony Cameron presented for [facility]. Labs per MD order drawn via Peripheral Line 23 gauge needle inserted in right antecubital.  Good blood return present. Procedure without incident.  Needle removed intact. Patient tolerated procedure well.

## 2015-06-05 ENCOUNTER — Ambulatory Visit (HOSPITAL_COMMUNITY)
Admission: RE | Admit: 2015-06-05 | Discharge: 2015-06-05 | Disposition: A | Discharge status: Home / Self Care | Payer: Medicaid Other | Source: Ambulatory Visit | Attending: Oncology | Admitting: Oncology

## 2015-06-05 ENCOUNTER — Encounter (HOSPITAL_BASED_OUTPATIENT_CLINIC_OR_DEPARTMENT_OTHER): Payer: Medicaid Other | Admitting: Oncology

## 2015-06-05 ENCOUNTER — Ambulatory Visit (HOSPITAL_COMMUNITY): Payer: Medicaid Other | Admitting: Oncology

## 2015-06-05 ENCOUNTER — Encounter (HOSPITAL_BASED_OUTPATIENT_CLINIC_OR_DEPARTMENT_OTHER): Payer: Medicaid Other

## 2015-06-05 ENCOUNTER — Other Ambulatory Visit (HOSPITAL_COMMUNITY): Payer: Self-pay | Admitting: Oncology

## 2015-06-05 VITALS — BP 141/73 | HR 70 | Temp 98.7°F | Resp 18

## 2015-06-05 DIAGNOSIS — C7800 Secondary malignant neoplasm of unspecified lung: Secondary | ICD-10-CM

## 2015-06-05 DIAGNOSIS — Z8501 Personal history of malignant neoplasm of esophagus: Secondary | ICD-10-CM | POA: Diagnosis not present

## 2015-06-05 DIAGNOSIS — J90 Pleural effusion, not elsewhere classified: Secondary | ICD-10-CM

## 2015-06-05 DIAGNOSIS — C155 Malignant neoplasm of lower third of esophagus: Secondary | ICD-10-CM | POA: Diagnosis not present

## 2015-06-05 DIAGNOSIS — R0600 Dyspnea, unspecified: Secondary | ICD-10-CM

## 2015-06-05 DIAGNOSIS — R0602 Shortness of breath: Secondary | ICD-10-CM | POA: Insufficient documentation

## 2015-06-05 DIAGNOSIS — Z9889 Other specified postprocedural states: Secondary | ICD-10-CM

## 2015-06-05 LAB — PATHOLOGIST SMEAR REVIEW

## 2015-06-05 LAB — BODY FLUID CELL COUNT WITH DIFFERENTIAL
Eos, Fluid: 1 %
LYMPHS FL: 1 %
Monocyte-Macrophage-Serous Fluid: 65 % (ref 50–90)
NEUTROPHIL FLUID: 33 % — AB (ref 0–25)
WBC FLUID: 535 uL (ref 0–1000)

## 2015-06-05 LAB — GRAM STAIN: Gram Stain: NONE SEEN

## 2015-06-05 LAB — PROTEIN, BODY FLUID: Total protein, fluid: <3 g/dL

## 2015-06-05 LAB — GLUCOSE, SEROUS FLUID: Glucose, Fluid: 97 mg/dL

## 2015-06-05 LAB — LACTATE DEHYDROGENASE: LDH: 54 U/L — AB (ref 98–192)

## 2015-06-05 MED ORDER — ATROPINE SULFATE 1 MG/ML IJ SOLN
INTRAMUSCULAR | Status: AC
  Filled 2015-06-05: qty 1

## 2015-06-05 MED ORDER — DIPHENHYDRAMINE HCL 50 MG/ML IJ SOLN
INTRAMUSCULAR | Status: AC
  Filled 2015-06-05: qty 1

## 2015-06-05 MED ORDER — ACETAMINOPHEN 325 MG PO TABS: 650.0000 mg | ORAL_TABLET | Freq: Once | ORAL | Status: DC

## 2015-06-05 MED ORDER — IOPAMIDOL (ISOVUE-370) INJECTION 76%
100.0000 mL | Freq: Once | INTRAVENOUS | Status: AC | PRN
  Administered 2015-06-05: 100 mL via INTRAVENOUS

## 2015-06-05 MED ORDER — PALONOSETRON HCL INJECTION 0.25 MG/5ML: 0.2500 mg | Freq: Once | INTRAVENOUS | Status: DC

## 2015-06-05 MED ORDER — ACETAMINOPHEN 325 MG PO TABS
ORAL_TABLET | ORAL | Status: AC
  Filled 2015-06-05: qty 2

## 2015-06-05 MED ORDER — SODIUM CHLORIDE 0.9 % IV SOLN
Freq: Once | INTRAVENOUS | Status: DC
  Filled 2015-06-05: qty 5

## 2015-06-05 MED ORDER — HEPARIN SOD (PORK) LOCK FLUSH 100 UNIT/ML IV SOLN
INTRAVENOUS | Status: AC
  Filled 2015-06-05: qty 5

## 2015-06-05 MED ORDER — ATROPINE SULFATE 1 MG/ML IJ SOLN: 0.5000 mg | Freq: Once | INTRAMUSCULAR | Status: DC | PRN

## 2015-06-05 MED ORDER — LEVOFLOXACIN 500 MG PO TABS: 500.0000 mg | ORAL_TABLET | Freq: Every day | ORAL | Status: DC

## 2015-06-05 MED ORDER — SODIUM CHLORIDE 0.9% FLUSH
10.0000 mL | INTRAVENOUS | Status: DC | PRN
  Administered 2015-06-05: 10 mL
  Filled 2015-06-05: qty 10

## 2015-06-05 MED ORDER — POTASSIUM CHLORIDE 2 MEQ/ML IV SOLN
Freq: Once | INTRAVENOUS | Status: AC
  Administered 2015-06-05: 10:00:00 via INTRAVENOUS
  Filled 2015-06-05: qty 10

## 2015-06-05 MED ORDER — PALONOSETRON HCL INJECTION 0.25 MG/5ML
INTRAVENOUS | Status: AC
  Filled 2015-06-05: qty 5

## 2015-06-05 MED ORDER — HEPARIN SOD (PORK) LOCK FLUSH 100 UNIT/ML IV SOLN
500.0000 [IU] | Freq: Once | INTRAVENOUS | Status: AC | PRN
  Administered 2015-06-05: 500 [IU]

## 2015-06-05 MED ORDER — DIPHENHYDRAMINE HCL 50 MG/ML IJ SOLN: 50.0000 mg | Freq: Once | INTRAMUSCULAR | Status: DC

## 2015-06-05 MED ORDER — SODIUM CHLORIDE 0.9 % IV SOLN
Freq: Once | INTRAVENOUS | Status: AC
  Administered 2015-06-05: 10:00:00 via INTRAVENOUS

## 2015-06-05 NOTE — Progress Notes (Signed)
Patient is here for chemotherapy.  His labs meet treatment parameters.  However, he reports sudden onset of SOB this AM.  As a result, I have ordered a STAT CT angio of chest to evaluate for PE.  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  I have discussed the case with Dr. Maryland Pink (Radiology) and he agrees to perform a thoracentesis.  I have placed the order and requested testing of the pleural fluid.    As a result of the patient's clinic findings, will defer chemotherapy x 1 day.  Today, he confirms that he wants to be a FULL CODE based upon his responses on the MOST form he gives me.  [facility] is [facility] in [facility] and Oncology History is updated.  Gen: A and O x 3.  NAD.  Sister at the bedside.  Sleeping in chemo-bed Skin: Warm and dry HEENT: Atraumatic, normocephalic Neck: Trachea midline Cardiac: RRR Lungs: CTA with decreased breath sounds B/L bibasilar. Neuro: No focal deficits.  Treatment plan is altered accordingly.  Patient and plan discussed with Dr. Ancil Linsey and she is in agreement with the aforementioned.   Robynn Pane, PA-C 06/05/2015 4:08 PM

## 2015-06-05 NOTE — Patient Instructions (Signed)
[place] at [facility] Discharge Instructions  RECOMMENDATIONS MADE BY THE CONSULTANT AND ANY TEST RESULTS WILL BE SENT TO YOUR REFERRING PHYSICIAN.  CT angiogram and thoracentesis today as scheduled/ordered. Return to clinic tomorrow for chemo.  Thank you for choosing [place] at [facility] to provide your oncology and hematology care.  To afford each patient quality time with our provider, please arrive at least 15 minutes before your scheduled appointment time.   Beginning January 23rd 2017 lab work for the [facility] will be done in the  Main lab at [facility] on 1st floor. If you have a lab appointment with the [place] please come in thru the  Main Entrance and check in at the main information desk  You need to re-schedule your appointment should you arrive 10 or more minutes late.  We strive to give you quality time with our providers, and arriving late affects you and other patients whose appointments are after yours.  Also, if you no show three or more times for appointments you may be dismissed from the clinic at the providers discretion.     Again, thank you for choosing [facility].  Our hope is that these requests will decrease the amount of time that you wait before being seen by our physicians.       _____________________________________________________________  Should you have questions after your visit to [facility], please contact our office at [phone] between the hours of 8:30 a.m. and 4:30 p.m.  Voicemails left after 4:30 p.m. will not be returned until the following business day.  For prescription refill requests, have your pharmacy contact our office.         Resources For Cancer Patients and their Caregivers ? American Cancer Society: Can assist with transportation, wigs, general needs, runs Look Good Feel Better.        ([phone] ? Cancer Care: Provides financial  assistance, online support groups, medication/co-pay assistance.  1-800-813-HOPE (919)726-4656) ? [place] Assists [place] cancer patients and their families through emotional , educational and financial support.  313-794-9433 ? [place] DSS Where to apply for food stamps, Medicaid and utility assistance. [phone] ? RCATS: Transportation to medical appointments. [phone] ? Social Security Administration: May apply for disability if have a Stage IV cancer. [phone] ? [employer], Disability and Transit Services: Assists with nutrition, care and transit needs. [phone]

## 2015-06-05 NOTE — Progress Notes (Signed)
Thoracentesis complete no signs of distress. 400 ml yellow colored pleural fluid removed.

## 2015-06-05 NOTE — Progress Notes (Signed)
Patient noted to be SOB and reports to MD that he has been for a few days and noted facial edema yesterday. Patient sent for CT angiogram.  Patient returned from [place]. OK per Dr.Penland to start hydration fluids but wait for CT results before ordering chemo.  Per T.Kefalas,PA, HOLD chemo today. Patient has pleural effusions and will be sent for thoracentesis ASAP. Will reschedule chemo for tomorrow.  Patient and sister verbalized understanding.

## 2015-06-06 ENCOUNTER — Encounter (HOSPITAL_BASED_OUTPATIENT_CLINIC_OR_DEPARTMENT_OTHER): Payer: Medicaid Other

## 2015-06-06 VITALS — BP 129/74 | HR 65 | Temp 98.5°F | Resp 16 | Wt 175.8 lb

## 2015-06-06 DIAGNOSIS — C7800 Secondary malignant neoplasm of unspecified lung: Secondary | ICD-10-CM | POA: Diagnosis not present

## 2015-06-06 DIAGNOSIS — Z5112 Encounter for antineoplastic immunotherapy: Secondary | ICD-10-CM

## 2015-06-06 DIAGNOSIS — C155 Malignant neoplasm of lower third of esophagus: Secondary | ICD-10-CM

## 2015-06-06 DIAGNOSIS — Z5111 Encounter for antineoplastic chemotherapy: Secondary | ICD-10-CM | POA: Diagnosis present

## 2015-06-06 MED ORDER — POTASSIUM CHLORIDE 2 MEQ/ML IV SOLN
Freq: Once | INTRAVENOUS | Status: AC
  Administered 2015-06-06: 09:00:00 via INTRAVENOUS
  Filled 2015-06-06: qty 10

## 2015-06-06 MED ORDER — ATROPINE SULFATE 1 MG/ML IJ SOLN
0.5000 mg | Freq: Once | INTRAMUSCULAR | Status: AC | PRN
  Administered 2015-06-06: 0.5 mg via INTRAVENOUS
  Filled 2015-06-06: qty 1

## 2015-06-06 MED ORDER — IRINOTECAN HCL CHEMO INJECTION 100 MG/5ML: 65.0000 mg/m2 | Freq: Once | INTRAVENOUS | Status: DC

## 2015-06-06 MED ORDER — SODIUM CHLORIDE 0.9 % IV SOLN
Freq: Once | INTRAVENOUS | Status: AC
  Administered 2015-06-06: 11:00:00 via INTRAVENOUS
  Filled 2015-06-06: qty 5

## 2015-06-06 MED ORDER — PALONOSETRON HCL INJECTION 0.25 MG/5ML
0.2500 mg | Freq: Once | INTRAVENOUS | Status: AC
  Administered 2015-06-06: 0.25 mg via INTRAVENOUS
  Filled 2015-06-06: qty 5

## 2015-06-06 MED ORDER — HEPARIN SOD (PORK) LOCK FLUSH 100 UNIT/ML IV SOLN
500.0000 [IU] | Freq: Once | INTRAVENOUS | Status: AC | PRN
  Administered 2015-06-06: 500 [IU]

## 2015-06-06 MED ORDER — CISPLATIN CHEMO INJECTION 100MG/100ML
30.0000 mg/m2 | Freq: Once | INTRAVENOUS | Status: AC
  Administered 2015-06-06: 57 mg via INTRAVENOUS
  Filled 2015-06-06: qty 57

## 2015-06-06 MED ORDER — SODIUM CHLORIDE 0.9% FLUSH
10.0000 mL | INTRAVENOUS | Status: DC | PRN
  Administered 2015-06-06: 10 mL
  Filled 2015-06-06: qty 10

## 2015-06-06 MED ORDER — IRINOTECAN HCL CHEMO INJECTION 100 MG/5ML
65.0000 mg/m2 | Freq: Once | INTRAVENOUS | Status: AC
  Administered 2015-06-06: 124 mg via INTRAVENOUS
  Filled 2015-06-06: qty 6.2

## 2015-06-06 MED ORDER — SODIUM CHLORIDE 0.9 % IV SOLN: 30.0000 mg/m2 | Freq: Once | INTRAVENOUS | Status: DC

## 2015-06-06 MED ORDER — DIPHENHYDRAMINE HCL 50 MG/ML IJ SOLN
INTRAMUSCULAR | Status: AC
  Filled 2015-06-06: qty 1

## 2015-06-06 MED ORDER — DIPHENHYDRAMINE HCL 50 MG/ML IJ SOLN
50.0000 mg | Freq: Once | INTRAMUSCULAR | Status: AC
  Administered 2015-06-06: 50 mg via INTRAVENOUS

## 2015-06-06 MED ORDER — SODIUM CHLORIDE 0.9 % IV SOLN
Freq: Once | INTRAVENOUS | Status: AC
  Administered 2015-06-06: 11:00:00 via INTRAVENOUS

## 2015-06-06 MED ORDER — SODIUM CHLORIDE 0.9 % IV SOLN
8.0000 mg/kg | Freq: Once | INTRAVENOUS | Status: AC
  Administered 2015-06-06: 600 mg via INTRAVENOUS
  Filled 2015-06-06: qty 50

## 2015-06-06 MED ORDER — ACETAMINOPHEN 325 MG PO TABS
650.0000 mg | ORAL_TABLET | Freq: Once | ORAL | Status: AC
  Administered 2015-06-06: 650 mg via ORAL

## 2015-06-06 MED ORDER — SODIUM CHLORIDE 0.9 % IV SOLN: 8.0000 mg/kg | Freq: Once | INTRAVENOUS | Status: DC

## 2015-06-06 MED ORDER — ACETAMINOPHEN 325 MG PO TABS
ORAL_TABLET | ORAL | Status: AC
  Filled 2015-06-06: qty 2

## 2015-06-06 MED ORDER — HEPARIN SOD (PORK) LOCK FLUSH 100 UNIT/ML IV SOLN
INTRAVENOUS | Status: AC
  Filled 2015-06-06: qty 5

## 2015-06-06 NOTE — Progress Notes (Signed)
Tolerated all chemo well. Ambulatory on discharge home to self.

## 2015-06-06 NOTE — Patient Instructions (Signed)
[facility] Discharge Instructions for Patients Receiving Chemotherapy   Beginning January 23rd 2017 lab work for the [facility] will be done in the  Main lab at [facility] on 1st floor. If you have a lab appointment with the [place] please come in thru the  Main Entrance and check in at the main information desk   Today you received the following chemotherapy agents Cyramza, Cisplatin and Irinotecan.  To help prevent nausea and vomiting after your treatment, we encourage you to take your nausea medication as instructed.   If you develop nausea and vomiting, or diarrhea that is not controlled by your medication, call the clinic.  The clinic phone number is [phone]. Office hours are Monday-Friday 8:30am-5:00pm.  BELOW ARE SYMPTOMS THAT SHOULD BE REPORTED IMMEDIATELY:  *FEVER GREATER THAN 101.0 F  *CHILLS WITH OR WITHOUT FEVER  NAUSEA AND VOMITING THAT IS NOT CONTROLLED WITH YOUR NAUSEA MEDICATION  *UNUSUAL SHORTNESS OF BREATH  *UNUSUAL BRUISING OR BLEEDING  TENDERNESS IN MOUTH AND THROAT WITH OR WITHOUT PRESENCE OF ULCERS  *URINARY PROBLEMS  *BOWEL PROBLEMS  UNUSUAL RASH Items with * indicate a potential emergency and should be followed up as soon as possible. If you have an emergency after office hours please contact your primary care physician or go to the nearest emergency department.  Please call the clinic during office hours if you have any questions or concerns.   You may also contact the Patient Navigator at [phone] should you have any questions or need assistance in obtaining follow up care.   Resources For Cancer Patients and their Caregivers ? American Cancer Society: Can assist with transportation, wigs, general needs, runs Look Good Feel Better.        660-142-2288 ? Cancer Care: Provides financial assistance, online support groups, medication/co-pay assistance.  1-800-813-HOPE 6158038224) ? [place] Assists [place] cancer patients and their families through emotional , educational and financial support.  701-811-8581 ? [place] DSS Where to apply for food stamps, Medicaid and utility assistance. 514-352-7191 ? RCATS: Transportation to medical appointments. 862-118-3526 ? Social Security Administration: May apply for disability if have a Stage IV cancer. [phone] ? [employer], Disability and Transit Services: Assists with nutrition, care and transit needs. [phone]

## 2015-06-09 ENCOUNTER — Encounter (HOSPITAL_COMMUNITY): Payer: Self-pay

## 2015-06-09 ENCOUNTER — Other Ambulatory Visit: Payer: Self-pay

## 2015-06-09 ENCOUNTER — Emergency Department (HOSPITAL_COMMUNITY): Payer: Medicaid Other

## 2015-06-09 ENCOUNTER — Encounter (HOSPITAL_COMMUNITY): Payer: Medicaid Other

## 2015-06-09 ENCOUNTER — Inpatient Hospital Stay (HOSPITAL_COMMUNITY)
Admission: EM | Admit: 2015-06-09 | Discharge: 2015-06-13 | DRG: 193 | Disposition: A | Discharge status: Home / Self Care | Payer: Medicaid Other | Attending: Internal Medicine | Admitting: Internal Medicine

## 2015-06-09 DIAGNOSIS — I10 Essential (primary) hypertension: Secondary | ICD-10-CM | POA: Diagnosis present

## 2015-06-09 DIAGNOSIS — F341 Dysthymic disorder: Secondary | ICD-10-CM | POA: Diagnosis present

## 2015-06-09 DIAGNOSIS — Z801 Family history of malignant neoplasm of trachea, bronchus and lung: Secondary | ICD-10-CM | POA: Diagnosis not present

## 2015-06-09 DIAGNOSIS — Z79891 Long term (current) use of opiate analgesic: Secondary | ICD-10-CM | POA: Diagnosis not present

## 2015-06-09 DIAGNOSIS — T451X5A Adverse effect of antineoplastic and immunosuppressive drugs, initial encounter: Secondary | ICD-10-CM | POA: Diagnosis present

## 2015-06-09 DIAGNOSIS — Z823 Family history of stroke: Secondary | ICD-10-CM | POA: Diagnosis not present

## 2015-06-09 DIAGNOSIS — G8929 Other chronic pain: Secondary | ICD-10-CM | POA: Diagnosis present

## 2015-06-09 DIAGNOSIS — Z79899 Other long term (current) drug therapy: Secondary | ICD-10-CM | POA: Diagnosis not present

## 2015-06-09 DIAGNOSIS — Y95 Nosocomial condition: Secondary | ICD-10-CM | POA: Diagnosis present

## 2015-06-09 DIAGNOSIS — J189 Pneumonia, unspecified organism: Secondary | ICD-10-CM | POA: Diagnosis present

## 2015-06-09 DIAGNOSIS — J9601 Acute respiratory failure with hypoxia: Secondary | ICD-10-CM | POA: Diagnosis present

## 2015-06-09 DIAGNOSIS — Z515 Encounter for palliative care: Secondary | ICD-10-CM | POA: Diagnosis not present

## 2015-06-09 DIAGNOSIS — Z7189 Other specified counseling: Secondary | ICD-10-CM | POA: Diagnosis not present

## 2015-06-09 DIAGNOSIS — N289 Disorder of kidney and ureter, unspecified: Secondary | ICD-10-CM

## 2015-06-09 DIAGNOSIS — C155 Malignant neoplasm of lower third of esophagus: Secondary | ICD-10-CM | POA: Diagnosis present

## 2015-06-09 DIAGNOSIS — Z87891 Personal history of nicotine dependence: Secondary | ICD-10-CM | POA: Diagnosis not present

## 2015-06-09 DIAGNOSIS — M545 Low back pain, unspecified: Secondary | ICD-10-CM | POA: Diagnosis present

## 2015-06-09 DIAGNOSIS — M7989 Other specified soft tissue disorders: Secondary | ICD-10-CM | POA: Diagnosis present

## 2015-06-09 DIAGNOSIS — I509 Heart failure, unspecified: Secondary | ICD-10-CM

## 2015-06-09 DIAGNOSIS — M109 Gout, unspecified: Secondary | ICD-10-CM | POA: Diagnosis present

## 2015-06-09 DIAGNOSIS — Z833 Family history of diabetes mellitus: Secondary | ICD-10-CM | POA: Diagnosis not present

## 2015-06-09 DIAGNOSIS — Z7982 Long term (current) use of aspirin: Secondary | ICD-10-CM

## 2015-06-09 DIAGNOSIS — C7801 Secondary malignant neoplasm of right lung: Secondary | ICD-10-CM | POA: Diagnosis present

## 2015-06-09 DIAGNOSIS — C159 Malignant neoplasm of esophagus, unspecified: Secondary | ICD-10-CM | POA: Diagnosis present

## 2015-06-09 DIAGNOSIS — J90 Pleural effusion, not elsewhere classified: Secondary | ICD-10-CM | POA: Diagnosis present

## 2015-06-09 DIAGNOSIS — R0602 Shortness of breath: Secondary | ICD-10-CM

## 2015-06-09 DIAGNOSIS — H1013 Acute atopic conjunctivitis, bilateral: Secondary | ICD-10-CM

## 2015-06-09 DIAGNOSIS — N179 Acute kidney failure, unspecified: Secondary | ICD-10-CM | POA: Diagnosis present

## 2015-06-09 DIAGNOSIS — C7802 Secondary malignant neoplasm of left lung: Secondary | ICD-10-CM | POA: Diagnosis present

## 2015-06-09 DIAGNOSIS — Z9221 Personal history of antineoplastic chemotherapy: Secondary | ICD-10-CM | POA: Diagnosis not present

## 2015-06-09 DIAGNOSIS — F418 Other specified anxiety disorders: Secondary | ICD-10-CM | POA: Diagnosis present

## 2015-06-09 DIAGNOSIS — D61810 Antineoplastic chemotherapy induced pancytopenia: Secondary | ICD-10-CM | POA: Diagnosis present

## 2015-06-09 LAB — HEPATIC FUNCTION PANEL
ALBUMIN: 3.1 g/dL — AB (ref 3.5–5.0)
ALT: 23 U/L (ref 17–63)
AST: 24 U/L (ref 15–41)
Alkaline Phosphatase: 121 U/L (ref 38–126)
BILIRUBIN INDIRECT: 0.6 mg/dL (ref 0.3–0.9)
Bilirubin, Direct: 0.1 mg/dL (ref 0.1–0.5)
TOTAL PROTEIN: 5.9 g/dL — AB (ref 6.5–8.1)
Total Bilirubin: 0.7 mg/dL (ref 0.3–1.2)

## 2015-06-09 LAB — CBC WITH DIFFERENTIAL/PLATELET
BASOS ABS: 0 10*3/uL (ref 0.0–0.1)
Basophils Relative: 0 %
EOS ABS: 0.3 10*3/uL (ref 0.0–0.7)
Eosinophils Relative: 6 %
HEMATOCRIT: 30.9 % — AB (ref 39.0–52.0)
Hemoglobin: 10.1 g/dL — ABNORMAL LOW (ref 13.0–17.0)
LYMPHS ABS: 0.7 10*3/uL (ref 0.7–4.0)
Lymphocytes Relative: 16 %
MCH: 30.7 pg (ref 26.0–34.0)
MCHC: 32.7 g/dL (ref 30.0–36.0)
MCV: 93.9 fL (ref 78.0–100.0)
MONO ABS: 0.4 10*3/uL (ref 0.1–1.0)
Monocytes Relative: 10 %
NEUTROS PCT: 68 %
Neutro Abs: 2.8 10*3/uL (ref 1.7–7.7)
PLATELETS: 82 10*3/uL — AB (ref 150–400)
RBC: 3.29 MIL/uL — AB (ref 4.22–5.81)
RDW: 19.3 % — AB (ref 11.5–15.5)
WBC: 4.2 10*3/uL (ref 4.0–10.5)

## 2015-06-09 LAB — BASIC METABOLIC PANEL
Anion gap: 6 (ref 5–15)
BUN: 28 mg/dL — ABNORMAL HIGH (ref 6–20)
CALCIUM: 8.3 mg/dL — AB (ref 8.9–10.3)
CO2: 25 mmol/L (ref 22–32)
CREATININE: 1.41 mg/dL — AB (ref 0.61–1.24)
Chloride: 108 mmol/L (ref 101–111)
GFR calc non Af Amer: 52 mL/min — ABNORMAL LOW (ref >60)
GFR, EST AFRICAN AMERICAN: 60 mL/min — AB (ref >60)
GLUCOSE: 92 mg/dL (ref 65–99)
Potassium: 4.5 mmol/L (ref 3.5–5.1)
Sodium: 139 mmol/L (ref 135–145)

## 2015-06-09 LAB — BRAIN NATRIURETIC PEPTIDE: B NATRIURETIC PEPTIDE 5: 650 pg/mL — AB (ref 0.0–100.0)

## 2015-06-09 LAB — TROPONIN I: Troponin I: 0.03 ng/mL (ref <0.031)

## 2015-06-09 MED ORDER — MORPHINE SULFATE ER 15 MG PO TBCR
15.0000 mg | EXTENDED_RELEASE_TABLET | Freq: Two times a day (BID) | ORAL | Status: DC
  Administered 2015-06-09 – 2015-06-13 (×8): 15 mg via ORAL
  Filled 2015-06-09 (×8): qty 1

## 2015-06-09 MED ORDER — PIPERACILLIN-TAZOBACTAM 3.375 G IVPB
3.3750 g | Freq: Three times a day (TID) | INTRAVENOUS | Status: DC
  Administered 2015-06-09 – 2015-06-13 (×11): 3.375 g via INTRAVENOUS
  Filled 2015-06-09 (×9): qty 50

## 2015-06-09 MED ORDER — SODIUM CHLORIDE 0.9% FLUSH
3.0000 mL | Freq: Two times a day (BID) | INTRAVENOUS | Status: DC
  Administered 2015-06-10 – 2015-06-13 (×5): 3 mL via INTRAVENOUS

## 2015-06-09 MED ORDER — METOPROLOL TARTRATE 25 MG PO TABS
12.5000 mg | ORAL_TABLET | ORAL | Status: DC
  Administered 2015-06-09: 12.5 mg via ORAL
  Filled 2015-06-09: qty 1

## 2015-06-09 MED ORDER — SODIUM CHLORIDE 0.9% FLUSH: 3.0000 mL | INTRAVENOUS | Status: DC | PRN

## 2015-06-09 MED ORDER — VANCOMYCIN HCL 10 G IV SOLR
1500.0000 mg | Freq: Once | INTRAVENOUS | Status: AC
  Administered 2015-06-09: 1500 mg via INTRAVENOUS
  Filled 2015-06-09: qty 1500

## 2015-06-09 MED ORDER — SODIUM CHLORIDE 0.9 % IV SOLN: 250.0000 mL | INTRAVENOUS | Status: DC | PRN

## 2015-06-09 MED ORDER — PIPERACILLIN-TAZOBACTAM 3.375 G IVPB 30 MIN
3.3750 g | Freq: Once | INTRAVENOUS | Status: AC
  Administered 2015-06-09: 3.375 g via INTRAVENOUS
  Filled 2015-06-09: qty 50

## 2015-06-09 MED ORDER — VANCOMYCIN HCL IN DEXTROSE 750-5 MG/150ML-% IV SOLN
750.0000 mg | Freq: Two times a day (BID) | INTRAVENOUS | Status: DC
  Administered 2015-06-10 – 2015-06-13 (×7): 750 mg via INTRAVENOUS
  Filled 2015-06-09 (×13): qty 150

## 2015-06-09 NOTE — Progress Notes (Addendum)
Pharmacy Antibiotic Note  Anthony Cameron is a 65 y.o. male admitted on 06/09/2015 with pneumonia.  Pharmacy has been consulted for Vancomycin and Zosyn dosing.  Plan: Zosyn 3.375 IV q8h, Extended dosing interval Vancomycin '1500mg'$  load then '750mg'$  IV every 12 hours.  Goal trough 15-20 mcg/mL.  Height: 6' (182.9 cm) Weight: 170 lb (77.111 kg) IBW/kg (Calculated) : 77.6  Temp (24hrs), Avg:99.1 F (37.3 C), Min:98.2 F (36.8 C), Max:100.3 F (37.9 C)   Recent Labs Lab 06/04/15 1435 06/09/15 1435  WBC 12.0* 4.2  CREATININE 1.05 1.41*    Estimated Creatinine Clearance: 58.5 mL/min (by C-G formula based on Cr of 1.41).    No Known Allergies  Antimicrobials this admission: Vancomycin 4/24 >> Zosyn 4.24 >>   Microbiology results: 06/05/15 Body fluid cx>> no growth 4/24 Blood cx>> 4/24 Sputum cx>>  Thank you for allowing pharmacy to be a part of this patient's care.  Isac Sarna, BS [place], [place] Clinical Pharmacist Pager [phone] 06/09/2015 6:17 PM
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
## 2015-06-09 NOTE — ED Notes (Signed)
Patient from [facility]. Patient has bilateral lower extremity edema and orbital edema. Patient states mild shortness of breath with exertion that is new to him. Patient denies any pain at this time.

## 2015-06-09 NOTE — Progress Notes (Signed)
Pt transported to ER via wheelchair due to patient not feeling good. Pt has BLE, facial puffiness and fluid in his lower eyes. Dr. [name] notified and she said to send him to the ER.
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
## 2015-06-10 ENCOUNTER — Inpatient Hospital Stay (HOSPITAL_COMMUNITY): Payer: Medicaid Other

## 2015-06-10 LAB — CULTURE, BODY FLUID W GRAM STAIN -BOTTLE

## 2015-06-10 LAB — CULTURE, BODY FLUID-BOTTLE: CULTURE: NO GROWTH

## 2015-06-10 LAB — STREP PNEUMONIAE URINARY ANTIGEN: STREP PNEUMO URINARY ANTIGEN: NEGATIVE

## 2015-06-10 MED ORDER — VANCOMYCIN HCL IN DEXTROSE 750-5 MG/150ML-% IV SOLN
INTRAVENOUS | Status: AC
  Filled 2015-06-10: qty 150

## 2015-06-10 MED ORDER — METOPROLOL TARTRATE 25 MG PO TABS
12.5000 mg | ORAL_TABLET | ORAL | Status: DC
  Administered 2015-06-10 – 2015-06-12 (×3): 12.5 mg via ORAL
  Filled 2015-06-10 (×3): qty 1

## 2015-06-10 NOTE — Care Management Note (Signed)
Case Management Note  Patient Details  Name: Anthony Cameron MRN: [id number] Date of Birth: 1950-11-21  Subjective/Objective:     Spoke with patient who is alert and oriented from home. Stated that they can afford medications and are able to make thier medical appointments without difficulty. No DME or O2 at home. No CM needs identified.               Action/Plan:Home with self care.   Expected Discharge Date:                  Expected Discharge Plan:  Home/Self Care  In-House Referral:     Discharge planning Services  CM Consult  Post Acute Care Choice:    Choice offered to:     DME Arranged:    DME Agency:     HH Arranged:    HH Agency:     Status of Service:  In process, will continue to follow  Medicare Important Message Given:    Date Medicare IM Given:    Medicare IM give by:    Date Additional Medicare IM Given:    Additional Medicare Important Message give by:     If discussed at [place] of Stay Meetings, dates discussed:    Additional Comments:  Alvie Heidelberg, RN 06/10/2015, 2:05 PM

## 2015-06-10 NOTE — Progress Notes (Signed)
PROGRESS NOTE    Anthony Cameron  UMP:[id number] DOB: [birth year] DOA: 06/09/2015 PCP: [name], MD     Brief Narrative:  Patient is a 65 year old man admitted to the hospital on 4/24 with complaints of shortness of breath. He has a history of esophageal cancer and is currently undergoing chemotherapy under the care of Dr. [name]. He recently had a thoracentesis for pleural effusion. He presents with a cough for several days and fevers and was hypoxic at 87% on room air and what has been diagnosed with a hospital-acquired pneumonia.   Assessment & Plan:   Principal Problem:   HCAP (healthcare-associated pneumonia) Active Problems:   ANXIETY DEPRESSION   Essential hypertension, benign   LOW BACK PAIN, CHRONIC   Esophageal cancer (HCC)   Renal insufficiency   Hospital-acquired pneumonia -Continue vancomycin and Zosyn.  -culture data remains negative. -Chest x-ray has concerns for loculated effusions. Will request a CT scan of the chest. Further care will be dictated by results of CT chest. May need cardiothoracic surgery consultation for consideration of VATS if concern for empyema based on CT chest.  Acute renal failure  -recheck renal function in a.m., likely due to prerenal azotemia.  Esophageal cancer -Outpatient follow-up with oncology.  DVT prophylaxis: SCDs Code Status: Full code Family Communication: Patient only Disposition Plan: To be determined based on clinical course  Consultants:   None  Procedures:   None  Antimicrobials:   Vancomycin  Zosyn    Subjective: Still feels short of breath, no chest pain  Objective: Filed Vitals:   06/09/15 2000 06/09/15 2048 06/10/15 0521 06/10/15 1329  BP: 148/75 157/69 132/67 152/69  Pulse: 58 57 63 54  Temp:  98.1 F (36.7 C) 98.4 F (36.9 C) 97.9 F (36.6 C)  TempSrc:  Oral Oral Oral  Resp: '20 20 20 20  '$ Height:  6' (1.829 m)    Weight:  78.699 kg (173 lb 8 oz)    SpO2: 93% 93% 96% 95%      Intake/Output Summary (Last 24 hours) at 06/10/15 1551 Last data filed at 06/10/15 1330  Gross per 24 hour  Intake    580 ml  Output   1200 ml  Net   -620 ml   Filed Weights   06/09/15 1412 06/09/15 2048  Weight: 77.111 kg (170 lb) 78.699 kg (173 lb 8 oz)    Examination:   General exam: Alert, awake, oriented x 3 Respiratory system: Decreased bilateral breath sounds Cardiovascular system:RRR. No murmurs, rubs, gallops. Gastrointestinal system: Abdomen is nondistended, soft and nontender. No organomegaly or masses felt. Normal bowel sounds heard. Central nervous system: Alert and oriented. No focal neurological deficits. Extremities: No C/C/E, +pedal pulses Skin: No rashes, lesions or ulcers Psychiatry: Judgement and insight appear normal. Mood & affect appropriate.     Data Reviewed: I have personally reviewed following labs and imaging studies  CBC:  Recent Labs Lab 06/04/15 1435 06/09/15 1435  WBC 12.0* 4.2  NEUTROABS 8.9* 2.8  HGB 9.5* 10.1*  HCT 29.7* 30.9*  MCV 96.1 93.9  PLT 126* 82*   Basic Metabolic Panel:  Recent Labs Lab 06/04/15 1435 06/09/15 1435  NA 142 139  K 4.2 4.5  CL 109 108  CO2 26 25  GLUCOSE 73 92  BUN 17 28*  CREATININE 1.05 1.41*  CALCIUM 8.6* 8.3*  MG 1.9  --    GFR: Estimated Creatinine Clearance: 58.9 mL/min (by C-G formula based on Cr of 1.41). Liver Function Tests:  Recent Labs Lab  06/04/15 1435 06/09/15 1435  AST 30 24  ALT 27 23  ALKPHOS 147* 121  BILITOT 0.3 0.7  PROT 6.3* 5.9*  ALBUMIN 3.4* 3.1*   No results for input(s): LIPASE, AMYLASE in the last 168 hours. No results for input(s): AMMONIA in the last 168 hours. Coagulation Profile: No results for input(s): INR, PROTIME in the last 168 hours. Cardiac Enzymes:  Recent Labs Lab 06/09/15 1435  TROPONINI 0.03   BNP (last 3 results) No results for input(s): PROBNP in the last 8760 hours. HbA1C: No results for input(s): HGBA1C in the last 72  hours. CBG: No results for input(s): GLUCAP in the last 168 hours. Lipid Profile: No results for input(s): CHOL, HDL, LDLCALC, TRIG, CHOLHDL, LDLDIRECT in the last 72 hours. Thyroid Function Tests: No results for input(s): TSH, T4TOTAL, FREET4, T3FREE, THYROIDAB in the last 72 hours. Anemia Panel: No results for input(s): VITAMINB12, FOLATE, FERRITIN, TIBC, IRON, RETICCTPCT in the last 72 hours. Urine analysis:    Component Value Date/Time   COLORURINE YELLOW 05/29/2015 0830   APPEARANCEUR CLEAR 05/29/2015 0830   LABSPEC 1.015 05/29/2015 0830   PHURINE 6.5 05/29/2015 0830   GLUCOSEU NEGATIVE 05/29/2015 0830   HGBUR TRACE* 05/29/2015 0830   HGBUR negative 06/28/2007 1004   BILIRUBINUR NEGATIVE 05/29/2015 0830   KETONESUR NEGATIVE 05/29/2015 0830   PROTEINUR NEGATIVE 05/29/2015 0830   UROBILINOGEN 1.0 06/28/2007 1004   NITRITE NEGATIVE 05/29/2015 0830   LEUKOCYTESUR NEGATIVE 05/29/2015 0830   Sepsis Labs: '@LABRCNTIP'$ (procalcitonin:4,lacticidven:4)  ) Recent Results (from the past 240 hour(s))  Gram stain     Status: None   Collection Time: 06/05/15 12:45 PM  Result Value Ref Range Status   Specimen Description THORACIC  Final   Special Requests NONE  Final   Gram Stain   Final    NO ORGANISMS SEEN CYTOSPIN SMEAR Performed at [facility]    Report Status 06/05/2015 FINAL  Final  Culture, body fluid-bottle     Status: None   Collection Time: 06/05/15 12:49 PM  Result Value Ref Range Status   Specimen Description THORACIC  Final   Special Requests BOTTLES DRAWN AEROBIC AND ANAEROBIC [place]  Final   Culture NO GROWTH 5 DAYS  Final   Report Status 06/10/2015 FINAL  Final  Culture, blood (routine x 2) Call MD if unable to obtain prior to antibiotics being given     Status: None (Preliminary result)   Collection Time: 06/09/15  8:58 PM  Result Value Ref Range Status   Specimen Description BLOOD LEFT ANTECUBITAL  Final   Special Requests BOTTLES DRAWN AEROBIC AND  ANAEROBIC 10CC EACH  Final   Culture NO GROWTH < 24 HOURS  Final   Report Status PENDING  Incomplete  Culture, blood (routine x 2) Call MD if unable to obtain prior to antibiotics being given     Status: None (Preliminary result)   Collection Time: 06/09/15  9:13 PM  Result Value Ref Range Status   Specimen Description BLOOD RIGHT HAND  Final   Special Requests   Final    BOTTLES DRAWN AEROBIC AND ANAEROBIC AEB=5CC ANA=2CC   Culture NO GROWTH < 24 HOURS  Final   Report Status PENDING  Incomplete         Radiology Studies: Dg Chest 2 View  06/09/2015  CLINICAL DATA:  Shortness of breath. Bilateral lower extremity edema. EXAM: CHEST  2 VIEW COMPARISON:  06/05/2015 FINDINGS: There is cardiomegaly. Moderate bilateral pleural effusions which appear partially loculated. These have  enlarged since prior study. Increasing bibasilar opacities. Appearance concerning for pneumonia. Right Port-A-Cath is unchanged. IMPRESSION: Worsening loculated bilateral pleural effusions and bilateral lower lobe airspace opacities concerning for pneumonia. Electronically Signed   By: Rolm Baptise M.D.   On: 06/09/2015 15:37   Ct Chest Wo Contrast  06/10/2015  CLINICAL DATA:  Bilateral pleural effusions EXAM: CT CHEST WITHOUT CONTRAST TECHNIQUE: Multidetector CT imaging of the chest was performed following the standard protocol without IV contrast. COMPARISON:  06/05/2015 FINDINGS: Large right-sided pleural effusion is again identified and shows some changes suggestive of loculation similar to that seen on the prior exam. No significant increase is noted. There is some improved aeration in the right lower lobe when compared with the prior study. Scattered calcified granulomas as well as nodular changes are noted within the right lung stable from the recent exam. The left hemi thorax also demonstrates persistent pleural effusion. The overall appearance of the fluid has increased slightly in the interval from the prior exam.  Changes again consistent with loculation are noted. Increase in the degree of left lower lobe consolidation is noted. The previously seen nodules are less well appreciated due to the consolidation. The thoracic aorta shows calcifications and is stable. The thoracic inlet is within normal limits. A right-sided chest wall port is seen. No definitive lymphadenopathy is identified at this time. Coronary calcifications are again seen. The visualized upper abdomen again demonstrates a hypodense lesion within the liver similar to that seen on prior exam from 05/13/2015. This is consistent with metastatic disease. The bony structures are within normal limits. IMPRESSION: Stable appearance of right-sided pleural effusion with improved aeration in the right lung when compared with the prior exam. In the crease seeing left-sided loculated pleural effusion with increased basilar consolidation when compared with the prior study. Pulmonary nodules somewhat obscured when compared with the prior exam but again consistent with metastatic disease. Hepatic metastatic disease. Electronically Signed   By: Inez Catalina M.D.   On: 06/10/2015 13:57        Scheduled Meds: . metoprolol tartrate  12.5 mg Oral 1 day or 1 dose  . morphine  15 mg Oral BID  . piperacillin-tazobactam (ZOSYN)  IV  3.375 g Intravenous Q8H  . sodium chloride flush  3 mL Intravenous Q12H  . vancomycin  750 mg Intravenous Q12H   Continuous Infusions:    LOS: 1 day    Time spent: 25 minutes. Greater than 50% of this time was spent in direct contact with the patient coordinating care.     Lelon Frohlich, MD [facility] Pager [phone]  If 7PM-7AM, please contact night-coverage [email or web address] Password [facility] 06/10/2015, 3:51 PM

## 2015-06-11 ENCOUNTER — Encounter (HOSPITAL_COMMUNITY): Payer: Self-pay | Admitting: Primary Care

## 2015-06-11 DIAGNOSIS — J90 Pleural effusion, not elsewhere classified: Secondary | ICD-10-CM | POA: Diagnosis present

## 2015-06-11 DIAGNOSIS — Z7189 Other specified counseling: Secondary | ICD-10-CM | POA: Diagnosis not present

## 2015-06-11 DIAGNOSIS — D61810 Antineoplastic chemotherapy induced pancytopenia: Secondary | ICD-10-CM

## 2015-06-11 DIAGNOSIS — Z515 Encounter for palliative care: Secondary | ICD-10-CM | POA: Diagnosis not present

## 2015-06-11 DIAGNOSIS — N179 Acute kidney failure, unspecified: Secondary | ICD-10-CM | POA: Diagnosis present

## 2015-06-11 DIAGNOSIS — J189 Pneumonia, unspecified organism: Principal | ICD-10-CM

## 2015-06-11 DIAGNOSIS — T451X5A Adverse effect of antineoplastic and immunosuppressive drugs, initial encounter: Secondary | ICD-10-CM

## 2015-06-11 DIAGNOSIS — C155 Malignant neoplasm of lower third of esophagus: Secondary | ICD-10-CM

## 2015-06-11 LAB — BASIC METABOLIC PANEL
ANION GAP: 6 (ref 5–15)
BUN: 21 mg/dL — ABNORMAL HIGH (ref 6–20)
CHLORIDE: 106 mmol/L (ref 101–111)
CO2: 25 mmol/L (ref 22–32)
Calcium: 8.2 mg/dL — ABNORMAL LOW (ref 8.9–10.3)
Creatinine, Ser: 1.3 mg/dL — ABNORMAL HIGH (ref 0.61–1.24)
GFR calc Af Amer: >60 mL/min (ref >60)
GFR, EST NON AFRICAN AMERICAN: 57 mL/min — AB (ref >60)
GLUCOSE: 94 mg/dL (ref 65–99)
POTASSIUM: 4.4 mmol/L (ref 3.5–5.1)
Sodium: 137 mmol/L (ref 135–145)

## 2015-06-11 LAB — CBC
HEMATOCRIT: 29.5 % — AB (ref 39.0–52.0)
HEMOGLOBIN: 9.7 g/dL — AB (ref 13.0–17.0)
MCH: 30.2 pg (ref 26.0–34.0)
MCHC: 32.9 g/dL (ref 30.0–36.0)
MCV: 91.9 fL (ref 78.0–100.0)
Platelets: 62 10*3/uL — ABNORMAL LOW (ref 150–400)
RBC: 3.21 MIL/uL — ABNORMAL LOW (ref 4.22–5.81)
RDW: 18.8 % — ABNORMAL HIGH (ref 11.5–15.5)
WBC: 3.3 10*3/uL — AB (ref 4.0–10.5)

## 2015-06-11 MED ORDER — SODIUM CHLORIDE 0.9 % IV SOLN: INTRAVENOUS | Status: DC

## 2015-06-11 MED ORDER — SENNOSIDES-DOCUSATE SODIUM 8.6-50 MG PO TABS
1.0000 | ORAL_TABLET | Freq: Every day | ORAL | Status: DC
Start: 2015-06-11 — End: 2015-06-13
  Administered 2015-06-11 – 2015-06-12 (×2): 1 via ORAL
  Filled 2015-06-11 (×2): qty 1

## 2015-06-11 NOTE — Clinical Documentation Improvement (Signed)
Internal Medicine  Can the diagnosis of Hypoxic at 87% in room air be further specified? Please document findings in next progress note. Thank you.   Hypoxic secondary to pleural effusion and HCAP  Respiratory Failure - if ruled in, please document:  Document Acuity - Acute, Chronic, Acute on Chronic  Document Inclusion Of - Hypoxia, Hypercapnia, Combination of Both  Other Condition  Clinically Undetermined  Document any associated diagnoses/conditions.  Supporting Information:  Heart rate mid 50's to low 60's, Respiratory Rate ranging from 18 to 22  Oxygen saturations at times in high 80's in room air  Placed in 2L FiO2 with improvement in saturations from mid to high 90's  Please exercise your independent, professional judgment when responding. A specific answer is not anticipated or expected.  Thank You,  Zoila Shutter RN, BSN, [place] [phone]; Cell: [phone]

## 2015-06-11 NOTE — Progress Notes (Signed)
PROGRESS NOTE    Anthony Cameron  WIO:[id number] DOB: November 05, 1950 DOA: 06/09/2015 PCP: [name], MD  Outpatient Specialists:    Brief Narrative:  Patient is a 65 year old man admitted to the hospital on 4/24 with complaints of shortness of breath. He has a history of esophageal cancer and is currently undergoing chemotherapy under the care of Dr. [name]. He recently had a thoracentesis for pleural effusion. He presents with a cough for several days and fevers and was hypoxic at 87% on room air and what has been diagnosed with a hospital-acquired pneumonia.   Assessment & Plan:   Principal Problem:   HCAP (healthcare-associated pneumonia) Active Problems:   ANXIETY DEPRESSION   Essential hypertension, benign   LOW BACK PAIN, CHRONIC   Esophageal cancer ([place])   Renal insufficiency   Palliative care encounter   DNR (do not resuscitate) discussion   1. Healthcare associated pneumonia. On admission, the patient was febrile with a temperature of 100.3. He was oxygenating 95% on room air. His white blood cell count was within normal limits at 4.2. His BNP was 650. His chest x-ray revealed worsening loculated bilateral pleural effusions and bilateral lower lobe airspace opacities, concerning for pneumonia. -Patient underwent further imaging with a CT of the chest without contrast and revealed stable right-sided pleural effusion with improved aeration in the right lung and increased left-sided loculated pleural effusion with consolidation. -Patient was started on vancomycin and Zosyn. -Appears to be improving clinically and symptomatically.  Bilateral pleural effusions with possible loculation. 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 Oncology ordered an outpatient CT angiogram of the chest to assess for PE on 06/05/15. He was negative for PE, but with pleural effusions. Right-sided thoracentesis was performed and yielded 400 cc. Per  review of the chart, the pleural fluid culture has remained negative. -Patient appears to be stable and does not appear to be in any shortness of breath, particularly when I entered the room. -We will consider thoracentesis again-on the left- if he decompensates.  Esophageal cancer. Patient is followed by Dr. [name]. He is being treated with chemotherapy.  -Patient is full code. Palliative care was consulted and consultation noted and appreciated. -Continue MS Contin for pain.  Pancytopenia: Patient's WBC was 4.2 on admission with a hemoglobin of 10.1 and a platelet count of 82. His pancytopenia is likely secondary to esophageal cancer with metastasis and chemotherapy. No signs of bleeding. -We'll continue to monitor his blood counts closely as they are trending downward, but not in arrange for transfusion.  Acute kidney injury. The patient's creatinine was 1.41 on admission, but it had been within normal limits at 1.05 on 06/05/15. Patient was given IV fluids in the ED. His creatinine has improved to 1.3, but not back to baseline. We'll restart gentle IV fluids and follow his renal function.  Hypertension. Currently stable on metoprolol.     DVT prophylaxis: SCDs Code Status: Full code Family Communication: Family not available; discussed with patient Disposition Plan: Discharged home in the next 24-48 hours.   Consultants:   None  Procedures:   None  Antimicrobials:   Zosyn  Vancomycin    Subjective: Patient denies shortness of breath at rest. He has a mild cough, nonproductive. He denies pleurisy.  Objective: Filed Vitals:   06/10/15 2008 06/10/15 2139 06/11/15 0634 06/11/15 1306  BP:  146/70 144/71 140/73  Pulse:  61 66 70  Temp:  98.5 F (36.9 C) 98.7 F (37.1 C) 98.5 F (36.9  C)  TempSrc:  Oral Oral Oral  Resp:  '20 20 20  '$ Height:      Weight:      SpO2: 93% 97% 93% 94%    Intake/Output Summary (Last 24 hours) at 06/11/15 1716 Last data filed at  06/11/15 1200  Gross per 24 hour  Intake    710 ml  Output    900 ml  Net   -190 ml   Filed Weights   06/09/15 1412 06/09/15 2048  Weight: 77.111 kg (170 lb) 78.699 kg (173 lb 8 oz)    Examination:  General exam: Appears calm and comfortable; no acute distress.  Respiratory system: Few fine crackles bilaterally with decreased breath sounds in the bases. Breathing surprisingly nonlabored at rest. Cardiovascular system: S1 & S2 with a soft systolic murmur. No pedal edema. Gastrointestinal system: Abdomen is nondistended, soft and nontender. No organomegaly or masses felt. Normal bowel sounds heard. Central nervous system: Alert and oriented. No focal neurological deficits. Extremities: Symmetric 5 x 5 power. Skin: No rashes, lesions or ulcers Psychiatry: Judgement and insight appear normal. Mood & affect appropriate.     Data Reviewed: I have personally reviewed following labs and imaging studies  CBC:  Recent Labs Lab 06/09/15 1435 06/11/15 0504  WBC 4.2 3.3*  NEUTROABS 2.8  --   HGB 10.1* 9.7*  HCT 30.9* 29.5*  MCV 93.9 91.9  PLT 82* 62*   Basic Metabolic Panel:  Recent Labs Lab 06/09/15 1435 06/11/15 0504  NA 139 137  K 4.5 4.4  CL 108 106  CO2 25 25  GLUCOSE 92 94  BUN 28* 21*  CREATININE 1.41* 1.30*  CALCIUM 8.3* 8.2*   GFR: Estimated Creatinine Clearance: 63.8 mL/min (by C-G formula based on Cr of 1.3). Liver Function Tests:  Recent Labs Lab 06/09/15 1435  AST 24  ALT 23  ALKPHOS 121  BILITOT 0.7  PROT 5.9*  ALBUMIN 3.1*   No results for input(s): LIPASE, AMYLASE in the last 168 hours. No results for input(s): AMMONIA in the last 168 hours. Coagulation Profile: No results for input(s): INR, PROTIME in the last 168 hours. Cardiac Enzymes:  Recent Labs Lab 06/09/15 1435  TROPONINI 0.03   BNP (last 3 results) No results for input(s): PROBNP in the last 8760 hours. HbA1C: No results for input(s): HGBA1C in the last 72 hours. CBG: No  results for input(s): GLUCAP in the last 168 hours. Lipid Profile: No results for input(s): CHOL, HDL, LDLCALC, TRIG, CHOLHDL, LDLDIRECT in the last 72 hours. Thyroid Function Tests: No results for input(s): TSH, T4TOTAL, FREET4, T3FREE, THYROIDAB in the last 72 hours. Anemia Panel: No results for input(s): VITAMINB12, FOLATE, FERRITIN, TIBC, IRON, RETICCTPCT in the last 72 hours. Urine analysis:    Component Value Date/Time   COLORURINE YELLOW 05/29/2015 0830   APPEARANCEUR CLEAR 05/29/2015 0830   LABSPEC 1.015 05/29/2015 0830   PHURINE 6.5 05/29/2015 0830   GLUCOSEU NEGATIVE 05/29/2015 0830   HGBUR TRACE* 05/29/2015 0830   HGBUR negative 06/28/2007 1004   BILIRUBINUR NEGATIVE 05/29/2015 0830   KETONESUR NEGATIVE 05/29/2015 0830   PROTEINUR NEGATIVE 05/29/2015 0830   UROBILINOGEN 1.0 06/28/2007 1004   NITRITE NEGATIVE 05/29/2015 0830   LEUKOCYTESUR NEGATIVE 05/29/2015 0830   Sepsis Labs: '@LABRCNTIP'$ (procalcitonin:4,lacticidven:4)  ) Recent Results (from the past 240 hour(s))  Gram stain     Status: None   Collection Time: 06/05/15 12:45 PM  Result Value Ref Range Status   Specimen Description THORACIC  Final   Special Requests  NONE  Final   Gram Stain   Final    NO ORGANISMS SEEN CYTOSPIN SMEAR Performed at [facility]    Report Status 06/05/2015 FINAL  Final  Culture, body fluid-bottle     Status: None   Collection Time: 06/05/15 12:49 PM  Result Value Ref Range Status   Specimen Description THORACIC  Final   Special Requests BOTTLES DRAWN AEROBIC AND ANAEROBIC [place]  Final   Culture NO GROWTH 5 DAYS  Final   Report Status 06/10/2015 FINAL  Final  Culture, blood (routine x 2) Call MD if unable to obtain prior to antibiotics being given     Status: None (Preliminary result)   Collection Time: 06/09/15  8:58 PM  Result Value Ref Range Status   Specimen Description BLOOD LEFT ANTECUBITAL  Final   Special Requests BOTTLES DRAWN AEROBIC AND ANAEROBIC 10CC  EACH  Final   Culture NO GROWTH 2 DAYS  Final   Report Status PENDING  Incomplete  Culture, blood (routine x 2) Call MD if unable to obtain prior to antibiotics being given     Status: None (Preliminary result)   Collection Time: 06/09/15  9:13 PM  Result Value Ref Range Status   Specimen Description BLOOD RIGHT HAND  Final   Special Requests   Final    BOTTLES DRAWN AEROBIC AND ANAEROBIC AEB=5CC ANA=2CC   Culture NO GROWTH 2 DAYS  Final   Report Status PENDING  Incomplete         Radiology Studies: Ct Chest Wo Contrast  06/10/2015  CLINICAL DATA:  Bilateral pleural effusions EXAM: CT CHEST WITHOUT CONTRAST TECHNIQUE: Multidetector CT imaging of the chest was performed following the standard protocol without IV contrast. COMPARISON:  06/05/2015 FINDINGS: Large right-sided pleural effusion is again identified and shows some changes suggestive of loculation similar to that seen on the prior exam. No significant increase is noted. There is some improved aeration in the right lower lobe when compared with the prior study. Scattered calcified granulomas as well as nodular changes are noted within the right lung stable from the recent exam. The left hemi thorax also demonstrates persistent pleural effusion. The overall appearance of the fluid has increased slightly in the interval from the prior exam. Changes again consistent with loculation are noted. Increase in the degree of left lower lobe consolidation is noted. The previously seen nodules are less well appreciated due to the consolidation. The thoracic aorta shows calcifications and is stable. The thoracic inlet is within normal limits. A right-sided chest wall port is seen. No definitive lymphadenopathy is identified at this time. Coronary calcifications are again seen. The visualized upper abdomen again demonstrates a hypodense lesion within the liver similar to that seen on prior exam from 05/13/2015. This is consistent with metastatic disease.  The bony structures are within normal limits. IMPRESSION: Stable appearance of right-sided pleural effusion with improved aeration in the right lung when compared with the prior exam. In the crease seeing left-sided loculated pleural effusion with increased basilar consolidation when compared with the prior study. Pulmonary nodules somewhat obscured when compared with the prior exam but again consistent with metastatic disease. Hepatic metastatic disease. Electronically Signed   By: Inez Catalina M.D.   On: 06/10/2015 13:57        Scheduled Meds: . metoprolol tartrate  12.5 mg Oral Q24H  . morphine  15 mg Oral BID  . piperacillin-tazobactam (ZOSYN)  IV  3.375 g Intravenous Q8H  . sodium chloride flush  3  mL Intravenous Q12H  . vancomycin  750 mg Intravenous Q12H   Continuous Infusions:    LOS: 2 days    Time spent: 51 minutes    Rexene Alberts, MD [facility] Pager [phone]  If 7PM-7AM, please contact night-coverage [email or web address] Password [facility] 06/11/2015, 5:16 PM

## 2015-06-11 NOTE — Clinical Documentation Improvement (Signed)
Internal Medicine  CHF mentioned in ED notes. Please clarify and document in next progress note if:   CHF ruled out  CHF ruled in - if ruled in please document:   Acuity - Acute, Chronic, Acute on Chronic   Type - Systolic, Diastolic, Systolic and Diastolic  Other  Clinically Undetermined  Document any associated diagnoses/conditions  Supporting Information:  BNP was 650  Being treated with PO Lopressor 12.5 mg daily  No ECHO found in chart  Please exercise your independent, professional judgment when responding. A specific answer is not anticipated or expected.  Thank You,  Zoila Shutter RN, BSN, [place] [phone]; Cell: [phone]

## 2015-06-11 NOTE — Consult Note (Signed)
Consultation Note Date: 06/11/2015   Patient Name: Anthony Cameron  DOB: 1950/02/25  MRN: [id number]  Age / Sex: 65 y.o., male  PCP: [name], MD Referring Physician: Rexene Alberts, MD  Reason for Consultation: Establishing goals of care and Psychosocial/spiritual support  HPI/Patient Profile: 65 y.o. male  with past medical history of esophageal cancer on chemotherapy admitted on 06/09/2015 with shortness of breath.   Clinical Assessment and Goals of Care: Anthony Cameron is lying in bed watching television and no apparent distress, he makes eye contact and greets me as I enter. We talk about his home life, he lives with his aunt Anthony Cameron. He tells me that he is able to complete his ADLs independently. We talk about his cancer treatment plan, and he shares that he has 2 more treatments. We talk about symptom management such as pain and anxiety, he denies issues at this time. I share that Dr. [name] can provide symptom management if he needs. We talk about his insurance coverage, he shares that he has Medicaid not Medicare. We discussed the social worker in the cancer center being available on Tuesdays.   Contacts/Participants in Discussion: Anthony Cameron today Primary Decision Maker/Relationship: Anthony Cameron able to make his own health care decisions   HCPOA: unsure, he states that he would like for his sister Anthony Cameron to be his healthcare power of attorney. He shares that he has no wife or children. Just a baby brother. He shares that his sister and brother would be an agreement about his care.  SUMMARY OF RECOMMENDATIONS   Anthony Cameron shares that his goal is to continue with cancer treatments. We talk about code status, and he's desire is to remain of full code. He states that he would take a peg tube for feeding if needed. He states "do everything". Code Status/Advance Care  Planning:    Code Status Orders        Start     Ordered   06/09/15 2053  Full code   Continuous     06/09/15 2052    Code Status History    Date Active Date Inactive Code Status Order ID Comments User Context   06/05/2015 11:12 AM 06/09/2015  8:52 PM Full Code [id number] 06/05/2015 [facility], PA-C Outpatient   06/27/2014 10:55 AM 06/28/2014  3:47 AM Full Code [id number]  Marybelle Killings, MD HOV       Other Directives:None  Symptom Management:   per hospitalist  Palliative Prophylaxis:   Aspiration, Frequent Pain Assessment and Turn Reposition  Additional Recommendations (Limitations, Scope, Preferences):  Treat the treatable at this time, continue with current chemotherapy plan. Anthony Cameron is open to continued thoracentesis as needed.  Psycho-social/Spiritual:   Desire for further Chaplaincy support:no  Additional Recommendations: Caregiving  Support/Resources, Education on Hospice and Grief/Bereavement Support  Prognosis:   Unable to determine  Discharge Planning: Home with Home Health      Primary Diagnoses: Present on Admission:  . HCAP (healthcare-associated pneumonia) . ANXIETY DEPRESSION . Essential hypertension, benign .  LOW BACK PAIN, CHRONIC . Esophageal cancer ([place])  I have reviewed the medical record, interviewed the patient and family, and examined the patient. The following aspects are pertinent.  Past Medical History  Diagnosis Date  . Gout   . Arthritis   . Anemia   . Esophageal cancer ([place]) 05/2014    diagnosed  . Abnormal PET scan, lung     hx. esophageal cancer, being evaluated   Social History   Social History  . Marital Status: Single    Spouse Name: N/A  . Number of Children: 0  . Years of Education: N/A   Social History Main Topics  . Smoking status: Former Smoker    Quit date: 05/27/1999  . Smokeless tobacco: Never Used  . Alcohol Use: No     Comment: remote in past, 2001  . Drug Use: No     Comment: quit 2001,  crack cocaine, marijuana  . Sexual Activity: Not Asked   Other Topics Concern  . None   Social History Narrative   Family History  Problem Relation Age of Onset  . Colon cancer Neg Hx   . Diabetes Other     aunt  . Cancer Brother     lung cancer  . Cancer Mother   . Stroke Father    Scheduled Meds: . metoprolol tartrate  12.5 mg Oral Q24H  . morphine  15 mg Oral BID  . piperacillin-tazobactam (ZOSYN)  IV  3.375 g Intravenous Q8H  . sodium chloride flush  3 mL Intravenous Q12H  . vancomycin  750 mg Intravenous Q12H   Continuous Infusions:  PRN Meds:.sodium chloride, sodium chloride flush Medications Prior to Admission:  Prior to Admission medications   Medication Sig Start Date End Date Taking? Authorizing Provider  aspirin EC 81 MG tablet Take 1 tablet (81 mg total) by mouth daily. 10/10/14  Yes Herminio Commons, MD  CISPLATIN IV Inject into the vein. Weekly starting 03/27/15   Yes Historical Provider, MD  esomeprazole (NEXIUM 24HR) 20 MG capsule Take 1 capsule (20 mg total) by mouth 2 (two) times daily before a meal. 05/27/14  Yes Mahala Menghini, PA-C  HYDROcodone-acetaminophen (NORCO) 10-325 MG tablet Take 1 tablet by mouth every 4 (four) hours as needed. Patient taking differently: Take 1 tablet by mouth every 4 (four) hours as needed for moderate pain or severe pain.  06/03/15  Yes Manon Hilding Kefalas, PA-C  IRINOTECAN HCL IV Inject into the vein. Starting weekly 03/27/15   Yes Historical Provider, MD  levofloxacin (LEVAQUIN) 500 MG tablet Take 1 tablet (500 mg total) by mouth daily. 06/05/15 06/19/15 Yes Manon Hilding Kefalas, PA-C  lidocaine-prilocaine (EMLA) cream Apply a quarter size amount to port site 1 hour prior to chemo. Do not rub in. Cover with plastic wrap. 06/21/14  Yes [name], MD  metoprolol tartrate (LOPRESSOR) 25 MG tablet Take 0.5 tablets (12.5 mg total) by mouth 1 day or 1 dose. 12/05/14  Yes Grace Isaac, MD  Misc Natural Products (OSTEO BI-FLEX ADV JOINT  SHIELD PO) Take 1 tablet by mouth daily.    Yes Historical Provider, MD  morphine (MS CONTIN) 15 MG 12 hr tablet Take 1 tablet (15 mg total) by mouth 2 (two) times daily. 04/24/15  Yes Manon Hilding Kefalas, PA-C  Olopatadine HCl (PATADAY) 0.2 % SOLN Apply 1 drop to eye daily as needed. Patient taking differently: Apply 1 drop to eye daily as needed (for allergy eye relief).  05/29/15  Yes Marcello Moores  S Kefalas, PA-C  ondansetron (ZOFRAN) 8 MG tablet Take 1 tablet (8 mg total) by mouth every 8 (eight) hours as needed for nausea or vomiting. 03/03/15  Yes [name], MD  prochlorperazine (COMPAZINE) 10 MG tablet Take 1 tablet (10 mg total) by mouth every 6 (six) hours as needed for nausea or vomiting. 06/21/14  Yes [name], MD  Ramucirumab (CYRAMZA IV) Inject into the vein. Every 14 days   Yes Historical Provider, MD  simvastatin (ZOCOR) 20 MG tablet Take 1 tablet (20 mg total) by mouth daily. 10/10/14  Yes Herminio Commons, MD   No Known Allergies Review of Systems  Constitutional: Negative for activity change and appetite change.  Respiratory: Positive for cough and shortness of breath. Negative for apnea.   Cardiovascular: Negative for chest pain.  Gastrointestinal: Negative for abdominal distention.  All other systems reviewed and are negative.   Physical Exam  Constitutional: He is oriented to person, place, and time. No distress.  Cardiovascular: Normal rate and regular rhythm.   Pulmonary/Chest: Effort normal. No respiratory distress.  Abdominal: Soft. He exhibits no distension.  Neurological: He is alert and oriented to person, place, and time.  Skin: Skin is warm and dry.  Psychiatric: He has a normal mood and affect.  Nursing note and vitals reviewed.   Vital Signs: BP 140/73 mmHg  Pulse 70  Temp(Src) 98.5 F (36.9 C) (Oral)  Resp 20  Ht 6' (1.829 m)  Wt 78.699 kg (173 lb 8 oz)  BMI 23.53 kg/m2  SpO2 94% Pain Assessment: No/denies pain   Pain Score: 0-No  pain   SpO2: SpO2: 94 % O2 Device:SpO2: 94 % O2 Flow Rate: .O2 Flow Rate (L/min): 1.5 L/min  IO: Intake/output summary:  Intake/Output Summary (Last 24 hours) at 06/11/15 1549 Last data filed at 06/11/15 1200  Gross per 24 hour  Intake    710 ml  Output    900 ml  Net   -190 ml    LBM: Last BM Date: 06/09/15 Baseline Weight: Weight: 77.111 kg (170 lb) Most recent weight: Weight: 78.699 kg (173 lb 8 oz)     Palliative Assessment/Data:   Flowsheet Rows        Most Recent Value   Intake Tab    Referral Department  Hospitalist   Unit at Time of Referral  Med/Surg Unit   Palliative Care Primary Diagnosis  Pulmonary   Date Notified  06/10/15   Palliative Care Type  New Palliative care   Reason for referral  Advance Care Planning   Date of Admission  06/09/15   Date first seen by Palliative Care  06/11/15   # of days Palliative referral response time  1 Day(s)   # of days IP prior to Palliative referral  1   Clinical Assessment    Palliative Performance Scale Score  60%   Pain Max last 24 hours  2   Pain Min Last 24 hours  1   Dyspnea Max Last 24 Hours  4   Dyspnea Min Last 24 hours  1   Psychosocial & Spiritual Assessment    Palliative Care Outcomes    Patient/Family meeting held?  Yes   Who was at the meeting?  Meeting with patient only today   Palliative Care Outcomes  Clarified goals of care, Provided psychosocial or spiritual support   Palliative Care follow-up planned  -- [Follow-up at [facility]]      Time In: 1410 Time Out: 1440 Time Total:  30 Minutes Greater than 50%  of this time was spent counseling and coordinating care related to the above assessment and plan.  Signed by: Drue Novel, NP   Please contact Palliative Medicine Team phone at [phone] for questions and concerns.

## 2015-06-12 ENCOUNTER — Inpatient Hospital Stay (HOSPITAL_COMMUNITY): Payer: Medicaid Other

## 2015-06-12 DIAGNOSIS — J90 Pleural effusion, not elsewhere classified: Secondary | ICD-10-CM

## 2015-06-12 LAB — CBC WITH DIFFERENTIAL/PLATELET
BASOS ABS: 0 10*3/uL (ref 0.0–0.1)
Basophils Relative: 1 %
Eosinophils Absolute: 0.2 10*3/uL (ref 0.0–0.7)
Eosinophils Relative: 6 %
HEMATOCRIT: 29.2 % — AB (ref 39.0–52.0)
Hemoglobin: 9.7 g/dL — ABNORMAL LOW (ref 13.0–17.0)
LYMPHS ABS: 0.7 10*3/uL (ref 0.7–4.0)
LYMPHS PCT: 23 %
MCH: 30.5 pg (ref 26.0–34.0)
MCHC: 33.2 g/dL (ref 30.0–36.0)
MCV: 91.8 fL (ref 78.0–100.0)
MONO ABS: 0.5 10*3/uL (ref 0.1–1.0)
MONOS PCT: 16 %
NEUTROS ABS: 1.7 10*3/uL (ref 1.7–7.7)
Neutrophils Relative %: 54 %
Platelets: 55 10*3/uL — ABNORMAL LOW (ref 150–400)
RBC: 3.18 MIL/uL — ABNORMAL LOW (ref 4.22–5.81)
RDW: 18.4 % — ABNORMAL HIGH (ref 11.5–15.5)
WBC: 3 10*3/uL — ABNORMAL LOW (ref 4.0–10.5)

## 2015-06-12 LAB — COMPREHENSIVE METABOLIC PANEL
ALBUMIN: 2.8 g/dL — AB (ref 3.5–5.0)
ALT: 13 U/L — ABNORMAL LOW (ref 17–63)
ANION GAP: 8 (ref 5–15)
AST: 14 U/L — AB (ref 15–41)
Alkaline Phosphatase: 92 U/L (ref 38–126)
BUN: 16 mg/dL (ref 6–20)
CHLORIDE: 105 mmol/L (ref 101–111)
CO2: 25 mmol/L (ref 22–32)
Calcium: 8.4 mg/dL — ABNORMAL LOW (ref 8.9–10.3)
Creatinine, Ser: 1.24 mg/dL (ref 0.61–1.24)
GFR calc Af Amer: >60 mL/min (ref >60)
GFR calc non Af Amer: >60 mL/min (ref >60)
GLUCOSE: 88 mg/dL (ref 65–99)
POTASSIUM: 4.7 mmol/L (ref 3.5–5.1)
SODIUM: 138 mmol/L (ref 135–145)
Total Bilirubin: 0.5 mg/dL (ref 0.3–1.2)
Total Protein: 5.7 g/dL — ABNORMAL LOW (ref 6.5–8.1)

## 2015-06-12 LAB — BRAIN NATRIURETIC PEPTIDE: B Natriuretic Peptide: 268 pg/mL — ABNORMAL HIGH (ref 0.0–100.0)

## 2015-06-12 NOTE — Progress Notes (Signed)
PROGRESS NOTE    Anthony Cameron  YHC:[id number] DOB: Jan 10, 1951 DOA: 06/09/2015 PCP: [name], MD  Outpatient Specialists:    Brief Narrative:  Patient is a 65 year old man admitted to the hospital on 4/24 with complaints of shortness of breath. He has a history of esophageal cancer and is currently undergoing chemotherapy under the care of Dr. [name]. He recently had a thoracentesis for pleural effusion. He presents with a cough for several days and fevers and was hypoxic at 87% on room air and what has been diagnosed with a hospital-acquired pneumonia.   Assessment & Plan:   Principal Problem:   HCAP (healthcare-associated pneumonia) Active Problems:   Acute kidney injury ([place])   Pleural effusion, bilateral   Antineoplastic chemotherapy induced pancytopenia ([place])   Malignant neoplasm of lower third of esophagus ([facility])   ANXIETY DEPRESSION   Essential hypertension, benign   LOW BACK PAIN, CHRONIC   Esophageal cancer ([place])   Palliative care encounter   DNR (do not resuscitate) discussion   1. Healthcare associated pneumonia. On admission, the patient was febrile with a temperature of 100.3. He was oxygenating 95% on room air. His white blood cell count was within normal limits at 4.2. His BNP was 650. His chest x-ray revealed worsening loculated bilateral pleural effusions and bilateral lower lobe airspace opacities, concerning for pneumonia. -Patient underwent further imaging with a CT of the chest without contrast and revealed stable right-sided pleural effusion with improved aeration in the right lung and increased left-sided loculated pleural effusion with consolidation. -Patient was started on vancomycin and Zosyn. -Appears to be improving clinically and symptomatically. Due to his immunocompromised state, will provide 1 more day of IV antibiotics before transitioning him to oral antibiotics.  Bilateral pleural effusions with possible loculation. 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 Oncology ordered an outpatient CT angiogram of the chest to assess for PE on 06/05/15. He was negative for PE, but with pleural effusions. Right-sided thoracentesis was performed and yielded 400 cc. Per review of the chart, the pleural fluid culture has remained negative. -Patient appears to be stable and does not appear to be in any shortness of breath. -We will consider thoracentesis again-on the left- if he decompensates. This option was briefly discussed with the radiologist and then the patient. Per Dr.Register, the effusions appear to be moderate, but per his opinion likely not loculated. -We will hold on another thoracentesis and treat the patient for pneumonia. -We'll check a follow-up chest x-ray tomorrow morning.  Esophageal cancer. Patient is followed by Dr. [name]. He is being treated with chemotherapy.  -Patient is full code. Palliative care was consulted and consultation noted and appreciated. -Continue MS Contin for pain.  Pancytopenia: Patient's WBC was 4.2 on admission with a hemoglobin of 10.1 and a platelet count of 82. His pancytopenia is likely secondary to esophageal cancer with metastasis and chemotherapy. No signs of bleeding. -We'll continue to monitor his blood counts closely as they are trending downward, but not in arrange for transfusion. We'll consider input by oncology if his blood counts continue to trend downward.  Acute kidney injury. The patient's creatinine was 1.41 on admission, but it had been within normal limits at 1.05 on 06/05/15. Patient was given IV fluids in the ED. His creatinine has improved to 1.3, but not back to baseline. Gentle IV fluids were started. His renal function has improved.   Hypertension. Currently stable on metoprolol.     DVT prophylaxis: SCDs Code  Status: Full code Family Communication: Discussed briefly with cousin with permission. Disposition Plan:  Discharged home in the next 24   Consultants:   None  Procedures:   None  Antimicrobials:   Zosyn  Vancomycin    Subjective: Patient denies shortness of breath at rest, but does have some slight shortness of breath with ambulation to the bathroom. He denies chest pain.  Objective: Filed Vitals:   06/11/15 2017 06/11/15 2146 06/12/15 0600 06/12/15 1403  BP:  142/76 133/69 133/68  Pulse:  66 70 64  Temp:  98.4 F (36.9 C) 98.5 F (36.9 C) 98.6 F (37 C)  TempSrc:  Oral Oral Oral  Resp:  '20 20 19  '$ Height:      Weight:      SpO2: 95% 96% 97% 95%    Intake/Output Summary (Last 24 hours) at 06/12/15 1652 Last data filed at 06/12/15 1033  Gross per 24 hour  Intake    500 ml  Output   2600 ml  Net  -2100 ml   Filed Weights   06/09/15 1412 06/09/15 2048  Weight: 77.111 kg (170 lb) 78.699 kg (173 lb 8 oz)    Examination:  General exam: Appears calm and comfortable; no acute distress.  Respiratory system: Few fine crackles bilaterally with decreased breath sounds in the bases. Breathing nonlabored at rest. Cardiovascular system: S1 & S2 with a soft systolic murmur. No pedal edema. Gastrointestinal system: Abdomen is nondistended, soft and nontender. No organomegaly or masses felt. Normal bowel sounds heard. Central nervous system: Alert and oriented. No focal neurological deficits. Extremities:No acute hot red joints. Skin: No rashes, lesions or ulcers Psychiatry: Judgement and insight appear normal. Mood & affect appropriate.     Data Reviewed: I have personally reviewed following labs and imaging studies  CBC:  Recent Labs Lab 06/09/15 1435 06/11/15 0504 06/12/15 0544  WBC 4.2 3.3* 3.0*  NEUTROABS 2.8  --  1.7  HGB 10.1* 9.7* 9.7*  HCT 30.9* 29.5* 29.2*  MCV 93.9 91.9 91.8  PLT 82* 62* 55*   Basic Metabolic Panel:  Recent Labs Lab 06/09/15 1435 06/11/15 0504 06/12/15 0544  NA 139 137 138  K 4.5 4.4 4.7  CL 108 106 105  CO2 '25 25 25    '$ GLUCOSE 92 94 88  BUN 28* 21* 16  CREATININE 1.41* 1.30* 1.24  CALCIUM 8.3* 8.2* 8.4*   GFR: Estimated Creatinine Clearance: 66.9 mL/min (by C-G formula based on Cr of 1.24). Liver Function Tests:  Recent Labs Lab 06/09/15 1435 06/12/15 0544  AST 24 14*  ALT 23 13*  ALKPHOS 121 92  BILITOT 0.7 0.5  PROT 5.9* 5.7*  ALBUMIN 3.1* 2.8*   No results for input(s): LIPASE, AMYLASE in the last 168 hours. No results for input(s): AMMONIA in the last 168 hours. Coagulation Profile: No results for input(s): INR, PROTIME in the last 168 hours. Cardiac Enzymes:  Recent Labs Lab 06/09/15 1435  TROPONINI 0.03   BNP (last 3 results) No results for input(s): PROBNP in the last 8760 hours. HbA1C: No results for input(s): HGBA1C in the last 72 hours. CBG: No results for input(s): GLUCAP in the last 168 hours. Lipid Profile: No results for input(s): CHOL, HDL, LDLCALC, TRIG, CHOLHDL, LDLDIRECT in the last 72 hours. Thyroid Function Tests: No results for input(s): TSH, T4TOTAL, FREET4, T3FREE, THYROIDAB in the last 72 hours. Anemia Panel: No results for input(s): VITAMINB12, FOLATE, FERRITIN, TIBC, IRON, RETICCTPCT in the last 72 hours. Urine analysis:  Component Value Date/Time   COLORURINE YELLOW 05/29/2015 0830   APPEARANCEUR CLEAR 05/29/2015 0830   LABSPEC 1.015 05/29/2015 0830   PHURINE 6.5 05/29/2015 0830   GLUCOSEU NEGATIVE 05/29/2015 0830   HGBUR TRACE* 05/29/2015 0830   HGBUR negative 06/28/2007 1004   BILIRUBINUR NEGATIVE 05/29/2015 0830   KETONESUR NEGATIVE 05/29/2015 0830   PROTEINUR NEGATIVE 05/29/2015 0830   UROBILINOGEN 1.0 06/28/2007 1004   NITRITE NEGATIVE 05/29/2015 0830   LEUKOCYTESUR NEGATIVE 05/29/2015 0830   Sepsis Labs: '@LABRCNTIP'$ (procalcitonin:4,lacticidven:4)  ) Recent Results (from the past 240 hour(s))  Gram stain     Status: None   Collection Time: 06/05/15 12:45 PM  Result Value Ref Range Status   Specimen Description THORACIC  Final    Special Requests NONE  Final   Gram Stain   Final    NO ORGANISMS SEEN CYTOSPIN SMEAR Performed at [facility]    Report Status 06/05/2015 FINAL  Final  Culture, body fluid-bottle     Status: None   Collection Time: 06/05/15 12:49 PM  Result Value Ref Range Status   Specimen Description THORACIC  Final   Special Requests BOTTLES DRAWN AEROBIC AND ANAEROBIC [place]  Final   Culture NO GROWTH 5 DAYS  Final   Report Status 06/10/2015 FINAL  Final  Culture, blood (routine x 2) Call MD if unable to obtain prior to antibiotics being given     Status: None (Preliminary result)   Collection Time: 06/09/15  8:58 PM  Result Value Ref Range Status   Specimen Description BLOOD LEFT ANTECUBITAL  Final   Special Requests BOTTLES DRAWN AEROBIC AND ANAEROBIC 10CC EACH  Final   Culture NO GROWTH 3 DAYS  Final   Report Status PENDING  Incomplete  Culture, blood (routine x 2) Call MD if unable to obtain prior to antibiotics being given     Status: None (Preliminary result)   Collection Time: 06/09/15  9:13 PM  Result Value Ref Range Status   Specimen Description BLOOD RIGHT HAND  Final   Special Requests   Final    BOTTLES DRAWN AEROBIC AND ANAEROBIC AEB=5CC ANA=2CC   Culture NO GROWTH 3 DAYS  Final   Report Status PENDING  Incomplete         Radiology Studies: No results found.      Scheduled Meds: . metoprolol tartrate  12.5 mg Oral Q24H  . morphine  15 mg Oral BID  . piperacillin-tazobactam (ZOSYN)  IV  3.375 g Intravenous Q8H  . senna-docusate  1 tablet Oral QHS  . sodium chloride flush  3 mL Intravenous Q12H  . vancomycin  750 mg Intravenous Q12H   Continuous Infusions: . sodium chloride       LOS: 3 days    Time spent: 8 minutes    Rexene Alberts, MD [facility] Pager [phone]  If 7PM-7AM, please contact night-coverage [email or web address] Password [facility] 06/12/2015, 4:52 PM

## 2015-06-12 NOTE — Progress Notes (Signed)
SATURATION QUALIFICATIONS: (This note is used to comply with regulatory documentation for home oxygen)  Patient Saturations on Room Air at Rest = 95%  Patient Saturations on Room Air while Ambulating = 96%  Patient Saturations on 0 Liters of oxygen while Ambulating = n/a Please briefly explain why patient needs home oxygen:

## 2015-06-13 ENCOUNTER — Inpatient Hospital Stay (HOSPITAL_COMMUNITY): Payer: Medicaid Other

## 2015-06-13 DIAGNOSIS — J90 Pleural effusion, not elsewhere classified: Secondary | ICD-10-CM | POA: Diagnosis present

## 2015-06-13 LAB — VANCOMYCIN, TROUGH: VANCOMYCIN TR: 19 ug/mL (ref 10.0–20.0)

## 2015-06-13 LAB — BASIC METABOLIC PANEL
Anion gap: 7 (ref 5–15)
BUN: 16 mg/dL (ref 6–20)
CALCIUM: 8.7 mg/dL — AB (ref 8.9–10.3)
CO2: 26 mmol/L (ref 22–32)
CREATININE: 1.26 mg/dL — AB (ref 0.61–1.24)
Chloride: 104 mmol/L (ref 101–111)
GFR calc Af Amer: >60 mL/min (ref >60)
GFR, EST NON AFRICAN AMERICAN: 59 mL/min — AB (ref >60)
GLUCOSE: 91 mg/dL (ref 65–99)
POTASSIUM: 4.7 mmol/L (ref 3.5–5.1)
SODIUM: 137 mmol/L (ref 135–145)

## 2015-06-13 LAB — CBC
HCT: 30.7 % — ABNORMAL LOW (ref 39.0–52.0)
Hemoglobin: 10.2 g/dL — ABNORMAL LOW (ref 13.0–17.0)
MCH: 30.4 pg (ref 26.0–34.0)
MCHC: 33.2 g/dL (ref 30.0–36.0)
MCV: 91.6 fL (ref 78.0–100.0)
PLATELETS: 60 10*3/uL — AB (ref 150–400)
RBC: 3.35 MIL/uL — AB (ref 4.22–5.81)
RDW: 18.2 % — AB (ref 11.5–15.5)
WBC: 2.9 10*3/uL — ABNORMAL LOW (ref 4.0–10.5)

## 2015-06-13 MED ORDER — HYDROCODONE-ACETAMINOPHEN 10-325 MG PO TABS: 1.0000 | ORAL_TABLET | ORAL | Status: DC | PRN

## 2015-06-13 MED ORDER — SENNOSIDES-DOCUSATE SODIUM 8.6-50 MG PO TABS: 1.0000 | ORAL_TABLET | Freq: Every day | ORAL | Status: AC

## 2015-06-13 MED ORDER — LEVOFLOXACIN 500 MG PO TABS: 500.0000 mg | ORAL_TABLET | Freq: Every day | ORAL | Status: AC

## 2015-06-13 MED ORDER — OLOPATADINE HCL 0.2 % OP SOLN: 1.0000 [drp] | Freq: Every day | OPHTHALMIC | Status: AC | PRN

## 2015-06-13 NOTE — Progress Notes (Signed)
Discharge instructions given on medications,and follow up visits. Patient verbalized understanding. No c/o pain or discomfort noted. Accompanied by staff to an awaiting vehicle.

## 2015-06-13 NOTE — Progress Notes (Signed)
Daily Progress Note   Patient Name: Anthony Cameron       Date: 06/13/2015 DOB: 19-Nov-1950  Age: 65 y.o. MRN#: [id number] Attending Physician: Rexene Alberts, MD Primary Care Physician: [name], MD Admit Date: 06/09/2015  Reason for Consultation/Follow-up: Establishing goals of care and Psychosocial/spiritual support  Subjective: Anthony Cameron is resting quietly in bed. He appears to be thin and frail. He greets me and makes eye contact as I enter. He denies pain, nausea, or anxiety. He makes minimal eye contact during our conversation, will often look in the other direction. We talk about how he will manage at home. He shares that he is able to make it to his cancer treatment appointments.  He will drive himself home when he is discharged today. He does share that he has a sister who can help him when needed.  No interventions needed at this time. Thank you for allowing me to make early contact with Anthony Cameron.  Length of Stay: 4  Current Medications: Scheduled Meds:  . metoprolol tartrate  12.5 mg Oral Q24H  . morphine  15 mg Oral BID  . piperacillin-tazobactam (ZOSYN)  IV  3.375 g Intravenous Q8H  . senna-docusate  1 tablet Oral QHS  . sodium chloride flush  3 mL Intravenous Q12H  . vancomycin  750 mg Intravenous Q12H    Continuous Infusions:    PRN Meds: sodium chloride, sodium chloride flush  Physical Exam  Constitutional: No distress.  Pulmonary/Chest: Effort normal. No respiratory distress.  Abdominal: Soft. He exhibits no distension.  Nursing note and vitals reviewed.           Vital Signs: BP 132/68 mmHg  Pulse 68  Temp(Src) 98.5 F (36.9 C) (Oral)  Resp 20  Ht 6' (1.829 m)  Wt 78.699 kg (173 lb 8 oz)  BMI 23.53 kg/m2  SpO2 97% SpO2: SpO2: 97  % O2 Device: O2 Device: Not Delivered O2 Flow Rate: O2 Flow Rate (L/min): 1.5 L/min  Intake/output summary:  Intake/Output Summary (Last 24 hours) at 06/13/15 1240 Last data filed at 06/13/15 1200  Gross per 24 hour  Intake   1080 ml  Output   2400 ml  Net  -1320 ml   LBM: Last BM Date: 06/09/15 Baseline Weight: Weight: 77.111 kg (170 lb) Most recent weight: Weight: 78.699 kg (173  lb 8 oz)       Palliative Assessment/Data:    Flowsheet Rows        Most Recent Value   Intake Tab    Referral Department  Hospitalist   Unit at Time of Referral  Med/Surg Unit   Palliative Care Primary Diagnosis  Pulmonary   Date Notified  06/10/15   Palliative Care Type  New Palliative care   Reason for referral  Advance Care Planning   Date of Admission  06/09/15   Date first seen by Palliative Care  06/11/15   # of days Palliative referral response time  1 Day(s)   # of days IP prior to Palliative referral  1   Clinical Assessment    Palliative Performance Scale Score  60%   Pain Max last 24 hours  2   Pain Min Last 24 hours  1   Dyspnea Max Last 24 Hours  4   Dyspnea Min Last 24 hours  1   Psychosocial & Spiritual Assessment    Palliative Care Outcomes    Patient/Family meeting held?  Yes   Who was at the meeting?  Meeting with patient only today   Palliative Care Outcomes  Clarified goals of care, Provided psychosocial or spiritual support   Palliative Care follow-up planned  -- [Follow-up at [facility]]      Patient Active Problem List   Diagnosis Date Noted  . Acute kidney injury ([place]) 06/11/2015  . Antineoplastic chemotherapy induced pancytopenia ([place]) 06/11/2015  . Pleural effusion, bilateral 06/11/2015  . Palliative care encounter   . DNR (do not resuscitate) discussion   . Malignant neoplasm of lower third of esophagus ([place])   . HCAP (healthcare-associated pneumonia) 06/09/2015  . Liver metastases ([place]) 04/18/2015  . Esophageal cancer ([place]) 06/14/2014  . History of colonic  polyps   . Dysphagia, pharyngoesophageal phase   . Normocytic anemia 05/27/2014  . GERD (gastroesophageal reflux disease) 05/27/2014  . Esophageal dysphagia 05/27/2014  . Odynophagia 05/27/2014  . Hx of adenomatous colonic polyps 05/27/2014  . AORTIC REGURGITATION, MILD 08/22/2007  . ERECTILE DYSFUNCTION 04/05/2007  . Essential hypertension, benign 01/09/2007  . ANXIETY DEPRESSION 12/12/2006  . KNEE PAIN, LEFT 12/12/2006  . MIGRAINE HEADACHE 11/24/2006  . LOW BACK PAIN, CHRONIC 11/24/2006  . INGUINAL HERNIA, HX OF 11/24/2006    Palliative Care Assessment & Plan   Patient Profile: Anthony Cameron is a 65 year old male with history of esophageal cancer who is continuing with 2 more treatments of chemotherapy here at [facility].  Assessment: as above  Recommendations/Plan:  Anthony Cameron states he wishes to continue with schedule cancer treatments, he also is open to continue thoracentesis as needed. He shares that he at this point, would want CPR and intubation, and PEG tube if needed.  Goals of Care and Additional Recommendations:  Limitations on Scope of Treatment: No limitations at this time.  Code Status:    Code Status Orders        Start     Ordered   06/09/15 2053  Full code   Continuous     06/09/15 2052    Code Status History    Date Active Date Inactive Code Status Order ID Comments User Context   06/05/2015 11:12 AM 06/09/2015  8:52 PM Full Code [id number] 06/05/2015 [facility], PA-C Outpatient   06/27/2014 10:55 AM 06/28/2014  3:47 AM Full Code [id number]  Marybelle Killings, MD HOV       Prognosis:   Unable to  determine, based on outcomes of cancer treatment.  Discharge Planning:  Home with [place] was discussed with Nursing staff, case manager, social worker, and Dr. Caryn Section on next rounds.  Thank you for allowing the Palliative Medicine Team to assist in the care of this patient.   Time In: 0915 Time Out: 0930 Total Time 15  minutes Prolonged Time Billed  no       Greater than 50%  of this time was spent counseling and coordinating care related to the above assessment and plan.  Dove,Tasha A, NP  Please contact Palliative Medicine Team phone at [phone] for questions and concerns.

## 2015-06-13 NOTE — Discharge Summary (Addendum)
Physician Discharge Summary  Anthony Cameron HWE:[id number] DOB: 03/04/1950 DOA: 06/09/2015  PCP: [name], MD  Admit date: 06/09/2015 Discharge date: 06/13/2015  Time spent: Greater than 30 minutes  Recommendations for Outpatient Follow-up:  1. Recommend follow-up of the patient's blood counts at his office follow-up appointment with oncology.     Discharge Diagnoses:  1. Healthcare associated pneumonia. 2. Bilateral pleural effusions, possibly with loculation; likely parapneumonic. -CHF was not assessed and was not clinically determined. 3. Acute respiratory failure with hypoxia secondary to pneumonia and bilateral pleural effusions. Hypoxia resolved. 4. 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 5. Pancytopenia, secondary to chemotherapy. 6. Acute kidney injury, secondary to prerenal azotemia. 7. Hypertension.   Discharge Condition: Improved.  Diet recommendation: As tolerated; heart healthy.  Filed Weights   06/09/15 1412 06/09/15 2048  Weight: 77.111 kg (170 lb) 78.699 kg (173 lb 8 oz)    History of present illness:  Patient is a 65 year old man admitted to the hospital on 06/09/15 with complaints of shortness of breath. He has a history of esophageal cancer and is currently undergoing chemotherapy under the care of Dr. [name]. He recently had a thoracentesis for bilateral pleural effusion, but the tap was on the right. He presented with a cough for several days and fevers and was hypoxic at 87% on room air in the ED. His chest x-ray revealed worsening loculated bilateral pleural effusions and bilateral lower lobe airspace opacities, concerning for pneumonia. He was admitted for further evaluation and management.  Hospital Course:  1. Healthcare associated pneumonia. On admission, the patient was febrile with a temperature of 100.3. He was oxygenating 95% on room air. His white blood cell count was within normal limits at 4.2. His BNP was 650.  His chest x-ray revealed worsening loculated bilateral pleural effusions and bilateral lower lobe airspace opacities, concerning for pneumonia. -Patient underwent further imaging with a CT of the chest without contrast and revealed stable right-sided pleural effusion with improved aeration in the right lung and increased left-sided loculated pleural effusion with consolidation. -Patient was started on vancomycin and Zosyn. They were continued for 4 days. He was discharged on 4 more days of Levaquin. Blood cultures ordered remained negative.  Acute respiratory failure with a pocket, secondary to pneumonia and bilateral pleural effusions. Patient's oxygen saturation was noted to be 87% on room air in the ED. He was started on nasal cannula oxygen. With treatment of his pneumonia, his oxygen saturation is improved. He was oxygenating 97% on room air at the time of discharge.  Bilateral pleural effusions with possible loculation. 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 Oncology ordered an outpatient CT angiogram of the chest to assess for PE on 06/05/15. It was negative for PE, but with pleural effusions. Right-sided thoracentesis was performed and yielded 400 cc. Per review of the chart, the pleural fluid culture has remained negative. -Another thoracentesis-this time left-sided-was considered. This option was briefly discussed with the radiologist and then the patient. Per Dr.Register, the effusions appeared to be moderate, but per his opinion likely not loculated. -The patient improved clinically and was approximately not short of breath with ambulation and at rest during the last day or 2 of the hospitalization. Therefore, thoracentesis was not done. Follow-up chest x-ray revealed stable bilateral pleural effusions.  Esophageal cancer. Patient is followed by Dr. [name]. He is being treated with chemotherapy.  -Patient is a full code. Palliative care was  consulted and consultation was  noted and appreciated. -Patient was continued on MS Contin for pain.  Pancytopenia: Patient's WBC was 4.2 on admission with a hemoglobin of 10.1 and a platelet count of 82. His pancytopenia was likely secondary to esophageal cancer with metastasis and chemotherapy. There was no evidence of bleeding. At the time of discharge, his white blood cell count trended downward to 2.9, hemoglobin remained stable at 10.2, and platelet count was virtually stable at 60.  Acute kidney injury. The patient's creatinine was 1.41 on admission, but it had been within normal limits at 1.05 on 06/05/15. Patient was given IV fluids in the ED. His creatinine improved progressively and was 1.26 at the time of discharge.  Hypertension. Metoprolol was continued. His blood pressure was stable and controlled.    Procedures:  None  Consultations:  None  Discharge Exam: Filed Vitals:   06/12/15 2237 06/13/15 0626  BP: 128/72 132/68  Pulse: 70 68  Temp: 98.1 F (36.7 C) 98.5 F (36.9 C)  Resp: 20 20  Oxygen saturation 97% on room air.  General exam: No acute distress.  Respiratory system: Few fine crackles in the mid lobes with decreased breath sounds in the bases. Breathing nonlabored at rest. Cardiovascular system: S1 & S2 with a soft systolic murmur. No pedal edema. Gastrointestinal system: Abdomen is nondistended, soft and nontender. No organomegaly or masses felt. Normal bowel sounds heard.   Discharge Instructions   Discharge Instructions    Diet - low sodium heart healthy    Complete by:  As directed      Increase activity slowly    Complete by:  As directed           Current Discharge Medication List    START taking these medications   Details  senna-docusate (SENOKOT-S) 8.6-50 MG tablet Take 1 tablet by mouth at bedtime.      CONTINUE these medications which have CHANGED   Details  HYDROcodone-acetaminophen (NORCO) 10-325 MG tablet Take 1 tablet by  mouth every 4 (four) hours as needed for moderate pain or severe pain.   Associated Diagnoses: Malignant neoplasm of lower third of esophagus (HCC)    levofloxacin (LEVAQUIN) 500 MG tablet Take 1 tablet (500 mg total) by mouth daily. Take for 4 more days as directed.   Associated Diagnoses: Pleural effusion, bilateral    Olopatadine HCl (PATADAY) 0.2 % SOLN Apply 1 drop to eye daily as needed (for allergy eye relief).   Associated Diagnoses: Allergic conjunctivitis, bilateral      CONTINUE these medications which have NOT CHANGED   Details  aspirin EC 81 MG tablet Take 1 tablet (81 mg total) by mouth daily.    CISPLATIN IV Inject into the vein. Weekly starting 03/27/15   Associated Diagnoses: Malignant neoplasm of lower third of esophagus (HCC)    esomeprazole (NEXIUM 24HR) 20 MG capsule Take 1 capsule (20 mg total) by mouth 2 (two) times daily before a meal. Qty: 28 capsule, Refills: 0    IRINOTECAN HCL IV Inject into the vein. Starting weekly 03/27/15   Associated Diagnoses: Malignant neoplasm of lower third of esophagus (HCC)    lidocaine-prilocaine (EMLA) cream Apply a quarter size amount to port site 1 hour prior to chemo. Do not rub in. Cover with plastic wrap. Qty: 30 g, Refills: 3   Associated Diagnoses: Esophageal cancer (HCC)    metoprolol tartrate (LOPRESSOR) 25 MG tablet Take 0.5 tablets (12.5 mg total) by mouth 1 day or 1 dose. Qty: 30 tablet, Refills: 10  Misc Natural Products (OSTEO BI-FLEX ADV JOINT SHIELD PO) Take 1 tablet by mouth daily.     morphine (MS CONTIN) 15 MG 12 hr tablet Take 1 tablet (15 mg total) by mouth 2 (two) times daily. Qty: 60 tablet, Refills: 0   Associated Diagnoses: Malignant neoplasm of lower third of esophagus (HCC)    ondansetron (ZOFRAN) 8 MG tablet Take 1 tablet (8 mg total) by mouth every 8 (eight) hours as needed for nausea or vomiting. Qty: 30 tablet, Refills: 2    prochlorperazine (COMPAZINE) 10 MG tablet Take 1 tablet (10 mg  total) by mouth every 6 (six) hours as needed for nausea or vomiting. Qty: 30 tablet, Refills: 2   Associated Diagnoses: Esophageal cancer ([place])    Ramucirumab (CYRAMZA IV) Inject into the vein. Every 14 days    simvastatin (ZOCOR) 20 MG tablet Take 1 tablet (20 mg total) by mouth daily. Qty: 30 tablet, Refills: 6       No Known Allergies Follow-up Information    Follow up with KEFALAS,THOMAS, PA-C.   Specialty:  Oncology   Why:  Follow up as scheduled on 06/17/15.   Contact information:   [place] [id number] [phone]        The results of significant diagnostics from this hospitalization (including imaging, microbiology, ancillary and laboratory) are listed below for reference.    Significant Diagnostic Studies: Dg Chest 1 View  06/05/2015  CLINICAL DATA:  Status post thoracentesis. EXAM: CHEST 1 VIEW COMPARISON:  CT scan of June 05, 2015. FINDINGS: Mild cardiomegaly is noted. Right internal jugular Port-A-Cath is noted with tip in expected position of SVC. No pneumothorax is noted. Mild bilateral loculated pleural effusions are noted, with left greater than right. Bibasilar opacities are noted most consistent with subsegmental atelectasis. Bony thorax is unremarkable. IMPRESSION: Mild bilateral loculated pleural effusions are noted with associated atelectasis, left greater than right. Electronically Signed   By: Marijo Conception, M.D.   On: 06/05/2015 13:04   Dg Chest 2 View  06/13/2015  CLINICAL DATA:  Pt states he is sob and has hx of esophageal cancer and bilateral pleural effusion. EXAM: CHEST  2 VIEW COMPARISON:  CT 06/10/2015 new FINDINGS: Prior port in the RIGHT chest wall. Tip in distal SVC. Normal cardiac silhouette ectatic aorta. Bilateral pleural effusions which are moderate volume are not changed from prior. No pulmonary edema. No pneumothorax. IMPRESSION: No interval change.  Bilateral moderate pleural effusions. Electronically Signed   By: Suzy Bouchard M.D.   On: 06/13/2015 09:25   Dg Chest 2 View  06/09/2015  CLINICAL DATA:  Shortness of breath. Bilateral lower extremity edema. EXAM: CHEST  2 VIEW COMPARISON:  06/05/2015 FINDINGS: There is cardiomegaly. Moderate bilateral pleural effusions which appear partially loculated. These have enlarged since prior study. Increasing bibasilar opacities. Appearance concerning for pneumonia. Right Port-A-Cath is unchanged. IMPRESSION: Worsening loculated bilateral pleural effusions and bilateral lower lobe airspace opacities concerning for pneumonia. Electronically Signed   By: Rolm Baptise M.D.   On: 06/09/2015 15:37   Ct Chest Wo Contrast  06/10/2015  CLINICAL DATA:  Bilateral pleural effusions EXAM: CT CHEST WITHOUT CONTRAST TECHNIQUE: Multidetector CT imaging of the chest was performed following the standard protocol without IV contrast. COMPARISON:  06/05/2015 FINDINGS: Large right-sided pleural effusion is again identified and shows some changes suggestive of loculation similar to that seen on the prior exam. No significant increase is noted. There is some improved aeration in the right lower lobe  when compared with the prior study. Scattered calcified granulomas as well as nodular changes are noted within the right lung stable from the recent exam. The left hemi thorax also demonstrates persistent pleural effusion. The overall appearance of the fluid has increased slightly in the interval from the prior exam. Changes again consistent with loculation are noted. Increase in the degree of left lower lobe consolidation is noted. The previously seen nodules are less well appreciated due to the consolidation. The thoracic aorta shows calcifications and is stable. The thoracic inlet is within normal limits. A right-sided chest wall port is seen. No definitive lymphadenopathy is identified at this time. Coronary calcifications are again seen. The visualized upper abdomen again demonstrates a hypodense lesion  within the liver similar to that seen on prior exam from 05/13/2015. This is consistent with metastatic disease. The bony structures are within normal limits. IMPRESSION: Stable appearance of right-sided pleural effusion with improved aeration in the right lung when compared with the prior exam. In the crease seeing left-sided loculated pleural effusion with increased basilar consolidation when compared with the prior study. Pulmonary nodules somewhat obscured when compared with the prior exam but again consistent with metastatic disease. Hepatic metastatic disease. Electronically Signed   By: Inez Catalina M.D.   On: 06/10/2015 13:57   Ct Angio Chest Pe W/cm &/or Wo Cm  06/05/2015  CLINICAL DATA:  Short of breath a to 3 days. Worsening short of breath. History of esophageal carcinoma. Evaluate for pulmonary embolism. EXAM: CT ANGIOGRAPHY CHEST WITH CONTRAST TECHNIQUE: Multidetector CT imaging of the chest was performed using the standard protocol during bolus administration of intravenous contrast. Multiplanar CT image reconstructions and MIPs were obtained to evaluate the vascular anatomy. CONTRAST:  100 mL Isovue COMPARISON:  None. FINDINGS: Mediastinum/Nodes: No filling defects within pulmonary arteries to suggest acute pulmonary embolism. No acute findings aorta great vessels. No pericardial fluid. Esophagus is normal. There is indistinctness of the mediastinal fat planes which is felt to relate to fluid overload. This is new from comparison exam Lungs/Pleura: Bilateral moderate layering pleural effusions are new from prior. There is mild atelectasis lung bases. Within LEFT lower lobe 10 mm x 12 mm nodule is increased from 9 mm by 10 mm on prior remeasured. Nodule appears more dense than comparison exam. Upper abdomen: Limited view of the liver, kidneys, pancreas are unremarkable. Normal adrenal glands. Musculoskeletal: No aggressive osseous lesion. Review of the MIP images confirms the above findings.  IMPRESSION: 1. No evidence of acute pulmonary embolism. 2. Interval development of bilateral partially loculated pleural effusions which are moderate in volume. 3. Interval enlargement of LEFT lower lobe pulmonary nodules concerning for malignancy. Electronically Signed   By: Suzy Bouchard M.D.   On: 06/05/2015 10:20   US Thoracentesis Asp Pleural Space W/img Guide  06/05/2015  INDICATION: Bilateral pleural effusions, shortness of breath, stage IV esophageal cancer EXAM: ULTRASOUND GUIDED RIGHT THORACENTESIS MEDICATIONS: None. COMPLICATIONS: None immediate. PROCEDURE: An ultrasound guided thoracentesis was thoroughly discussed with the patient and questions answered. The benefits, risks, alternatives and complications were also discussed. The patient understands and wishes to proceed with the procedure. Written consent was obtained. Surveillance imaging of the left chest demonstrated a small, loculated left pleural effusion, likely insufficient for thoracentesis. Surveillance imaging of the right chest demonstrated a moderate, loculated right pleural effusion. A single pocket of relatively simple fluid was present which was considered safe for thoracentesis. Ultrasound was performed to localize and mark an adequate pocket of fluid in the right  chest. The area was then prepped and draped in the normal sterile fashion. 1% Lidocaine was used for local anesthesia. Under ultrasound guidance a 19 gauge, 7-cm, Yueh catheter was introduced. Thoracentesis was performed. The catheter was removed and a dressing applied. FINDINGS: A total of approximately 400 mL of yellow fluid was removed. Samples were sent to the laboratory as requested by the clinical team. IMPRESSION: Successful ultrasound guided right thoracentesis yielding 400 mL of pleural fluid. Electronically Signed   By: Julian Hy M.D.   On: 06/05/2015 13:05    Microbiology: Recent Results (from the past 240 hour(s))  Gram stain     Status: None    Collection Time: 06/05/15 12:45 PM  Result Value Ref Range Status   Specimen Description THORACIC  Final   Special Requests NONE  Final   Gram Stain   Final    NO ORGANISMS SEEN CYTOSPIN SMEAR Performed at [facility]    Report Status 06/05/2015 FINAL  Final  Culture, body fluid-bottle     Status: None   Collection Time: 06/05/15 12:49 PM  Result Value Ref Range Status   Specimen Description THORACIC  Final   Special Requests BOTTLES DRAWN AEROBIC AND ANAEROBIC [place]  Final   Culture NO GROWTH 5 DAYS  Final   Report Status 06/10/2015 FINAL  Final  Culture, blood (routine x 2) Call MD if unable to obtain prior to antibiotics being given     Status: None (Preliminary result)   Collection Time: 06/09/15  8:58 PM  Result Value Ref Range Status   Specimen Description BLOOD LEFT ANTECUBITAL  Final   Special Requests BOTTLES DRAWN AEROBIC AND ANAEROBIC 10CC EACH  Final   Culture NO GROWTH 4 DAYS  Final   Report Status PENDING  Incomplete  Culture, blood (routine x 2) Call MD if unable to obtain prior to antibiotics being given     Status: None (Preliminary result)   Collection Time: 06/09/15  9:13 PM  Result Value Ref Range Status   Specimen Description BLOOD RIGHT HAND  Final   Special Requests   Final    BOTTLES DRAWN AEROBIC AND ANAEROBIC AEB=5CC ANA=2CC   Culture NO GROWTH 4 DAYS  Final   Report Status PENDING  Incomplete     Labs: Basic Metabolic Panel:  Recent Labs Lab 06/09/15 1435 06/11/15 0504 06/12/15 0544 06/13/15 0510  NA 139 137 138 137  K 4.5 4.4 4.7 4.7  CL 108 106 105 104  CO2 '25 25 25 26  '$ GLUCOSE 92 94 88 91  BUN 28* 21* 16 16  CREATININE 1.41* 1.30* 1.24 1.26*  CALCIUM 8.3* 8.2* 8.4* 8.7*   Liver Function Tests:  Recent Labs Lab 06/09/15 1435 06/12/15 0544  AST 24 14*  ALT 23 13*  ALKPHOS 121 92  BILITOT 0.7 0.5  PROT 5.9* 5.7*  ALBUMIN 3.1* 2.8*   No results for input(s): LIPASE, AMYLASE in the last 168 hours. No results for  input(s): AMMONIA in the last 168 hours. CBC:  Recent Labs Lab 06/09/15 1435 06/11/15 0504 06/12/15 0544 06/13/15 0510  WBC 4.2 3.3* 3.0* 2.9*  NEUTROABS 2.8  --  1.7  --   HGB 10.1* 9.7* 9.7* 10.2*  HCT 30.9* 29.5* 29.2* 30.7*  MCV 93.9 91.9 91.8 91.6  PLT 82* 62* 55* 60*   Cardiac Enzymes:  Recent Labs Lab 06/09/15 1435  TROPONINI 0.03   BNP: BNP (last 3 results)  Recent Labs  06/09/15 1435 06/12/15 0545  BNP 650.0*  268.0*    ProBNP (last 3 results) No results for input(s): PROBNP in the last 8760 hours.  CBG: No results for input(s): GLUCAP in the last 168 hours.     Signed:  Kayslee Furey MD.  [facility] 06/13/2015, 1:37 PM

## 2015-06-13 NOTE — Progress Notes (Signed)
Pharmacy Antibiotic Note  Anthony Cameron is a 65 y.o. male admitted on 06/09/2015 with pneumonia.  Pharmacy has been consulted for Vancomycin and Zosyn dosing. Vanc trough this am is therapeutic.  Plan: Zosyn 3.375 IV q8h, Extended dosing interval Cont vanc 750 mg IV q12 hours  Height: 6' (182.9 cm) Weight: 173 lb 8 oz (78.699 kg) IBW/kg (Calculated) : 77.6  Temp (24hrs), Avg:98.4 F (36.9 C), Min:98.1 F (36.7 C), Max:98.6 F (37 C)   Recent Labs Lab 06/09/15 1435 06/11/15 0504 06/12/15 0544 06/13/15 0510  WBC 4.2 3.3* 3.0* 2.9*  CREATININE 1.41* 1.30* 1.24 1.26*  VANCOTROUGH  --   --   --  19    Estimated Creatinine Clearance: 65.9 mL/min (by C-G formula based on Cr of 1.26).    No Known Allergies  Antimicrobials this admission: Vancomycin 4/24 >> Zosyn 4.24 >>   Microbiology results: 06/05/15 Body fluid cx>> no growth 4/24 Blood cx>> 4/24 Sputum cx>>  Thank you for allowing pharmacy to be a part of this patient's care.  Excell Seltzer Clinical Pharmacist 06/13/2015 12:08 PM

## 2015-06-14 LAB — CULTURE, BLOOD (ROUTINE X 2)
CULTURE: NO GROWTH
Culture: NO GROWTH
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
## 2015-06-18 NOTE — Assessment & Plan Note (Addendum)
Initially, stage IIIA esophageal carcinoma, squamous cell, HER2 NEGATIVE.   He is S/P weekly carboplatin/paclitaxel with radiation therapy finishing in June 2016 in the curative setting.  Unfortunately, PET scan demonstrated a hypermetabolic superior mediastinal paraesophageal lesion, thereby precluding surgical intervention.  As a result, he went back on treatment with Xeloda + XRT.  He completed XRT on 12/12/2014.  He continued single-agent Xeloda 1500 mg BID 7 days on and 7 days off from 01/26/2015- 03/18/2015.  Restaging CT imaging on 03/14/2015 demonstrated progressive disease with hepatic lesion resulting in an upstaging his disease to a STAGE IV status.  Now on systemic chemotherapy consisting of Cisplatin/Irinotecan/Cyramza beginning on 03/27/2015.  Oncology history is updated.  FULL CODE.  Pretreatment labs today did not satisfy treatment parameters due to an ANC of 0.9 and plate count 98,264. As a result, treatment will be deferred 1 week.  He is due for restaging tests of abdomen given his large right hepatic lesion found in January 2017 leading to change in therapy. I had a frank conversation with the patient reporting that we are getting to the end of treatment options if his disease is progressing. He asked about the role of surgery and he is educated that surgery does not plan a role in his cancer care at this time given his metastatic disease.  If he has progression of disease, he would be best served with palliative/hospice intervention.  I refilled his MS Contin and hydrocodone pain medication.  He will return in one week for follow-up and review of imaging information.

## 2015-06-19 ENCOUNTER — Encounter (HOSPITAL_COMMUNITY): Payer: Self-pay | Admitting: Oncology

## 2015-06-19 ENCOUNTER — Encounter (HOSPITAL_COMMUNITY): Payer: Medicaid Other | Attending: Hematology & Oncology

## 2015-06-19 ENCOUNTER — Encounter (HOSPITAL_BASED_OUTPATIENT_CLINIC_OR_DEPARTMENT_OTHER): Payer: Medicaid Other | Admitting: Oncology

## 2015-06-19 DIAGNOSIS — C159 Malignant neoplasm of esophagus, unspecified: Secondary | ICD-10-CM | POA: Insufficient documentation

## 2015-06-19 DIAGNOSIS — K769 Liver disease, unspecified: Secondary | ICD-10-CM | POA: Diagnosis not present

## 2015-06-19 DIAGNOSIS — C155 Malignant neoplasm of lower third of esophagus: Secondary | ICD-10-CM | POA: Diagnosis not present

## 2015-06-19 LAB — CBC WITH DIFFERENTIAL/PLATELET
BASOS ABS: 0 10*3/uL (ref 0.0–0.1)
BASOS PCT: 1 %
EOS ABS: 0.2 10*3/uL (ref 0.0–0.7)
Eosinophils Relative: 8 %
HCT: 30.1 % — ABNORMAL LOW (ref 39.0–52.0)
Hemoglobin: 9.8 g/dL — ABNORMAL LOW (ref 13.0–17.0)
Lymphocytes Relative: 30 %
Lymphs Abs: 0.7 10*3/uL (ref 0.7–4.0)
MCH: 30.5 pg (ref 26.0–34.0)
MCHC: 32.6 g/dL (ref 30.0–36.0)
MCV: 93.8 fL (ref 78.0–100.0)
MONO ABS: 0.4 10*3/uL (ref 0.1–1.0)
MONOS PCT: 18 %
NEUTROS PCT: 42 %
Neutro Abs: 0.9 10*3/uL — ABNORMAL LOW (ref 1.7–7.7)
Platelets: 85 10*3/uL — ABNORMAL LOW (ref 150–400)
RBC: 3.21 MIL/uL — ABNORMAL LOW (ref 4.22–5.81)
RDW: 17.5 % — AB (ref 11.5–15.5)
WBC: 2.2 10*3/uL — ABNORMAL LOW (ref 4.0–10.5)

## 2015-06-19 LAB — URINALYSIS, DIPSTICK ONLY
BILIRUBIN URINE: NEGATIVE
Glucose, UA: NEGATIVE mg/dL
Ketones, ur: NEGATIVE mg/dL
Leukocytes, UA: NEGATIVE
NITRITE: NEGATIVE
Protein, ur: NEGATIVE mg/dL
pH: 5.5 (ref 5.0–8.0)

## 2015-06-19 LAB — COMPREHENSIVE METABOLIC PANEL
ALBUMIN: 3.5 g/dL (ref 3.5–5.0)
ALT: 14 U/L — ABNORMAL LOW (ref 17–63)
ANION GAP: 8 (ref 5–15)
AST: 20 U/L (ref 15–41)
Alkaline Phosphatase: 94 U/L (ref 38–126)
BUN: 25 mg/dL — ABNORMAL HIGH (ref 6–20)
CHLORIDE: 108 mmol/L (ref 101–111)
CO2: 25 mmol/L (ref 22–32)
Calcium: 9 mg/dL (ref 8.9–10.3)
Creatinine, Ser: 1.44 mg/dL — ABNORMAL HIGH (ref 0.61–1.24)
GFR calc Af Amer: 58 mL/min — ABNORMAL LOW (ref >60)
GFR calc non Af Amer: 50 mL/min — ABNORMAL LOW (ref >60)
GLUCOSE: 109 mg/dL — AB (ref 65–99)
POTASSIUM: 3.8 mmol/L (ref 3.5–5.1)
SODIUM: 141 mmol/L (ref 135–145)
Total Bilirubin: 0.5 mg/dL (ref 0.3–1.2)
Total Protein: 6.7 g/dL (ref 6.5–8.1)

## 2015-06-19 LAB — MAGNESIUM: Magnesium: 1.9 mg/dL (ref 1.7–2.4)

## 2015-06-19 LAB — TSH: TSH: 0.502 u[IU]/mL (ref 0.350–4.500)

## 2015-06-19 MED ORDER — HYDROCODONE-ACETAMINOPHEN 10-325 MG PO TABS: 1.0000 | ORAL_TABLET | ORAL | Status: DC | PRN

## 2015-06-19 MED ORDER — MORPHINE SULFATE ER 15 MG PO TBCR: 15.0000 mg | EXTENDED_RELEASE_TABLET | Freq: Two times a day (BID) | ORAL | Status: DC

## 2015-06-19 NOTE — Patient Instructions (Addendum)
[place] at [facility] Discharge Instructions  RECOMMENDATIONS MADE BY THE CONSULTANT AND ANY TEST RESULTS WILL BE SENT TO YOUR REFERRING PHYSICIAN.  Exam done and seen today by [name] Labs reviewed today Will hold treatment for one week CT scan of abdomen and pelvis with contrast Script for Hydrocodone and MS Contin Return to see the Doctor after scans in one week Call the clinic for any concerns or questions.   Thank you for choosing [place] at [facility] to provide your oncology and hematology care.  To afford each patient quality time with our provider, please arrive at least 15 minutes before your scheduled appointment time.   Beginning January 23rd 2017 lab work for the [facility] will be done in the  Main lab at [facility] on 1st floor. If you have a lab appointment with the [facility] please come in thru the  Main Entrance and check in at the main information desk  You need to re-schedule your appointment should you arrive 10 or more minutes late.  We strive to give you quality time with our providers, and arriving late affects you and other patients whose appointments are after yours.  Also, if you no show three or more times for appointments you may be dismissed from the clinic at the providers discretion.     Again, thank you for choosing [facility].  Our hope is that these requests will decrease the amount of time that you wait before being seen by our physicians.       _____________________________________________________________  Should you have questions after your visit to [facility], please contact our office at [phone] between the hours of 8:30 a.m. and 4:30 p.m.  Voicemails left after 4:30 p.m. will not be returned until the following business day.  For prescription refill requests, have your pharmacy contact our office.         Resources For Cancer Patients and their  Caregivers ? American Cancer Society: Can assist with transportation, wigs, general needs, runs Look Good Feel Better.        (631)468-2343 ? Cancer Care: Provides financial assistance, online support groups, medication/co-pay assistance.  1-800-813-HOPE 8560012584) ? [place] Assists [place] cancer patients and their families through emotional , educational and financial support.  (918) 603-0464 ? [place] DSS Where to apply for food stamps, Medicaid and utility assistance. 646-439-0502 ? [employer]: Transportation to medical appointments. 228-214-0330 ? Social Security Administration: May apply for disability if have a Stage IV cancer. [phone] ? [employer], Disability and Transit Services: Assists with nutrition, care and transit needs. [phone]

## 2015-06-19 NOTE — Progress Notes (Signed)
Anthony Cameron's reason for visit today are for labs as scheduled per MD orders.  Venipuncture performed with a 23 gauge butterfly needle to L Antecubital.  Anthony Cameron tolerated venipuncture well and without incident; questions were answered and patient was discharged.

## 2015-06-24 ENCOUNTER — Telehealth (HOSPITAL_COMMUNITY): Payer: Self-pay | Admitting: Oncology

## 2015-06-24 ENCOUNTER — Ambulatory Visit (HOSPITAL_COMMUNITY): Payer: Medicaid Other

## 2015-06-24 ENCOUNTER — Other Ambulatory Visit (HOSPITAL_COMMUNITY): Payer: Self-pay | Admitting: Oncology

## 2015-06-24 DIAGNOSIS — C155 Malignant neoplasm of lower third of esophagus: Secondary | ICD-10-CM

## 2015-06-24 NOTE — Telephone Encounter (Signed)
Peer to peer completed for CT abd/pelvis with contrast.   This study is approved and approval is [id number].  We will ask the patient to come in tomorrow or 5/11 for IV fluids to hopefully improve renal function for contrasted study.  Mihran Lebarron, PA-C 06/24/2015 1:47 PM

## 2015-06-25 ENCOUNTER — Encounter (HOSPITAL_COMMUNITY): Payer: Self-pay | Admitting: Oncology

## 2015-06-25 ENCOUNTER — Encounter (HOSPITAL_BASED_OUTPATIENT_CLINIC_OR_DEPARTMENT_OTHER): Payer: Medicaid Other

## 2015-06-25 ENCOUNTER — Encounter (HOSPITAL_BASED_OUTPATIENT_CLINIC_OR_DEPARTMENT_OTHER): Payer: Medicaid Other | Admitting: Oncology

## 2015-06-25 VITALS — BP 137/74 | HR 66 | Temp 98.3°F | Resp 18 | Wt 159.0 lb

## 2015-06-25 DIAGNOSIS — C787 Secondary malignant neoplasm of liver and intrahepatic bile duct: Secondary | ICD-10-CM

## 2015-06-25 DIAGNOSIS — C155 Malignant neoplasm of lower third of esophagus: Secondary | ICD-10-CM

## 2015-06-25 DIAGNOSIS — J90 Pleural effusion, not elsewhere classified: Secondary | ICD-10-CM

## 2015-06-25 DIAGNOSIS — C159 Malignant neoplasm of esophagus, unspecified: Secondary | ICD-10-CM | POA: Diagnosis not present

## 2015-06-25 DIAGNOSIS — I10 Essential (primary) hypertension: Secondary | ICD-10-CM

## 2015-06-25 LAB — CBC WITH DIFFERENTIAL/PLATELET
BASOS PCT: 1 %
Basophils Absolute: 0 10*3/uL (ref 0.0–0.1)
EOS ABS: 0.3 10*3/uL (ref 0.0–0.7)
Eosinophils Relative: 13 %
HEMATOCRIT: 29 % — AB (ref 39.0–52.0)
HEMOGLOBIN: 9.2 g/dL — AB (ref 13.0–17.0)
Lymphocytes Relative: 31 %
Lymphs Abs: 0.7 10*3/uL (ref 0.7–4.0)
MCH: 30.1 pg (ref 26.0–34.0)
MCHC: 31.7 g/dL (ref 30.0–36.0)
MCV: 94.8 fL (ref 78.0–100.0)
Monocytes Absolute: 0.3 10*3/uL (ref 0.1–1.0)
Monocytes Relative: 13 %
NEUTROS ABS: 0.9 10*3/uL — AB (ref 1.7–7.7)
NEUTROS PCT: 42 %
Platelets: 67 10*3/uL — ABNORMAL LOW (ref 150–400)
RBC: 3.06 MIL/uL — AB (ref 4.22–5.81)
RDW: 18 % — ABNORMAL HIGH (ref 11.5–15.5)
WBC: 2.1 10*3/uL — AB (ref 4.0–10.5)

## 2015-06-25 LAB — COMPREHENSIVE METABOLIC PANEL
ALBUMIN: 3.2 g/dL — AB (ref 3.5–5.0)
ALK PHOS: 76 U/L (ref 38–126)
ALT: 8 U/L — AB (ref 17–63)
AST: 16 U/L (ref 15–41)
Anion gap: 4 — ABNORMAL LOW (ref 5–15)
BILIRUBIN TOTAL: 0.3 mg/dL (ref 0.3–1.2)
BUN: 21 mg/dL — AB (ref 6–20)
CO2: 29 mmol/L (ref 22–32)
CREATININE: 1.18 mg/dL (ref 0.61–1.24)
Calcium: 8.7 mg/dL — ABNORMAL LOW (ref 8.9–10.3)
Chloride: 107 mmol/L (ref 101–111)
GFR calc Af Amer: >60 mL/min (ref >60)
GFR calc non Af Amer: >60 mL/min (ref >60)
GLUCOSE: 85 mg/dL (ref 65–99)
Potassium: 4.6 mmol/L (ref 3.5–5.1)
SODIUM: 140 mmol/L (ref 135–145)
Total Protein: 6.1 g/dL — ABNORMAL LOW (ref 6.5–8.1)

## 2015-06-25 LAB — BRAIN NATRIURETIC PEPTIDE: B Natriuretic Peptide: 242 pg/mL — ABNORMAL HIGH (ref 0.0–100.0)

## 2015-06-25 MED ORDER — SODIUM CHLORIDE 0.9% FLUSH
20.0000 mL | INTRAVENOUS | Status: DC | PRN
  Administered 2015-06-25: 20 mL via INTRAVENOUS
  Filled 2015-06-25: qty 20

## 2015-06-25 MED ORDER — HEPARIN SOD (PORK) LOCK FLUSH 100 UNIT/ML IV SOLN
500.0000 [IU] | Freq: Once | INTRAVENOUS | Status: AC
  Administered 2015-06-25: 500 [IU] via INTRAVENOUS

## 2015-06-25 MED ORDER — HEPARIN SOD (PORK) LOCK FLUSH 100 UNIT/ML IV SOLN
INTRAVENOUS | Status: AC
  Filled 2015-06-25: qty 5
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
## 2015-06-25 NOTE — Assessment & Plan Note (Signed)
Initially, stage IIIA esophageal carcinoma, squamous cell, HER2 NEGATIVE.   He is S/P weekly carboplatin/paclitaxel with radiation therapy finishing in June 2016 in the curative setting.  Unfortunately, PET scan demonstrated a hypermetabolic superior mediastinal paraesophageal lesion, thereby precluding surgical intervention.  As a result, he went back on treatment with Xeloda + XRT.  He completed XRT on 12/12/2014.  He continued single-agent Xeloda 1500 mg BID 7 days on and 7 days off from 01/26/2015- 03/18/2015.  Restaging CT imaging on 03/14/2015 demonstrated progressive disease with hepatic lesion resulting in an upstaging his disease to a STAGE IV status.  Now on systemic chemotherapy consisting of Cisplatin/Irinotecan/Cyramza beginning on 03/27/2015.  Oncology history is updated.  FULL CODE  He is here today for IV fluids given his renal function the other day as we prepare for CT imaging with IV contrast tomorrow.  His renal function is back to baseline and therefore, we will forego IV fluids.  His BNP is stable.  He will return on Friday to review CT imaging results and pursue treatment as planned/scheduled.  Based upon his labs today, he may not meet treatment parameters due to neutropenia and thrombocytopenia.

## 2015-06-25 NOTE — Patient Instructions (Addendum)
[place] at [facility] Discharge Instructions  RECOMMENDATIONS MADE BY THE CONSULTANT AND ANY TEST RESULTS WILL BE SENT TO YOUR REFERRING PHYSICIAN.  Exam and discussion today with [name], PA. IV fluids today (one liter over 3 hours). CT scan as scheduled on 06/27/15. Lab work today (CBC diff and CMET). Office visit with Dr. [name] on 07/02/15. Call the clinic should you have any questions or concerns prior to your next visit.   Thank you for choosing [place] at [facility] to provide your oncology and hematology care.  To afford each patient quality time with our provider, please arrive at least 15 minutes before your scheduled appointment time.   Beginning January 23rd 2017 lab work for the [facility] will be done in the  Main lab at [facility] on 1st floor. If you have a lab appointment with the [facility] please come in thru the  Main Entrance and check in at the main information desk  You need to re-schedule your appointment should you arrive 10 or more minutes late.  We strive to give you quality time with our providers, and arriving late affects you and other patients whose appointments are after yours.  Also, if you no show three or more times for appointments you may be dismissed from the clinic at the providers discretion.     Again, thank you for choosing [facility].  Our hope is that these requests will decrease the amount of time that you wait before being seen by our physicians.       _____________________________________________________________  Should you have questions after your visit to [facility], please contact our office at [phone] between the hours of 8:30 a.m. and 4:30 p.m.  Voicemails left after 4:30 p.m. will not be returned until the following business day.  For prescription refill requests, have your pharmacy contact our office.         Resources For Cancer  Patients and their Caregivers ? American Cancer Society: Can assist with transportation, wigs, general needs, runs Look Good Feel Better.        403-121-8332 ? Cancer Care: Provides financial assistance, online support groups, medication/co-pay assistance.  1-800-813-HOPE 780-083-5734) ? [facility] Assists [place] cancer patients and their families through emotional , educational and financial support.  [phone] ? [place] DSS Where to apply for food stamps, Medicaid and utility assistance. 684-267-8961 ? RCATS: Transportation to medical appointments. 435-154-9511 ? Social Security Administration: May apply for disability if have a Stage IV cancer. 407-511-0707 803-402-9951 ? [employer], Disability and Transit Services: Assists with nutrition, care and transit needs. [place] Support Programs: '@10RELATIVEDAYS'$ @ > Cancer Support Group  2nd Tuesday of the month 1pm-2pm, Journey Room  > Creative Journey  3rd Tuesday of the month 1130am-1pm, Journey Room  > Look Good Feel Better  1st Wednesday of the month 10am-12 noon, Journey Room (Call [place] to register [phone])

## 2015-06-25 NOTE — Progress Notes (Signed)
Patient not to receive IVF r/t lab results.  BNP remains elevated and kidney function has improved.

## 2015-06-25 NOTE — Patient Instructions (Signed)
[place] at [facility] Discharge Instructions  RECOMMENDATIONS MADE BY THE CONSULTANT AND ANY TEST RESULTS WILL BE SENT TO YOUR REFERRING PHYSICIAN.  Labs today.    Thank you for choosing [place] at [facility] to provide your oncology and hematology care.  To afford each patient quality time with our provider, please arrive at least 15 minutes before your scheduled appointment time.   Beginning January 23rd 2017 lab work for the [facility] will be done in the  Main lab at [facility] on 1st floor. If you have a lab appointment with the [facility] please come in thru the  Main Entrance and check in at the main information desk  You need to re-schedule your appointment should you arrive 10 or more minutes late.  We strive to give you quality time with our providers, and arriving late affects you and other patients whose appointments are after yours.  Also, if you no show three or more times for appointments you may be dismissed from the clinic at the providers discretion.     Again, thank you for choosing [facility].  Our hope is that these requests will decrease the amount of time that you wait before being seen by our physicians.       _____________________________________________________________  Should you have questions after your visit to [facility], please contact our office at [phone] between the hours of 8:30 a.m. and 4:30 p.m.  Voicemails left after 4:30 p.m. will not be returned until the following business day.  For prescription refill requests, have your pharmacy contact our office.         Resources For Cancer Patients and their Caregivers ? American Cancer Society: Can assist with transportation, wigs, general needs, runs Look Good Feel Better.        8088698689 ? Cancer Care: Provides financial assistance, online support groups, medication/co-pay assistance.  1-800-813-HOPE  (402) 054-5695) ? [facility] Assists [place] cancer patients and their families through emotional , educational and financial support.  505-435-8770 ? [place] DSS Where to apply for food stamps, Medicaid and utility assistance. (581)198-9554 ? RCATS: Transportation to medical appointments. 7621767661 ? Social Security Administration: May apply for disability if have a Stage IV cancer. ([phone] 936-749-8615 ? [employer], Disability and Transit Services: Assists with nutrition, care and transit needs. [place] Support Programs: '@10RELATIVEDAYS'$ @ > Cancer Support Group  2nd Tuesday of the month 1pm-2pm, Journey Room  > Creative Journey  3rd Tuesday of the month 1130am-1pm, Journey Room  > Look Good Feel Better  1st Wednesday of the month 10am-12 noon, Journey Room (Call [place] to register [phone])

## 2015-06-26 ENCOUNTER — Inpatient Hospital Stay (HOSPITAL_COMMUNITY): Payer: Medicaid Other

## 2015-06-26 ENCOUNTER — Ambulatory Visit (HOSPITAL_COMMUNITY)
Admission: RE | Admit: 2015-06-26 | Discharge: 2015-06-26 | Disposition: A | Discharge status: Home / Self Care | Payer: Medicaid Other | Source: Ambulatory Visit | Attending: Oncology | Admitting: Oncology

## 2015-06-26 DIAGNOSIS — C787 Secondary malignant neoplasm of liver and intrahepatic bile duct: Secondary | ICD-10-CM | POA: Insufficient documentation

## 2015-06-26 DIAGNOSIS — C155 Malignant neoplasm of lower third of esophagus: Secondary | ICD-10-CM | POA: Diagnosis not present

## 2015-06-26 DIAGNOSIS — J90 Pleural effusion, not elsewhere classified: Secondary | ICD-10-CM | POA: Diagnosis not present

## 2015-06-26 DIAGNOSIS — I700 Atherosclerosis of aorta: Secondary | ICD-10-CM | POA: Insufficient documentation

## 2015-06-26 MED ORDER — IOPAMIDOL (ISOVUE-300) INJECTION 61%
100.0000 mL | Freq: Once | INTRAVENOUS | Status: AC | PRN
  Administered 2015-06-26: 100 mL via INTRAVENOUS

## 2015-06-27 ENCOUNTER — Encounter (HOSPITAL_COMMUNITY): Payer: Self-pay | Admitting: Hematology & Oncology

## 2015-06-27 ENCOUNTER — Encounter (HOSPITAL_BASED_OUTPATIENT_CLINIC_OR_DEPARTMENT_OTHER): Payer: Medicaid Other | Admitting: Hematology & Oncology

## 2015-06-27 ENCOUNTER — Encounter (HOSPITAL_BASED_OUTPATIENT_CLINIC_OR_DEPARTMENT_OTHER): Payer: Medicaid Other

## 2015-06-27 ENCOUNTER — Ambulatory Visit (HOSPITAL_COMMUNITY): Payer: Medicaid Other

## 2015-06-27 VITALS — BP 152/77 | HR 60 | Temp 98.4°F | Resp 18

## 2015-06-27 DIAGNOSIS — C787 Secondary malignant neoplasm of liver and intrahepatic bile duct: Secondary | ICD-10-CM

## 2015-06-27 DIAGNOSIS — C155 Malignant neoplasm of lower third of esophagus: Secondary | ICD-10-CM | POA: Diagnosis not present

## 2015-06-27 DIAGNOSIS — F1021 Alcohol dependence, in remission: Secondary | ICD-10-CM | POA: Diagnosis not present

## 2015-06-27 DIAGNOSIS — J029 Acute pharyngitis, unspecified: Secondary | ICD-10-CM

## 2015-06-27 DIAGNOSIS — Z87891 Personal history of nicotine dependence: Secondary | ICD-10-CM

## 2015-06-27 DIAGNOSIS — D696 Thrombocytopenia, unspecified: Secondary | ICD-10-CM | POA: Diagnosis not present

## 2015-06-27 DIAGNOSIS — R070 Pain in throat: Secondary | ICD-10-CM | POA: Diagnosis not present

## 2015-06-27 DIAGNOSIS — D61818 Other pancytopenia: Secondary | ICD-10-CM

## 2015-06-27 DIAGNOSIS — C159 Malignant neoplasm of esophagus, unspecified: Secondary | ICD-10-CM | POA: Diagnosis not present

## 2015-06-27 DIAGNOSIS — R5383 Other fatigue: Secondary | ICD-10-CM

## 2015-06-27 DIAGNOSIS — F1221 Cannabis dependence, in remission: Secondary | ICD-10-CM

## 2015-06-27 LAB — CBC WITH DIFFERENTIAL/PLATELET
Basophils Absolute: 0 10*3/uL (ref 0.0–0.1)
Basophils Relative: 0 %
EOS ABS: 0.3 10*3/uL (ref 0.0–0.7)
EOS PCT: 13 %
HCT: 28.8 % — ABNORMAL LOW (ref 39.0–52.0)
Hemoglobin: 9.3 g/dL — ABNORMAL LOW (ref 13.0–17.0)
LYMPHS ABS: 0.5 10*3/uL — AB (ref 0.7–4.0)
Lymphocytes Relative: 21 %
MCH: 30.5 pg (ref 26.0–34.0)
MCHC: 32.3 g/dL (ref 30.0–36.0)
MCV: 94.4 fL (ref 78.0–100.0)
MONO ABS: 0.4 10*3/uL (ref 0.1–1.0)
MONOS PCT: 14 %
Neutro Abs: 1.4 10*3/uL — ABNORMAL LOW (ref 1.7–7.7)
Neutrophils Relative %: 53 %
PLATELETS: 74 10*3/uL — AB (ref 150–400)
RBC: 3.05 MIL/uL — ABNORMAL LOW (ref 4.22–5.81)
RDW: 17.9 % — AB (ref 11.5–15.5)
WBC: 2.6 10*3/uL — ABNORMAL LOW (ref 4.0–10.5)

## 2015-06-27 LAB — COMPREHENSIVE METABOLIC PANEL
ALK PHOS: 82 U/L (ref 38–126)
ALT: 11 U/L — ABNORMAL LOW (ref 17–63)
ANION GAP: 1 — AB (ref 5–15)
AST: 19 U/L (ref 15–41)
Albumin: 3.4 g/dL — ABNORMAL LOW (ref 3.5–5.0)
BUN: 18 mg/dL (ref 6–20)
CALCIUM: 8.5 mg/dL — AB (ref 8.9–10.3)
CO2: 28 mmol/L (ref 22–32)
CREATININE: 1.03 mg/dL (ref 0.61–1.24)
Chloride: 106 mmol/L (ref 101–111)
GFR calc non Af Amer: >60 mL/min (ref >60)
Glucose, Bld: 131 mg/dL — ABNORMAL HIGH (ref 65–99)
POTASSIUM: 4.7 mmol/L (ref 3.5–5.1)
SODIUM: 135 mmol/L (ref 135–145)
Total Bilirubin: 0.2 mg/dL — ABNORMAL LOW (ref 0.3–1.2)
Total Protein: 6.4 g/dL — ABNORMAL LOW (ref 6.5–8.1)

## 2015-06-27 LAB — MAGNESIUM: Magnesium: 1.6 mg/dL — ABNORMAL LOW (ref 1.7–2.4)

## 2015-06-27 LAB — BRAIN NATRIURETIC PEPTIDE: B Natriuretic Peptide: 442 pg/mL — ABNORMAL HIGH (ref 0.0–100.0)

## 2015-06-27 LAB — TSH: TSH: 0.595 u[IU]/mL (ref 0.350–4.500)

## 2015-06-27 MED ORDER — SODIUM CHLORIDE 0.9% FLUSH
10.0000 mL | INTRAVENOUS | Status: DC | PRN
  Administered 2015-06-27: 10 mL
  Filled 2015-06-27: qty 10

## 2015-06-27 MED ORDER — DEXTROSE-NACL 5-0.45 % IV SOLN
Freq: Once | INTRAVENOUS | Status: DC
  Filled 2015-06-27: qty 10

## 2015-06-27 MED ORDER — AZITHROMYCIN 250 MG PO TABS: ORAL_TABLET | ORAL | Status: DC

## 2015-06-27 MED ORDER — HEPARIN SOD (PORK) LOCK FLUSH 100 UNIT/ML IV SOLN
500.0000 [IU] | Freq: Once | INTRAVENOUS | Status: AC | PRN
  Administered 2015-06-27: 500 [IU]

## 2015-06-27 MED ORDER — SODIUM CHLORIDE 0.9 % IV SOLN: Freq: Once | INTRAVENOUS | Status: DC

## 2015-06-27 MED ORDER — HEPARIN SOD (PORK) LOCK FLUSH 100 UNIT/ML IV SOLN
INTRAVENOUS | Status: AC
  Filled 2015-06-27: qty 5

## 2015-06-27 NOTE — Patient Instructions (Addendum)
[place] at [facility] Discharge Instructions  RECOMMENDATIONS MADE BY THE CONSULTANT AND ANY TEST RESULTS WILL BE SENT TO YOUR REFERRING PHYSICIAN.  Z pack (antibiotic) called in Return next Thursday for possible treatment   Thank you for choosing [place] at [facility] to provide your oncology and hematology care.  To afford each patient quality time with our provider, please arrive at least 15 minutes before your scheduled appointment time.   Beginning January 23rd 2017 lab work for the [facility] will be done in the  Main lab at [facility] on 1st floor. If you have a lab appointment with the [facility] please come in thru the  Main Entrance and check in at the main information desk  You need to re-schedule your appointment should you arrive 10 or more minutes late.  We strive to give you quality time with our providers, and arriving late affects you and other patients whose appointments are after yours.  Also, if you no show three or more times for appointments you may be dismissed from the clinic at the providers discretion.     Again, thank you for choosing [facility].  Our hope is that these requests will decrease the amount of time that you wait before being seen by our physicians.       _____________________________________________________________  Should you have questions after your visit to [facility], please contact our office at [phone] between the hours of 8:30 a.m. and 4:30 p.m.  Voicemails left after 4:30 p.m. will not be returned until the following business day.  For prescription refill requests, have your pharmacy contact our office.         Resources For Cancer Patients and their Caregivers ? American Cancer Society: Can assist with transportation, wigs, general needs, runs Look Good Feel Better.        ([phone] ? Cancer Care: Provides financial assistance, online  support groups, medication/co-pay assistance.  1-800-813-HOPE 772-431-8189) ? [place] Assists [place] cancer patients and their families through emotional , educational and financial support.  6716819149 ? [place] DSS Where to apply for food stamps, Medicaid and utility assistance. 712-714-9813 ? RCATS: Transportation to medical appointments. (820)684-7130 ? Social Security Administration: May apply for disability if have a Stage IV cancer. (808)273-2729 (680)042-7002 ? [employer], Disability and Transit Services: Assists with nutrition, care and transit needs. [place] Support Programs: '@10RELATIVEDAYS'$ @ > Cancer Support Group  2nd Tuesday of the month 1pm-2pm, Journey Room  > Creative Journey  3rd Tuesday of the month 1130am-1pm, Journey Room  > Look Good Feel Better  1st Wednesday of the month 10am-12 noon, Journey Room (Call [place] to register [phone])  .off
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
## 2015-06-27 NOTE — Patient Instructions (Signed)
[place] at [facility] Discharge Instructions  RECOMMENDATIONS MADE BY THE CONSULTANT AND ANY TEST RESULTS WILL BE SENT TO YOUR REFERRING PHYSICIAN.   [place] at [facility] Discharge Instructions  RECOMMENDATIONS MADE BY THE CONSULTANT AND ANY TEST RESULTS WILL BE SENT TO YOUR REFERRING PHYSICIAN.  Defer treatment til next week  Follow up as scheduled  Thank you for choosing [place] at [facility] to provide your oncology and hematology care.  To afford each patient quality time with our provider, please arrive at least 15 minutes before your scheduled appointment time.   Beginning January 23rd 2017 lab work for the [facility] will be done in the  Main lab at [facility] on 1st floor. If you have a lab appointment with the [facility] please come in thru the  Main Entrance and check in at the main information desk  You need to re-schedule your appointment should you arrive 10 or more minutes late.  We strive to give you quality time with our providers, and arriving late affects you and other patients whose appointments are after yours.  Also, if you no show three or more times for appointments you may be dismissed from the clinic at the providers discretion.     Again, thank you for choosing [facility].  Our hope is that these requests will decrease the amount of time that you wait before being seen by our physicians.       _____________________________________________________________  Should you have questions after your visit to [facility], please contact our office at [phone] between the hours of 8:30 a.m. and 4:30 p.m.  Voicemails left after 4:30 p.m. will not be returned until the following business day.  For prescription refill requests, have your pharmacy contact our office.         Resources For Cancer Patients and their Caregivers ? American Cancer  Society: Can assist with transportation, wigs, general needs, runs Look Good Feel Better.        5131849462 ? Cancer Care: Provides financial assistance, online support groups, medication/co-pay assistance.  1-800-813-HOPE 405-325-4269) ? [place] Assists [place] cancer patients and their families through emotional , educational and financial support.  787-396-0818 ? [place] DSS Where to apply for food stamps, Medicaid and utility assistance. (779) 869-7776 ? [employer]: Transportation to medical appointments. (248)498-8092 ? Social Security Administration: May apply for disability if have a Stage IV cancer. (856) 137-2387 701-243-2191 ? [employer], Disability and Transit Services: Assists with nutrition, care and transit needs. [place] Support Programs: '@10RELATIVEDAYS'$ @ > Cancer Support Group  2nd Tuesday of the month 1pm-2pm, Journey Room  > Creative Journey  3rd Tuesday of the month 1130am-1pm, Journey Room  > Look Good Feel Better  1st Wednesday of the month 10am-12 noon, Journey Room (Call [place] to register [phone])      Thank you for choosing [place] at [facility] to provide your oncology and hematology care.  To afford each patient quality time with our provider, please arrive at least 15 minutes before your scheduled appointment time.   Beginning January 23rd 2017 lab work for the [facility] will be done in the  Main lab at [facility] on 1st floor. If you have a lab appointment with the [facility] please come in thru the  Main Entrance and check in at the main  information desk  You need to re-schedule your appointment should you arrive 10 or more minutes late.  We strive to give you quality time with our providers, and arriving late affects you and other patients whose appointments are after yours.  Also, if you no show three or more times for appointments you  may be dismissed from the clinic at the providers discretion.     Again, thank you for choosing [facility].  Our hope is that these requests will decrease the amount of time that you wait before being seen by our physicians.       _____________________________________________________________  Should you have questions after your visit to [facility], please contact our office at [phone] between the hours of 8:30 a.m. and 4:30 p.m.  Voicemails left after 4:30 p.m. will not be returned until the following business day.  For prescription refill requests, have your pharmacy contact our office.         Resources For Cancer Patients and their Caregivers ? American Cancer Society: Can assist with transportation, wigs, general needs, runs Look Good Feel Better.        [phone] ? Cancer Care: Provides financial assistance, online support groups, medication/co-pay assistance.  1-800-813-HOPE (567) 052-2967) ? [place] Assists [place] cancer patients and their families through emotional , educational and financial support.  954-739-8101 ? [place] DSS Where to apply for food stamps, Medicaid and utility assistance. 726-213-7292 ? [employer]: Transportation to medical appointments. (213)687-2818 ? Social Security Administration: May apply for disability if have a Stage IV cancer. (531)404-4834 385-299-1998 ? [employer], Disability and Transit Services: Assists with nutrition, care and transit needs. [place] Support Programs: '@10RELATIVEDAYS'$ @ > Cancer Support Group  2nd Tuesday of the month 1pm-2pm, Journey Room  > Creative Journey  3rd Tuesday of the month 1130am-1pm, Journey Room  > Look Good Feel Better  1st Wednesday of the month 10am-12 noon, Journey Room (Call [place] to register [phone])  ]

## 2015-06-27 NOTE — Progress Notes (Signed)
defer treatment until next week

## 2015-06-30 ENCOUNTER — Encounter (HOSPITAL_COMMUNITY): Payer: Self-pay | Admitting: Hematology & Oncology
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
## 2015-07-02 NOTE — Assessment & Plan Note (Addendum)
Initially, stage IIIA esophageal carcinoma, squamous cell, HER2 NEGATIVE.   He is S/P weekly carboplatin/paclitaxel with radiation therapy finishing in June 2016 in the curative setting.  Unfortunately, PET scan demonstrated a hypermetabolic superior mediastinal paraesophageal lesion, thereby precluding surgical intervention.  As a result, he went back on treatment with Xeloda + XRT.  He completed XRT on 12/12/2014.  He continued single-agent Xeloda 1500 mg BID 7 days on and 7 days off from 01/26/2015- 03/18/2015.  Restaging CT imaging on 03/14/2015 demonstrated progressive disease with hepatic lesion resulting in an upstaging his disease to a STAGE IV status.  Now on systemic chemotherapy consisting of Cisplatin/Irinotecan/Cyramza beginning on 03/27/2015.  Oncology history is updated.  FULL CODE  Labs today do not meet treatment parameters secondary to neutropenia. Treatment will be deferred.  Given prolonged neutropenia and thrombocytopenia, I discussed the role of bone marrow aspiration and biopsy. Patient is agreeable to this and orders placed with IR for bone marrow aspiration and biopsy. I suspect this will be performed beginning of next week.  Bone marrow aspiration biopsy will be utilized to rule out infiltration of malignancy, dysplasia, and other primary bone marrow issues related to pancytopenia.  Treatment be deferred for approximately 2 weeks.   I refilled the patient's pain medication and this can be filled on 07/17/2015.  Labs at return appointment: CBC with differential, complete metabolic panel, magnesium.  Return in approximately 2 weeks for follow-up, review of bone marrow aspiration biopsy result, and consideration for continued therapy/modification of systemic chemotherapy.

## 2015-07-03 ENCOUNTER — Encounter (HOSPITAL_BASED_OUTPATIENT_CLINIC_OR_DEPARTMENT_OTHER): Payer: Medicaid Other

## 2015-07-03 ENCOUNTER — Encounter (HOSPITAL_COMMUNITY): Payer: Self-pay | Admitting: Oncology

## 2015-07-03 ENCOUNTER — Encounter (HOSPITAL_BASED_OUTPATIENT_CLINIC_OR_DEPARTMENT_OTHER): Payer: Medicaid Other | Admitting: Oncology

## 2015-07-03 VITALS — BP 151/71 | HR 60 | Temp 98.6°F | Resp 16 | Wt 152.0 lb

## 2015-07-03 DIAGNOSIS — C155 Malignant neoplasm of lower third of esophagus: Secondary | ICD-10-CM

## 2015-07-03 DIAGNOSIS — D696 Thrombocytopenia, unspecified: Secondary | ICD-10-CM

## 2015-07-03 DIAGNOSIS — D709 Neutropenia, unspecified: Secondary | ICD-10-CM | POA: Diagnosis not present

## 2015-07-03 DIAGNOSIS — C787 Secondary malignant neoplasm of liver and intrahepatic bile duct: Secondary | ICD-10-CM

## 2015-07-03 DIAGNOSIS — D61818 Other pancytopenia: Secondary | ICD-10-CM | POA: Diagnosis not present

## 2015-07-03 DIAGNOSIS — C159 Malignant neoplasm of esophagus, unspecified: Secondary | ICD-10-CM | POA: Diagnosis not present

## 2015-07-03 LAB — CBC WITH DIFFERENTIAL/PLATELET
BASOS PCT: 1 %
Basophils Absolute: 0 10*3/uL (ref 0.0–0.1)
EOS ABS: 0.1 10*3/uL (ref 0.0–0.7)
Eosinophils Relative: 6 %
HEMATOCRIT: 30.1 % — AB (ref 39.0–52.0)
HEMOGLOBIN: 9.8 g/dL — AB (ref 13.0–17.0)
LYMPHS ABS: 0.9 10*3/uL (ref 0.7–4.0)
Lymphocytes Relative: 39 %
MCH: 30.3 pg (ref 26.0–34.0)
MCHC: 32.6 g/dL (ref 30.0–36.0)
MCV: 93.2 fL (ref 78.0–100.0)
Monocytes Absolute: 0.4 10*3/uL (ref 0.1–1.0)
Monocytes Relative: 17 %
NEUTROS ABS: 0.8 10*3/uL — AB (ref 1.7–7.7)
NEUTROS PCT: 38 %
Platelets: 99 10*3/uL — ABNORMAL LOW (ref 150–400)
RBC: 3.23 MIL/uL — AB (ref 4.22–5.81)
RDW: 17 % — ABNORMAL HIGH (ref 11.5–15.5)
WBC: 2.2 10*3/uL — AB (ref 4.0–10.5)

## 2015-07-03 LAB — COMPREHENSIVE METABOLIC PANEL
ALBUMIN: 3.4 g/dL — AB (ref 3.5–5.0)
ALK PHOS: 92 U/L (ref 38–126)
ALT: 15 U/L — AB (ref 17–63)
AST: 23 U/L (ref 15–41)
Anion gap: 6 (ref 5–15)
BUN: 18 mg/dL (ref 6–20)
CALCIUM: 8.4 mg/dL — AB (ref 8.9–10.3)
CO2: 27 mmol/L (ref 22–32)
CREATININE: 1.19 mg/dL (ref 0.61–1.24)
Chloride: 104 mmol/L (ref 101–111)
GFR calc Af Amer: >60 mL/min (ref >60)
GFR calc non Af Amer: >60 mL/min (ref >60)
GLUCOSE: 135 mg/dL — AB (ref 65–99)
Potassium: 4.1 mmol/L (ref 3.5–5.1)
SODIUM: 137 mmol/L (ref 135–145)
Total Bilirubin: 0.3 mg/dL (ref 0.3–1.2)
Total Protein: 6.7 g/dL (ref 6.5–8.1)

## 2015-07-03 LAB — MAGNESIUM: Magnesium: 1.8 mg/dL (ref 1.7–2.4)

## 2015-07-03 MED ORDER — HEPARIN SOD (PORK) LOCK FLUSH 100 UNIT/ML IV SOLN
INTRAVENOUS | Status: AC
  Filled 2015-07-03: qty 5

## 2015-07-03 MED ORDER — HYDROCODONE-ACETAMINOPHEN 10-325 MG PO TABS: 1.0000 | ORAL_TABLET | ORAL | Status: DC | PRN

## 2015-07-03 MED ORDER — HEPARIN SOD (PORK) LOCK FLUSH 100 UNIT/ML IV SOLN
500.0000 [IU] | Freq: Once | INTRAVENOUS | Status: AC
  Administered 2015-07-03: 500 [IU] via INTRAVENOUS

## 2015-07-03 MED ORDER — POTASSIUM CHLORIDE 2 MEQ/ML IV SOLN
Freq: Once | INTRAVENOUS | Status: DC
  Filled 2015-07-03: qty 10

## 2015-07-03 MED ORDER — SODIUM CHLORIDE 0.9 % IV SOLN: Freq: Once | INTRAVENOUS | Status: DC

## 2015-07-03 NOTE — Patient Instructions (Signed)
[facility] at [facility] Discharge Instructions  RECOMMENDATIONS MADE BY THE CONSULTANT AND ANY TEST RESULTS WILL BE SENT TO YOUR REFERRING PHYSICIAN.  Exam and discussion today with Tom Kefalas, PA. No treatment today. Bone marrow aspiration and biopsy next week with Interventional Radiology. Prescription for pain medication given. Return for office visit in 2 weeks - possible treatment at that time depending on results of bone marrow biopsy.   Thank you for choosing [facility] at [facility] to provide your oncology and hematology care.  To afford each patient quality time with our provider, please arrive at least 15 minutes before your scheduled appointment time.   Beginning January 23rd 2017 lab work for the Cancer Center will be done in the  Main lab at [facility] on 1st floor. If you have a lab appointment with the Cancer Center please come in thru the  Main Entrance and check in at the main information desk  You need to re-schedule your appointment should you arrive 10 or more minutes late.  We strive to give you quality time with our providers, and arriving late affects you and other patients whose appointments are after yours.  Also, if you no show three or more times for appointments you may be dismissed from the clinic at the providers discretion.     Again, thank you for choosing [facility].  Our hope is that these requests will decrease the amount of time that you wait before being seen by our physicians.       _____________________________________________________________  Should you have questions after your visit to [facility], please contact our office at [phone] between the hours of 8:30 a.m. and 4:30 p.m.  Voicemails left after 4:30 p.m. will not be returned until the following business day.  For prescription refill requests, have your pharmacy contact our office.         Resources For  Cancer Patients and their Caregivers ? American Cancer Society: Can assist with transportation, wigs, general needs, runs Look Good Feel Better.        1-888-227-6333 ? Cancer Care: Provides financial assistance, online support groups, medication/co-pay assistance.  1-800-813-HOPE (4673) ? [facility] Assists [place] cancer patients and their families through emotional , educational and financial support.  336-427-4357 ? [place] DSS Where to apply for food stamps, Medicaid and utility assistance. 336-342-1394 ? RCATS: Transportation to medical appointments. 336-347-2287 ? Social Security Administration: May apply for disability if have a Stage IV cancer. 336-342-7796 1-800-772-1213 ? [place] Aging, Disability and Transit Services: Assists with nutrition, care and transit needs. 336-349-2343  Cancer Center Support Programs: @10RELATIVEDAYS@ > Cancer Support Group  2nd Tuesday of the month 1pm-2pm, Journey Room  > Creative Journey  3rd Tuesday of the month 1130am-1pm, Journey Room  > Look Good Feel Better  1st Wednesday of the month 10am-12 noon, Journey Room (Call American Cancer [employer] to register 1-800-395-5775)   

## 2015-07-03 NOTE — Patient Instructions (Signed)
[place] at [facility] Discharge Instructions  RECOMMENDATIONS MADE BY THE CONSULTANT AND ANY TEST RESULTS WILL BE SENT TO YOUR REFERRING PHYSICIAN.  Defer chemotherapy  Return as scheduled   Thank you for choosing [place] at [facility] to provide your oncology and hematology care.  To afford each patient quality time with our provider, please arrive at least 15 minutes before your scheduled appointment time.   Beginning January 23rd 2017 lab work for the [facility] will be done in the  Main lab at [facility] on 1st floor. If you have a lab appointment with the [facility] please come in thru the  Main Entrance and check in at the main information desk  You need to re-schedule your appointment should you arrive 10 or more minutes late.  We strive to give you quality time with our providers, and arriving late affects you and other patients whose appointments are after yours.  Also, if you no show three or more times for appointments you may be dismissed from the clinic at the providers discretion.     Again, thank you for choosing [facility].  Our hope is that these requests will decrease the amount of time that you wait before being seen by our physicians.       _____________________________________________________________  Should you have questions after your visit to [facility], please contact our office at [phone] between the hours of 8:30 a.m. and 4:30 p.m.  Voicemails left after 4:30 p.m. will not be returned until the following business day.  For prescription refill requests, have your pharmacy contact our office.         Resources For Cancer Patients and their Caregivers ? American Cancer Society: Can assist with transportation, wigs, general needs, runs Look Good Feel Better.        984-645-0798 ? Cancer Care: Provides financial assistance, online support groups, medication/co-pay  assistance.  1-800-813-HOPE 414-772-0878) ? [facility] Assists [place] cancer patients and their families through emotional , educational and financial support.  4197378108 ? [place] DSS Where to apply for food stamps, Medicaid and utility assistance. 401-116-7813 ? RCATS: Transportation to medical appointments. 440-102-7448 ? Social Security Administration: May apply for disability if have a Stage IV cancer. (440)127-2583 (413)033-5778 ? [employer], Disability and Transit Services: Assists with nutrition, care and transit needs. [place] Support Programs: '@10RELATIVEDAYS'$ @ > Cancer Support Group  2nd Tuesday of the month 1pm-2pm, Journey Room  > Creative Journey  3rd Tuesday of the month 1130am-1pm, Journey Room  > Look Good Feel Better  1st Wednesday of the month 10am-12 noon, Journey Room (Call [place] to register [phone])

## 2015-07-03 NOTE — Progress Notes (Signed)
Deferring chemotherapy

## 2015-07-07 ENCOUNTER — Other Ambulatory Visit: Payer: Self-pay | Admitting: General Surgery

## 2015-07-07 ENCOUNTER — Other Ambulatory Visit: Payer: Self-pay | Admitting: Radiology

## 2015-07-08 ENCOUNTER — Encounter (HOSPITAL_COMMUNITY): Payer: Self-pay

## 2015-07-08 ENCOUNTER — Ambulatory Visit (HOSPITAL_COMMUNITY)
Admission: RE | Admit: 2015-07-08 | Discharge: 2015-07-08 | Disposition: A | Discharge status: Home / Self Care | Payer: Medicaid Other | Source: Ambulatory Visit | Attending: Oncology | Admitting: Oncology

## 2015-07-08 DIAGNOSIS — D649 Anemia, unspecified: Secondary | ICD-10-CM | POA: Diagnosis not present

## 2015-07-08 DIAGNOSIS — Z8501 Personal history of malignant neoplasm of esophagus: Secondary | ICD-10-CM | POA: Diagnosis not present

## 2015-07-08 DIAGNOSIS — Z801 Family history of malignant neoplasm of trachea, bronchus and lung: Secondary | ICD-10-CM | POA: Diagnosis not present

## 2015-07-08 DIAGNOSIS — Z833 Family history of diabetes mellitus: Secondary | ICD-10-CM | POA: Insufficient documentation

## 2015-07-08 DIAGNOSIS — R942 Abnormal results of pulmonary function studies: Secondary | ICD-10-CM | POA: Diagnosis not present

## 2015-07-08 DIAGNOSIS — Z87891 Personal history of nicotine dependence: Secondary | ICD-10-CM | POA: Diagnosis not present

## 2015-07-08 DIAGNOSIS — D696 Thrombocytopenia, unspecified: Secondary | ICD-10-CM | POA: Diagnosis not present

## 2015-07-08 DIAGNOSIS — Z823 Family history of stroke: Secondary | ICD-10-CM | POA: Insufficient documentation

## 2015-07-08 DIAGNOSIS — Z809 Family history of malignant neoplasm, unspecified: Secondary | ICD-10-CM | POA: Diagnosis not present

## 2015-07-08 DIAGNOSIS — D72822 Plasmacytosis: Secondary | ICD-10-CM | POA: Diagnosis not present

## 2015-07-08 DIAGNOSIS — D61818 Other pancytopenia: Secondary | ICD-10-CM | POA: Diagnosis present

## 2015-07-08 DIAGNOSIS — Z7982 Long term (current) use of aspirin: Secondary | ICD-10-CM | POA: Diagnosis not present

## 2015-07-08 DIAGNOSIS — Z9221 Personal history of antineoplastic chemotherapy: Secondary | ICD-10-CM | POA: Diagnosis not present

## 2015-07-08 DIAGNOSIS — Z923 Personal history of irradiation: Secondary | ICD-10-CM | POA: Diagnosis not present

## 2015-07-08 LAB — BONE MARROW EXAM

## 2015-07-08 LAB — CBC WITH DIFFERENTIAL/PLATELET
Basophils Absolute: 0 10*3/uL (ref 0.0–0.1)
Basophils Relative: 0 %
Eosinophils Absolute: 0.1 10*3/uL (ref 0.0–0.7)
Eosinophils Relative: 4 %
HEMATOCRIT: 31.4 % — AB (ref 39.0–52.0)
HEMOGLOBIN: 10 g/dL — AB (ref 13.0–17.0)
Lymphocytes Relative: 23 %
Lymphs Abs: 0.7 10*3/uL (ref 0.7–4.0)
MCH: 29.2 pg (ref 26.0–34.0)
MCHC: 31.8 g/dL (ref 30.0–36.0)
MCV: 91.8 fL (ref 78.0–100.0)
MONO ABS: 0.7 10*3/uL (ref 0.1–1.0)
MONOS PCT: 22 %
NEUTROS ABS: 1.6 10*3/uL — AB (ref 1.7–7.7)
Neutrophils Relative %: 51 %
Platelets: 139 10*3/uL — ABNORMAL LOW (ref 150–400)
RBC: 3.42 MIL/uL — ABNORMAL LOW (ref 4.22–5.81)
RDW: 16.6 % — AB (ref 11.5–15.5)
WBC: 3.2 10*3/uL — ABNORMAL LOW (ref 4.0–10.5)

## 2015-07-08 LAB — PROTIME-INR
INR: 1.2 (ref 0.00–1.49)
Prothrombin Time: 14.9 seconds (ref 11.6–15.2)

## 2015-07-08 MED ORDER — HEPARIN SOD (PORK) LOCK FLUSH 100 UNIT/ML IV SOLN
500.0000 [IU] | INTRAVENOUS | Status: AC | PRN
  Administered 2015-07-08: 500 [IU]
  Filled 2015-07-08: qty 5

## 2015-07-08 MED ORDER — MIDAZOLAM HCL 2 MG/2ML IJ SOLN
INTRAMUSCULAR | Status: AC
  Filled 2015-07-08: qty 4

## 2015-07-08 MED ORDER — FENTANYL CITRATE (PF) 100 MCG/2ML IJ SOLN
INTRAMUSCULAR | Status: AC | PRN
  Administered 2015-07-08 (×2): 25 ug via INTRAVENOUS

## 2015-07-08 MED ORDER — MIDAZOLAM HCL 2 MG/2ML IJ SOLN
INTRAMUSCULAR | Status: AC | PRN
  Administered 2015-07-08: 0.5 mg via INTRAVENOUS
  Administered 2015-07-08: 1 mg via INTRAVENOUS

## 2015-07-08 MED ORDER — SODIUM CHLORIDE 0.9 % IV SOLN
INTRAVENOUS | Status: DC
  Administered 2015-07-08: 08:00:00 via INTRAVENOUS

## 2015-07-08 MED ORDER — FENTANYL CITRATE (PF) 100 MCG/2ML IJ SOLN
INTRAMUSCULAR | Status: AC
  Filled 2015-07-08: qty 2

## 2015-07-08 NOTE — Sedation Documentation (Signed)
Patient denies pain and is resting comfortably.  

## 2015-07-08 NOTE — Discharge Instructions (Signed)
Moderate Conscious Sedation, Adult, Care After °Refer to this sheet in the next few weeks. These instructions provide you with information on caring for yourself after your procedure. Your health care provider may also give you more specific instructions. Your treatment has been planned according to current medical practices, but problems sometimes occur. Call your health care provider if you have any problems or questions after your procedure. °WHAT TO EXPECT AFTER THE PROCEDURE  °After your procedure: °· You may feel sleepy, clumsy, and have poor balance for several hours. °· Vomiting may occur if you eat too soon after the procedure. °HOME CARE INSTRUCTIONS °· Do not participate in any activities where you could become injured for at least 24 hours. Do not: °¨ Drive. °¨ Swim. °¨ Ride a bicycle. °¨ Operate heavy machinery. °¨ Cook. °¨ Use power tools. °¨ Climb ladders. °¨ Work from a high place. °· Do not make important decisions or sign legal documents until you are improved. °· If you vomit, drink water, juice, or soup when you can drink without vomiting. Make sure you have little or no nausea before eating solid foods. °· Only take over-the-counter or prescription medicines for pain, discomfort, or fever as directed by your health care provider. °· Make sure you and your family fully understand everything about the medicines given to you, including what side effects may occur. °· You should not drink alcohol, take sleeping pills, or take medicines that cause drowsiness for at least 24 hours. °· If you smoke, do not smoke without supervision. °· If you are feeling better, you may resume normal activities 24 hours after you were sedated. °· Keep all appointments with your health care provider. °SEEK MEDICAL CARE IF: °· Your skin is pale or bluish in color. °· You continue to feel nauseous or vomit. °· Your pain is getting worse and is not helped by medicine. °· You have bleeding or swelling. °· You are still  sleepy or feeling clumsy after 24 hours. °SEEK IMMEDIATE MEDICAL CARE IF: °· You develop a rash. °· You have difficulty breathing. °· You develop any type of allergic problem. °· You have a fever. °MAKE SURE YOU: °· Understand these instructions. °· Will watch your condition. °· Will get help right away if you are not doing well or get worse. °  °This information is not intended to replace advice given to you by your health care provider. Make sure you discuss any questions you have with your health care provider. °  °Document Released: 11/22/2012 Document Revised: 02/22/2014 Document Reviewed: 11/22/2012 °Elsevier Interactive Patient Education ©2016 Elsevier Inc. °Bone Marrow Aspiration and Bone Marrow Biopsy, Care After °Refer to this sheet in the next few weeks. These instructions provide you with information about caring for yourself after your procedure. Your health care provider may also give you more specific instructions. Your treatment has been planned according to current medical practices, but problems sometimes occur. Call your health care provider if you have any problems or questions after your procedure. °WHAT TO EXPECT AFTER THE PROCEDURE °After your procedure, it is common to have: °· Soreness or tenderness around the puncture site. °· Bruising. °HOME CARE INSTRUCTIONS °· Take medicines only as directed by your health care provider. °· Follow your health care provider's instructions about: °¨ Puncture site care. °¨ Bandage (dressing) changes and removal. °· Bathe and shower as directed by your health care provider. °· Check your puncture site every day for signs of infection. Watch for: °¨ Redness, swelling, or pain. °¨   Fluid, blood, or pus. °· Return to your normal activities as directed by your health care provider. °· Keep all follow-up visits as directed by your health care provider. This is important. °SEEK MEDICAL CARE IF: °· You have a fever. °· You have uncontrollable bleeding. °· You have  redness, swelling, or pain at the site of your puncture. °· You have fluid, blood, or pus coming from your puncture site. °  °This information is not intended to replace advice given to you by your health care provider. Make sure you discuss any questions you have with your health care provider. °  °Document Released: 08/21/2004 Document Revised: 06/18/2014 Document Reviewed: 01/23/2014 °Elsevier Interactive Patient Education ©2016 Elsevier Inc. ° °

## 2015-07-08 NOTE — Procedures (Signed)
Interventional Radiology Procedure Note  Procedure: CT guided aspirate and core biopsy of right iliac bone Complications: None Recommendations: - Bedrest supine x 1 hrs - OTC PRN  Pain - Follow biopsy results  Signed,  Dulcy Fanny. Earleen Newport, DO
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
## 2015-07-10 ENCOUNTER — Inpatient Hospital Stay (HOSPITAL_COMMUNITY): Payer: Medicaid Other

## 2015-07-18 ENCOUNTER — Encounter: Payer: Self-pay | Admitting: Cardiovascular Disease

## 2015-07-18 ENCOUNTER — Other Ambulatory Visit: Payer: Self-pay

## 2015-07-18 ENCOUNTER — Encounter (HOSPITAL_COMMUNITY): Payer: Medicaid Other | Attending: Hematology & Oncology | Admitting: Hematology & Oncology

## 2015-07-18 ENCOUNTER — Inpatient Hospital Stay (HOSPITAL_COMMUNITY): Payer: Medicaid Other

## 2015-07-18 VITALS — BP 147/78 | HR 74 | Temp 98.7°F | Resp 18 | Wt 148.0 lb

## 2015-07-18 DIAGNOSIS — C155 Malignant neoplasm of lower third of esophagus: Secondary | ICD-10-CM

## 2015-07-18 DIAGNOSIS — C787 Secondary malignant neoplasm of liver and intrahepatic bile duct: Secondary | ICD-10-CM | POA: Diagnosis not present

## 2015-07-18 DIAGNOSIS — C159 Malignant neoplasm of esophagus, unspecified: Secondary | ICD-10-CM | POA: Insufficient documentation

## 2015-07-18 DIAGNOSIS — D696 Thrombocytopenia, unspecified: Secondary | ICD-10-CM | POA: Diagnosis not present

## 2015-07-18 DIAGNOSIS — D61810 Antineoplastic chemotherapy induced pancytopenia: Secondary | ICD-10-CM | POA: Diagnosis not present

## 2015-07-18 DIAGNOSIS — R918 Other nonspecific abnormal finding of lung field: Secondary | ICD-10-CM

## 2015-07-18 DIAGNOSIS — D709 Neutropenia, unspecified: Secondary | ICD-10-CM

## 2015-07-18 DIAGNOSIS — I499 Cardiac arrhythmia, unspecified: Secondary | ICD-10-CM | POA: Diagnosis not present

## 2015-07-18 DIAGNOSIS — D61818 Other pancytopenia: Secondary | ICD-10-CM

## 2015-07-18 DIAGNOSIS — I491 Atrial premature depolarization: Secondary | ICD-10-CM

## 2015-07-18 MED ORDER — OXYCODONE HCL 10 MG PO TABS: 10.0000 mg | ORAL_TABLET | ORAL | Status: DC | PRN

## 2015-07-18 NOTE — Patient Instructions (Signed)
[place] at [facility] Discharge Instructions  RECOMMENDATIONS MADE BY THE CONSULTANT AND ANY TEST RESULTS WILL BE SENT TO YOUR REFERRING PHYSICIAN.  ECHO next week  CT in July - return to clinic after CT with Dr. [name]  EKG today    Thank you for choosing [place] at [facility] to provide your oncology and hematology care.  To afford each patient quality time with our provider, please arrive at least 15 minutes before your scheduled appointment time.   Beginning January 23rd 2017 lab work for the [facility] will be done in the  Main lab at [facility] on 1st floor. If you have a lab appointment with the [place] please come in thru the  Main Entrance and check in at the main information desk  You need to re-schedule your appointment should you arrive 10 or more minutes late.  We strive to give you quality time with our providers, and arriving late affects you and other patients whose appointments are after yours.  Also, if you no show three or more times for appointments you may be dismissed from the clinic at the providers discretion.     Again, thank you for choosing [facility].  Our hope is that these requests will decrease the amount of time that you wait before being seen by our physicians.       _____________________________________________________________  Should you have questions after your visit to [facility], please contact our office at [phone] between the hours of 8:30 a.m. and 4:30 p.m.  Voicemails left after 4:30 p.m. will not be returned until the following business day.  For prescription refill requests, have your pharmacy contact our office.         Resources For Cancer Patients and their Caregivers ? American Cancer Society: Can assist with transportation, wigs, general needs, runs Look Good Feel Better.        518-195-7902 ? Cancer Care: Provides financial  assistance, online support groups, medication/co-pay assistance.  1-800-813-HOPE 986-710-2625) ? [facility] Assists [place] cancer patients and their families through emotional , educational and financial support.  ([phone] ? [place] DSS Where to apply for food stamps, Medicaid and utility assistance. [phone] ? RCATS: Transportation to medical appointments. ([phone] 038-2928 ? Social Security Administration: May apply for disability if have a Stage IV cancer. [phone] ? [employer], Disability and Transit Services: Assists with nutrition, care and transit needs. [place] Support Programs: '@10RELATIVEDAYS'$ @ > Cancer Support Group  2nd Tuesday of the month 1pm-2pm, Journey Room  > Creative Journey  3rd Tuesday of the month 1130am-1pm, Journey Room  > Look Good Feel Better  1st Wednesday of the month 10am-12 noon, Journey Room (Call [place] to register [phone])
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
## 2015-07-21 ENCOUNTER — Encounter (HOSPITAL_COMMUNITY): Payer: Self-pay | Admitting: Hematology & Oncology

## 2015-07-23 ENCOUNTER — Ambulatory Visit (HOSPITAL_COMMUNITY)
Admission: RE | Admit: 2015-07-23 | Discharge: 2015-07-23 | Disposition: A | Discharge status: Home / Self Care | Payer: Medicaid Other | Source: Ambulatory Visit | Attending: Hematology & Oncology | Admitting: Hematology & Oncology

## 2015-07-23 DIAGNOSIS — R012 Other cardiac sounds: Secondary | ICD-10-CM | POA: Diagnosis present

## 2015-07-23 DIAGNOSIS — I071 Rheumatic tricuspid insufficiency: Secondary | ICD-10-CM | POA: Diagnosis not present

## 2015-07-23 DIAGNOSIS — I119 Hypertensive heart disease without heart failure: Secondary | ICD-10-CM | POA: Insufficient documentation

## 2015-07-23 DIAGNOSIS — I340 Nonrheumatic mitral (valve) insufficiency: Secondary | ICD-10-CM | POA: Insufficient documentation

## 2015-07-23 DIAGNOSIS — I491 Atrial premature depolarization: Secondary | ICD-10-CM

## 2015-07-23 DIAGNOSIS — I352 Nonrheumatic aortic (valve) stenosis with insufficiency: Secondary | ICD-10-CM | POA: Diagnosis not present

## 2015-07-23 DIAGNOSIS — I499 Cardiac arrhythmia, unspecified: Secondary | ICD-10-CM | POA: Diagnosis not present

## 2015-07-23 LAB — ECHOCARDIOGRAM COMPLETE
AOVTI: 53.1 cm
AV Area mean vel: 2.06 cm2
AV VEL mean LVOT/AV: 0.54
AV peak Index: 1.12
AV vel: 2.15
AVA: 2.15 cm2
AVAREAMEANVIN: 1.1 cm2/m2
AVAREAVTI: 2.1 cm2
AVAREAVTIIND: 1.15 cm2/m2
AVG: 12 mmHg
AVLVOTPG: 7 mmHg
AVPG: 22 mmHg
AVPHT: 554 ms
AVPKVEL: 237 cm/s
Ao pk vel: 0.55 m/s
CHL CUP AV VALUE AREA INDEX: 1.15
CHL CUP DOP CALC LVOT VTI: 30.1 cm
CHL CUP STROKE VOLUME: 49 mL
DOP CAL AO MEAN VELOCITY: 160 cm/s
EERAT: 7.74
EWDT: 310 ms
FS: 33 % (ref 28–44)
IVS/LV PW RATIO, ED: 1.03
LA ID, A-P, ES: 36 mm
LA diam index: 1.93 cm/m2
LA vol A4C: 92.5 ml
LA vol index: 52.4 mL/m2
LAVOL: 97.9 mL
LEFT ATRIUM END SYS DIAM: 36 mm
LV E/e'average: 7.74
LV PW d: 12.6 mm — AB (ref 0.6–1.1)
LV SIMPSON'S DISK: 58
LV TDI E'LATERAL: 7.07
LV e' LATERAL: 7.07 cm/s
LV sys vol: 35 mL (ref 21–61)
LVDIAVOL: 83 mL (ref 62–150)
LVDIAVOLIN: 45 mL/m2
LVEEMED: 7.74
LVOT area: 3.8 cm2
LVOT diameter: 22 mm
LVOT peak VTI: 0.57 cm
LVOTPV: 131 cm/s
LVOTSV: 114 mL
LVSYSVOLIN: 19 mL/m2
MV Dec: 310
MV pk A vel: 55.2 m/s
MV pk E vel: 54.7 m/s
RV TAPSE: 25.6 mm
Reg peak vel: 308 cm/s
TDI e' medial: 6.74
TR max vel: 308 cm/s
VTI: 145 cm

## 2015-07-23 LAB — CHROMOSOME ANALYSIS, BONE MARROW

## 2015-07-24 ENCOUNTER — Telehealth: Payer: Self-pay | Admitting: Cardiovascular Disease

## 2015-07-24 NOTE — Telephone Encounter (Signed)
Pt's family asked to call Dr Donald Pore office for echo results as she is the ordering MD

## 2015-07-24 NOTE — Telephone Encounter (Signed)
pls call concerning Echo results
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
## 2015-07-30 ENCOUNTER — Ambulatory Visit (INDEPENDENT_AMBULATORY_CARE_PROVIDER_SITE_OTHER): Payer: Medicaid Other | Admitting: Cardiovascular Disease

## 2015-07-30 ENCOUNTER — Encounter: Payer: Self-pay | Admitting: Cardiovascular Disease

## 2015-07-30 VITALS — BP 102/52 | HR 65 | Ht 73.0 in | Wt 143.0 lb

## 2015-07-30 DIAGNOSIS — I359 Nonrheumatic aortic valve disorder, unspecified: Secondary | ICD-10-CM | POA: Diagnosis not present

## 2015-07-30 NOTE — Patient Instructions (Signed)
Your physician recommends that you schedule a follow-up appointment in: As Needed  Your physician recommends that you continue on your current medications as directed. Please refer to the Current Medication list given to you today.  Thank you for choosing [facility]!

## 2015-08-12 ENCOUNTER — Other Ambulatory Visit (HOSPITAL_COMMUNITY): Payer: Self-pay | Admitting: Oncology

## 2015-08-12 DIAGNOSIS — C155 Malignant neoplasm of lower third of esophagus: Secondary | ICD-10-CM

## 2015-08-12 MED ORDER — OXYCODONE HCL 10 MG PO TABS: 10.0000 mg | ORAL_TABLET | ORAL | Status: DC | PRN

## 2015-08-14 ENCOUNTER — Encounter (HOSPITAL_COMMUNITY): Payer: Self-pay

## 2015-08-20 ENCOUNTER — Other Ambulatory Visit (HOSPITAL_COMMUNITY): Payer: Self-pay | Admitting: Oncology

## 2015-08-20 DIAGNOSIS — C155 Malignant neoplasm of lower third of esophagus: Secondary | ICD-10-CM

## 2015-08-20 MED ORDER — MORPHINE SULFATE ER 15 MG PO TBCR: 15.0000 mg | EXTENDED_RELEASE_TABLET | Freq: Two times a day (BID) | ORAL | Status: DC

## 2015-08-20 MED ORDER — HYDROCODONE-ACETAMINOPHEN 10-325 MG PO TABS: 1.0000 | ORAL_TABLET | ORAL | Status: DC | PRN

## 2015-08-26 ENCOUNTER — Ambulatory Visit (HOSPITAL_COMMUNITY): Payer: Medicaid Other

## 2015-08-28 ENCOUNTER — Ambulatory Visit (HOSPITAL_COMMUNITY): Payer: Medicaid Other | Admitting: Hematology & Oncology

## 2015-09-01 ENCOUNTER — Other Ambulatory Visit: Payer: Self-pay | Admitting: Cardiovascular Disease

## 2015-09-10 ENCOUNTER — Other Ambulatory Visit (HOSPITAL_COMMUNITY): Payer: Self-pay | Admitting: *Deleted

## 2015-09-10 ENCOUNTER — Other Ambulatory Visit (HOSPITAL_COMMUNITY): Payer: Self-pay | Admitting: Hematology & Oncology

## 2015-09-10 DIAGNOSIS — C155 Malignant neoplasm of lower third of esophagus: Secondary | ICD-10-CM

## 2015-09-10 MED ORDER — HYDROCODONE-ACETAMINOPHEN 10-325 MG PO TABS: 1.0000 | ORAL_TABLET | ORAL | 0 refills | Status: DC | PRN

## 2015-09-16 ENCOUNTER — Encounter (HOSPITAL_COMMUNITY): Payer: Medicaid Other | Attending: Oncology | Admitting: Oncology

## 2015-09-16 ENCOUNTER — Encounter (HOSPITAL_COMMUNITY): Payer: Medicaid Other

## 2015-09-16 VITALS — BP 108/55 | HR 56 | Temp 98.6°F | Resp 18 | Wt 143.0 lb

## 2015-09-16 DIAGNOSIS — C155 Malignant neoplasm of lower third of esophagus: Secondary | ICD-10-CM

## 2015-09-16 DIAGNOSIS — Z95828 Presence of other vascular implants and grafts: Secondary | ICD-10-CM

## 2015-09-16 DIAGNOSIS — C787 Secondary malignant neoplasm of liver and intrahepatic bile duct: Secondary | ICD-10-CM | POA: Diagnosis not present

## 2015-09-16 DIAGNOSIS — C159 Malignant neoplasm of esophagus, unspecified: Secondary | ICD-10-CM | POA: Insufficient documentation

## 2015-09-16 LAB — COMPREHENSIVE METABOLIC PANEL
ALBUMIN: 3.6 g/dL (ref 3.5–5.0)
ALK PHOS: 92 U/L (ref 38–126)
ALT: 13 U/L — AB (ref 17–63)
AST: 25 U/L (ref 15–41)
Anion gap: 3 — ABNORMAL LOW (ref 5–15)
BILIRUBIN TOTAL: 0.3 mg/dL (ref 0.3–1.2)
BUN: 15 mg/dL (ref 6–20)
CO2: 29 mmol/L (ref 22–32)
CREATININE: 0.99 mg/dL (ref 0.61–1.24)
Calcium: 8.8 mg/dL — ABNORMAL LOW (ref 8.9–10.3)
Chloride: 104 mmol/L (ref 101–111)
GFR calc Af Amer: >60 mL/min (ref >60)
GFR calc non Af Amer: >60 mL/min (ref >60)
GLUCOSE: 72 mg/dL (ref 65–99)
POTASSIUM: 3.9 mmol/L (ref 3.5–5.1)
Sodium: 136 mmol/L (ref 135–145)
TOTAL PROTEIN: 7.7 g/dL (ref 6.5–8.1)

## 2015-09-16 LAB — CBC WITH DIFFERENTIAL/PLATELET
BASOS ABS: 0 10*3/uL (ref 0.0–0.1)
BASOS PCT: 0 %
Eosinophils Absolute: 0.1 10*3/uL (ref 0.0–0.7)
Eosinophils Relative: 3 %
HEMATOCRIT: 31.7 % — AB (ref 39.0–52.0)
HEMOGLOBIN: 10.1 g/dL — AB (ref 13.0–17.0)
LYMPHS PCT: 20 %
Lymphs Abs: 0.8 10*3/uL (ref 0.7–4.0)
MCH: 28.5 pg (ref 26.0–34.0)
MCHC: 31.9 g/dL (ref 30.0–36.0)
MCV: 89.5 fL (ref 78.0–100.0)
Monocytes Absolute: 0.6 10*3/uL (ref 0.1–1.0)
Monocytes Relative: 14 %
NEUTROS ABS: 2.4 10*3/uL (ref 1.7–7.7)
NEUTROS PCT: 63 %
Platelets: 173 10*3/uL (ref 150–400)
RBC: 3.54 MIL/uL — AB (ref 4.22–5.81)
RDW: 15.4 % (ref 11.5–15.5)
WBC: 3.9 10*3/uL — AB (ref 4.0–10.5)

## 2015-09-16 MED ORDER — HEPARIN SOD (PORK) LOCK FLUSH 100 UNIT/ML IV SOLN
500.0000 [IU] | Freq: Once | INTRAVENOUS | Status: AC
  Administered 2015-09-16: 500 [IU] via INTRAVENOUS
  Filled 2015-09-16: qty 5

## 2015-09-16 MED ORDER — OXYCODONE HCL 10 MG PO TABS: 10.0000 mg | ORAL_TABLET | ORAL | 0 refills | Status: DC | PRN

## 2015-09-16 MED ORDER — MORPHINE SULFATE ER 15 MG PO TBCR: 15.0000 mg | EXTENDED_RELEASE_TABLET | Freq: Two times a day (BID) | ORAL | 0 refills | Status: AC

## 2015-09-16 MED ORDER — SODIUM CHLORIDE 0.9% FLUSH
10.0000 mL | Freq: Once | INTRAVENOUS | Status: AC
  Administered 2015-09-16: 10 mL via INTRAVENOUS
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
## 2015-09-16 NOTE — Assessment & Plan Note (Signed)
Initially, stage IIIA esophageal carcinoma, squamous cell, HER2 NEGATIVE.   He is S/P weekly carboplatin/paclitaxel with radiation therapy finishing in June 2016 in the curative setting.  Unfortunately, PET scan demonstrated a hypermetabolic superior mediastinal paraesophageal lesion, thereby precluding surgical intervention.  As a result, he went back on treatment with Xeloda + XRT.  He completed XRT on 12/12/2014.  He continued single-agent Xeloda 1500 mg BID 7 days on and 7 days off from 01/26/2015- 03/18/2015.  Restaging CT imaging on 03/14/2015 demonstrated progressive disease with hepatic lesion resulting in an upstaging his disease to a STAGE IV status.  Now on weekly Cisplatin/Irinotecan beginning on 03/27/2015 with the addition of Cyramza beginning on 05/15/2015 with discontinuation of treatment on 06/06/2015 due to issues with blood counts.  Oncology history is updated.  FULL CODE  Labs today: CBC diff, CMET.  I personally reviewed and went over laboratory results with the patient.  The results are noted within this dictation.  For some reason, [name] did not undergo restaging imaging as planned.  As a result, we will get this rescheduled for him.  Labs in ~ 3 weeks: CBC diff, CMET.   Orders are in for CT CAP with contrast.  Based upon insurance approvals in the past, we may need to consider performing a CT chest and abdomen.  Pelvis may not be approved.  I refilled the patient's pain medications as well.  Return in ~ 3 weeks following imaging to discuss medical oncology recommendations. 

## 2015-09-16 NOTE — Progress Notes (Signed)
Anthony Cameron presented for Portacath access and flush. Proper placement of portacath confirmed by CXR. Portacath located right chest wall accessed with  H 20 needle. Good blood return present. Portacath flushed with 77m NS and 500U/531mHeparin and needle removed intact. Procedure without incident. Patient tolerated procedure well.

## 2015-09-16 NOTE — Patient Instructions (Signed)
[place] at [facility] Discharge Instructions  RECOMMENDATIONS MADE BY THE CONSULTANT AND ANY TEST RESULTS WILL BE SENT TO YOUR REFERRING PHYSICIAN.  You were seen by Dr. [name] today. Pain med prescription given Labs in 2 and 4 weeks  CT  Scans in 2 and 3 weeks  Return to clinic after scans.  Thank you for choosing [place] at [facility] to provide your oncology and hematology care.  To afford each patient quality time with our provider, please arrive at least 15 minutes before your scheduled appointment time.   Beginning January 23rd 2017 lab work for the [facility] will be done in the  Main lab at [facility] on 1st floor. If you have a lab appointment with the [facility] please come in thru the  Main Entrance and check in at the main information desk  You need to re-schedule your appointment should you arrive 10 or more minutes late.  We strive to give you quality time with our providers, and arriving late affects you and other patients whose appointments are after yours.  Also, if you no show three or more times for appointments you may be dismissed from the clinic at the providers discretion.     Again, thank you for choosing [facility].  Our hope is that these requests will decrease the amount of time that you wait before being seen by our physicians.       _____________________________________________________________  Should you have questions after your visit to [facility], please contact our office at [phone] between the hours of 8:30 a.m. and 4:30 p.m.  Voicemails left after 4:30 p.m. will not be returned until the following business day.  For prescription refill requests, have your pharmacy contact our office.         Resources For Cancer Patients and their Caregivers ? American Cancer Society: Can assist with transportation, wigs, general needs, runs Look Good Feel Better.         7192951394 ? Cancer Care: Provides financial assistance, online support groups, medication/co-pay assistance.  1-800-813-HOPE (607)159-5164) ? [place] Assists [place] cancer patients and their families through emotional , educational and financial support.  778-821-2153 ? [place] DSS Where to apply for food stamps, Medicaid and utility assistance. (684) 075-0704 ? [employer]: Transportation to medical appointments. 808-868-9664 ? Social Security Administration: May apply for disability if have a Stage IV cancer. 272-179-8134 782-212-2984 ? [employer], Disability and Transit Services: Assists with nutrition, care and transit needs. [place] Support Programs: '@10RELATIVEDAYS'$ @ > Cancer Support Group  2nd Tuesday of the month 1pm-2pm, Journey Room  > Creative Journey  3rd Tuesday of the month 1130am-1pm, Journey Room  > Look Good Feel Better  1st Wednesday of the month 10am-12 noon, Journey Room (Call [place] to register [phone])

## 2015-09-16 NOTE — Progress Notes (Signed)
See office visit progress note.

## 2015-09-24 ENCOUNTER — Other Ambulatory Visit: Payer: Self-pay

## 2015-09-24 ENCOUNTER — Other Ambulatory Visit: Payer: Self-pay | Admitting: Cardiovascular Disease

## 2015-09-24 ENCOUNTER — Emergency Department (HOSPITAL_COMMUNITY): Payer: Medicaid Other

## 2015-09-24 ENCOUNTER — Emergency Department (HOSPITAL_COMMUNITY)
Admission: EM | Admit: 2015-09-24 | Discharge: 2015-09-24 | Disposition: A | Discharge status: Home / Self Care | Payer: Medicaid Other | Attending: Emergency Medicine | Admitting: Emergency Medicine

## 2015-09-24 ENCOUNTER — Encounter (HOSPITAL_COMMUNITY): Payer: Self-pay | Admitting: *Deleted

## 2015-09-24 DIAGNOSIS — Z87891 Personal history of nicotine dependence: Secondary | ICD-10-CM | POA: Diagnosis not present

## 2015-09-24 DIAGNOSIS — C159 Malignant neoplasm of esophagus, unspecified: Secondary | ICD-10-CM | POA: Insufficient documentation

## 2015-09-24 DIAGNOSIS — Z79899 Other long term (current) drug therapy: Secondary | ICD-10-CM | POA: Diagnosis not present

## 2015-09-24 DIAGNOSIS — R0789 Other chest pain: Secondary | ICD-10-CM

## 2015-09-24 DIAGNOSIS — C3490 Malignant neoplasm of unspecified part of unspecified bronchus or lung: Secondary | ICD-10-CM | POA: Diagnosis not present

## 2015-09-24 DIAGNOSIS — C7800 Secondary malignant neoplasm of unspecified lung: Secondary | ICD-10-CM

## 2015-09-24 DIAGNOSIS — Z7982 Long term (current) use of aspirin: Secondary | ICD-10-CM | POA: Insufficient documentation

## 2015-09-24 DIAGNOSIS — K219 Gastro-esophageal reflux disease without esophagitis: Secondary | ICD-10-CM | POA: Diagnosis not present

## 2015-09-24 LAB — BASIC METABOLIC PANEL
ANION GAP: 8 (ref 5–15)
BUN: 17 mg/dL (ref 6–20)
CO2: 26 mmol/L (ref 22–32)
Calcium: 8.9 mg/dL (ref 8.9–10.3)
Chloride: 100 mmol/L — ABNORMAL LOW (ref 101–111)
Creatinine, Ser: 1.24 mg/dL (ref 0.61–1.24)
Glucose, Bld: 142 mg/dL — ABNORMAL HIGH (ref 65–99)
POTASSIUM: 4.1 mmol/L (ref 3.5–5.1)
SODIUM: 134 mmol/L — AB (ref 135–145)

## 2015-09-24 LAB — CBC
HEMATOCRIT: 33.2 % — AB (ref 39.0–52.0)
HEMOGLOBIN: 10.8 g/dL — AB (ref 13.0–17.0)
MCH: 28.6 pg (ref 26.0–34.0)
MCHC: 32.5 g/dL (ref 30.0–36.0)
MCV: 87.8 fL (ref 78.0–100.0)
Platelets: 185 10*3/uL (ref 150–400)
RBC: 3.78 MIL/uL — AB (ref 4.22–5.81)
RDW: 15.6 % — ABNORMAL HIGH (ref 11.5–15.5)
WBC: 3 10*3/uL — AB (ref 4.0–10.5)

## 2015-09-24 LAB — TROPONIN I

## 2015-09-24 MED ORDER — ASPIRIN 325 MG PO TABS
325.0000 mg | ORAL_TABLET | Freq: Once | ORAL | Status: AC
  Administered 2015-09-24: 325 mg via ORAL
  Filled 2015-09-24: qty 1

## 2015-09-24 MED ORDER — GI COCKTAIL ~~LOC~~
30.0000 mL | Freq: Once | ORAL | Status: AC
  Administered 2015-09-24: 30 mL via ORAL
  Filled 2015-09-24: qty 30

## 2015-09-24 MED ORDER — FAMOTIDINE 20 MG PO TABS: 20.0000 mg | ORAL_TABLET | Freq: Two times a day (BID) | ORAL | 0 refills | Status: AC

## 2015-09-24 MED ORDER — IOPAMIDOL (ISOVUE-370) INJECTION 76%
100.0000 mL | Freq: Once | INTRAVENOUS | Status: AC | PRN
  Administered 2015-09-24: 100 mL via INTRAVENOUS

## 2015-09-24 MED ORDER — SODIUM CHLORIDE 0.9 % IV BOLUS (SEPSIS)
1000.0000 mL | Freq: Once | INTRAVENOUS | Status: AC
  Administered 2015-09-24: 1000 mL via INTRAVENOUS

## 2015-09-24 NOTE — ED Notes (Signed)
Pt offered soda and crackers
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
## 2015-09-24 NOTE — ED Triage Notes (Signed)
Pt comes in with right side chest pain. He states this started yesterday then went away, he slept through the night, woke up and had pains again. No pain at present. Pt has a port in place that he states was "clamped" in the last month.

## 2015-09-25 ENCOUNTER — Telehealth (HOSPITAL_COMMUNITY): Payer: Self-pay | Admitting: Oncology

## 2015-09-25 ENCOUNTER — Other Ambulatory Visit (HOSPITAL_COMMUNITY): Payer: Self-pay | Admitting: Oncology

## 2015-09-25 NOTE — Telephone Encounter (Signed)
Peer to peer completed.  CT of chest is deleted as he had a CT angio of chest yesterday.  Therefore, repeat chest imaging is not needed.  Order is unlinked from radiology appt. And order is deleted.  CT abd/pelvis is approved knowing of the patient's new left pelvic complaint he called about the other day.  Approval # is [id number]  Doy Mince 09/25/2015 2:10 PM

## 2015-10-02 ENCOUNTER — Ambulatory Visit (HOSPITAL_COMMUNITY)
Admission: RE | Admit: 2015-10-02 | Discharge: 2015-10-02 | Disposition: A | Discharge status: Home / Self Care | Payer: Medicaid Other | Source: Ambulatory Visit | Attending: Hematology & Oncology | Admitting: Hematology & Oncology

## 2015-10-02 DIAGNOSIS — C787 Secondary malignant neoplasm of liver and intrahepatic bile duct: Secondary | ICD-10-CM | POA: Diagnosis present

## 2015-10-02 DIAGNOSIS — C155 Malignant neoplasm of lower third of esophagus: Secondary | ICD-10-CM | POA: Diagnosis not present

## 2015-10-02 MED ORDER — IOPAMIDOL (ISOVUE-300) INJECTION 61%
100.0000 mL | Freq: Once | INTRAVENOUS | Status: AC | PRN
  Administered 2015-10-02: 100 mL via INTRAVENOUS

## 2015-10-03 ENCOUNTER — Other Ambulatory Visit (HOSPITAL_COMMUNITY): Payer: Self-pay | Admitting: Oncology

## 2015-10-03 DIAGNOSIS — C155 Malignant neoplasm of lower third of esophagus: Secondary | ICD-10-CM

## 2015-10-03 MED ORDER — HYDROCODONE-ACETAMINOPHEN 10-325 MG PO TABS: 1.0000 | ORAL_TABLET | ORAL | 0 refills | Status: AC | PRN
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
## 2015-10-09 ENCOUNTER — Encounter (HOSPITAL_BASED_OUTPATIENT_CLINIC_OR_DEPARTMENT_OTHER): Payer: Medicaid Other | Admitting: Oncology

## 2015-10-09 ENCOUNTER — Encounter (HOSPITAL_COMMUNITY): Payer: Self-pay | Admitting: Lab

## 2015-10-09 ENCOUNTER — Encounter (HOSPITAL_COMMUNITY): Payer: Medicaid Other

## 2015-10-09 ENCOUNTER — Encounter (HOSPITAL_COMMUNITY): Payer: Self-pay | Admitting: Hematology & Oncology

## 2015-10-09 VITALS — BP 128/65 | HR 82 | Temp 98.6°F | Resp 16 | Wt 134.4 lb

## 2015-10-09 DIAGNOSIS — K769 Liver disease, unspecified: Secondary | ICD-10-CM | POA: Diagnosis not present

## 2015-10-09 DIAGNOSIS — C159 Malignant neoplasm of esophagus, unspecified: Secondary | ICD-10-CM | POA: Diagnosis not present

## 2015-10-09 DIAGNOSIS — C155 Malignant neoplasm of lower third of esophagus: Secondary | ICD-10-CM | POA: Diagnosis not present

## 2015-10-09 DIAGNOSIS — Z206 Contact with and (suspected) exposure to human immunodeficiency virus [HIV]: Secondary | ICD-10-CM

## 2015-10-09 MED ORDER — OXYCODONE HCL 10 MG PO TABS: 10.0000 mg | ORAL_TABLET | ORAL | 0 refills | Status: AC | PRN

## 2015-10-09 NOTE — Assessment & Plan Note (Signed)
Initially, stage IIIA esophageal carcinoma, squamous cell, HER2 NEGATIVE.   He is S/P weekly carboplatin/paclitaxel with radiation therapy finishing in June 2016 in the curative setting.  Unfortunately, PET scan demonstrated a hypermetabolic superior mediastinal paraesophageal lesion, thereby precluding surgical intervention.  As a result, he went back on treatment with Xeloda + XRT.  He completed XRT on 12/12/2014.  He continued single-agent Xeloda 1500 mg BID 7 days on and 7 days off from 01/26/2015- 03/18/2015.  Restaging CT imaging on 03/14/2015 demonstrated progressive disease with hepatic lesion resulting in an upstaging his disease to a STAGE IV status.  Now on weekly Cisplatin/Irinotecan beginning on 03/27/2015 with the addition of Cyramza beginning on 05/15/2015 with discontinuation of treatment on 06/06/2015 due to issues with blood counts.  Oncology history is updated.  Labs today: HIV antibody.  I personally reviewed and went over laboratory results with the patient.  The results are noted within this dictation.  Anthony Cameron recently underwent restaging tests with CT scans.  CT abd/pelvis demonstrates a significant enlargement of hepatic lesion, measuring 9 cm in largest dimension, more than 3 times larger compared to 3 months ago.  Anthony Cameron has been non-participatory in discussions regarding goals of care.  After reviewing his scans, he was more open to discussing his goals.  I reviewed the NCCN guidelines regarding treatment options.  He has exhausted all treatment options moving forward.  Therefore, goals of care was broached again.  He reports that quality of life is more important than quantity.  He is interested in symptom management moving forward with palliative management.  We discussed Hospice and he agrees that this will be the best management option moving forward.  Referral to Hospice is made.  I refilled the patient's pain medications today.  He reports that he has "messed" around with someone  who is sick or recently passed from AIDS.  He is very interested in getting tested himself.  He is now in a relationship with another woman and wants to assure her that he is not a carrier of HIV.    I have discussed his oncology case with his sister, Anthony Cameron, via telephone at the patient's request.  She is apprised of the new developments [name]'s care and his recent imaging results.

## 2015-10-09 NOTE — Progress Notes (Unsigned)
Referral sent to Hospice.  Records faxed on 8/24.  They will contact patient.

## 2015-10-09 NOTE — Patient Instructions (Addendum)
[place] at [facility] Discharge Instructions  RECOMMENDATIONS MADE BY THE CONSULTANT AND ANY TEST RESULTS WILL BE SENT TO YOUR REFERRING PHYSICIAN.  You saw Anthony Cameron today. You had labs drawn today. Gershon Mussel is referring you to Hospice and will call your sister.  Thank you for choosing [place] at [facility] to provide your oncology and hematology care.  To afford each patient quality time with our provider, please arrive at least 15 minutes before your scheduled appointment time.   Beginning January 23rd 2017 lab work for the [facility] will be done in the  Main lab at [facility] on 1st floor. If you have a lab appointment with the [facility] please come in thru the  Main Entrance and check in at the main information desk  You need to re-schedule your appointment should you arrive 10 or more minutes late.  We strive to give you quality time with our providers, and arriving late affects you and other patients whose appointments are after yours.  Also, if you no show three or more times for appointments you may be dismissed from the clinic at the providers discretion.     Again, thank you for choosing [facility].  Our hope is that these requests will decrease the amount of time that you wait before being seen by our physicians.       _____________________________________________________________  Should you have questions after your visit to [facility], please contact our office at [phone] between the hours of 8:30 a.m. and 4:30 p.m.  Voicemails left after 4:30 p.m. will not be returned until the following business day.  For prescription refill requests, have your pharmacy contact our office.         Resources For Cancer Patients and their Caregivers ? American Cancer Society: Can assist with transportation, wigs, general needs, runs Look Good Feel Better.        219-071-2245 ? Cancer  Care: Provides financial assistance, online support groups, medication/co-pay assistance.  1-800-813-HOPE 5403826362) ? [employer] Assists [place] cancer patients and their families through emotional , educational and financial support.  337-179-4241 ? [place] DSS Where to apply for food stamps, Medicaid and utility assistance. 212-749-9633 ? [facility]: Transportation to medical appointments. (920)332-7030 ? Social Security Administration: May apply for disability if have a Stage IV cancer. 438-876-1599 867-819-1451 ? [employer], Disability and Transit Services: Assists with nutrition, care and transit needs. [place] Support Programs: '@10RELATIVEDAYS'$ @ > Cancer Support Group  2nd Tuesday of the month 1pm-2pm, Journey Room  > Creative Journey  3rd Tuesday of the month 1130am-1pm, Journey Room  > Look Good Feel Better  1st Wednesday of the month 10am-12 noon, Journey Room (Call [facility] to register [phone])

## 2015-10-10 LAB — HIV ANTIBODY (ROUTINE TESTING W REFLEX): HIV SCREEN 4TH GENERATION: NONREACTIVE

## 2015-10-18 NOTE — Progress Notes (Signed)
This encounter was created in error - please disregard.

## 2015-11-11 ENCOUNTER — Encounter (HOSPITAL_COMMUNITY): Payer: Medicaid Other

## 2015-11-16 DEATH — deceased

## 2017-04-14 IMAGING — CT CT CHEST W/O CM
2 of 4 series · 15 of 36 positions shown, 18 images · non-contrast
Comparison: 06/05/2015

CLINICAL DATA: Bilateral pleural effusions

EXAM:
CT CHEST WITHOUT CONTRAST
TECHNIQUE: Multidetector CT imaging of the chest was performed following the
standard protocol without IV contrast.

[Series 2: chestroutine 2.0 b40f · axial · 0.66mm/px · z∈[-312,-32]mm · 12 of 156 slices shown, 15 images]
[im 8/156  mediastinal]
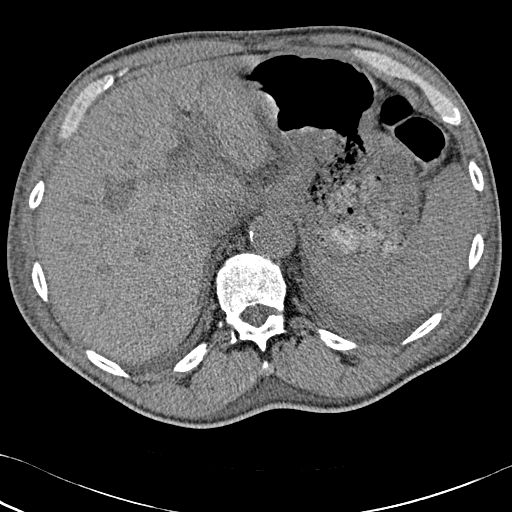
[im 8/156  lung]
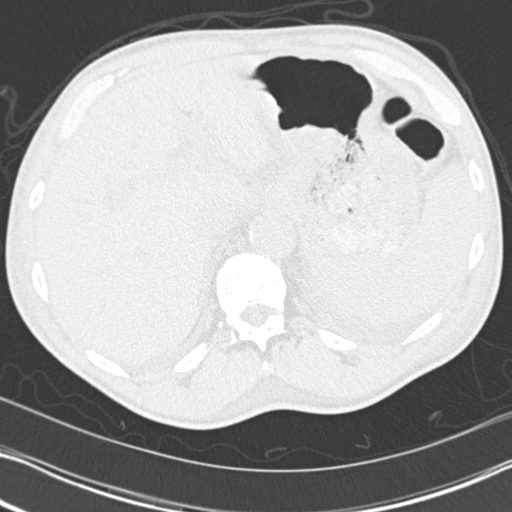
[im 22/156  lung]
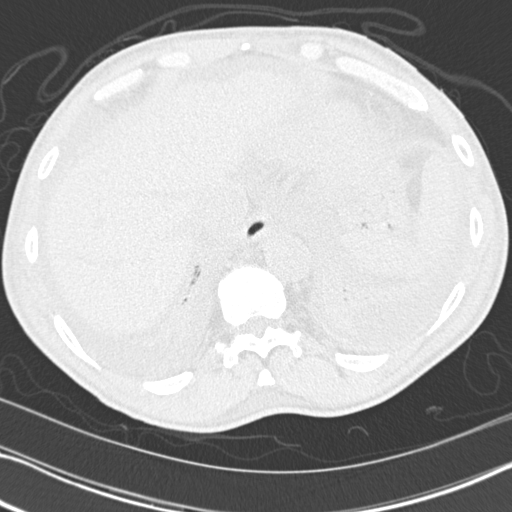
[im 36/156  lung]
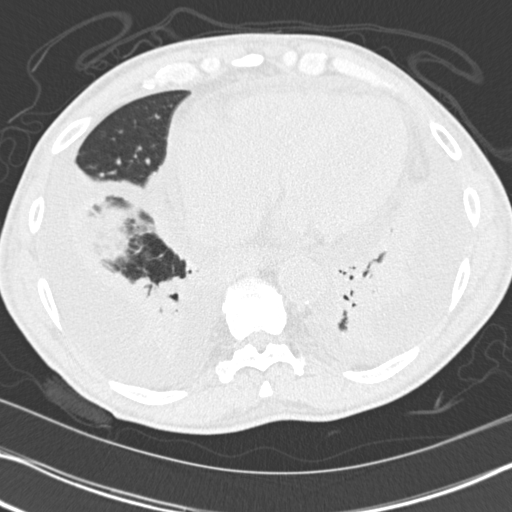
[im 50/156  lung]
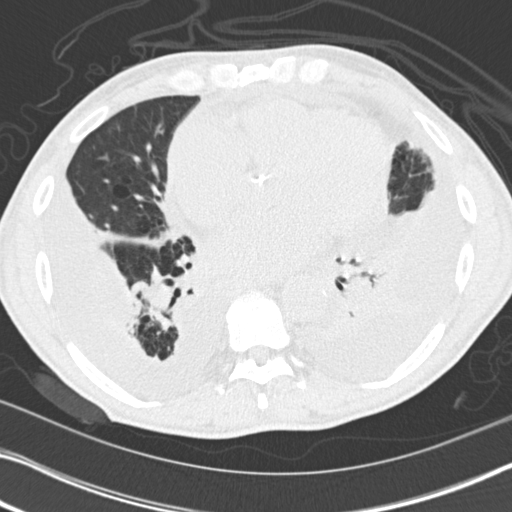
[im 57/156  mediastinal]
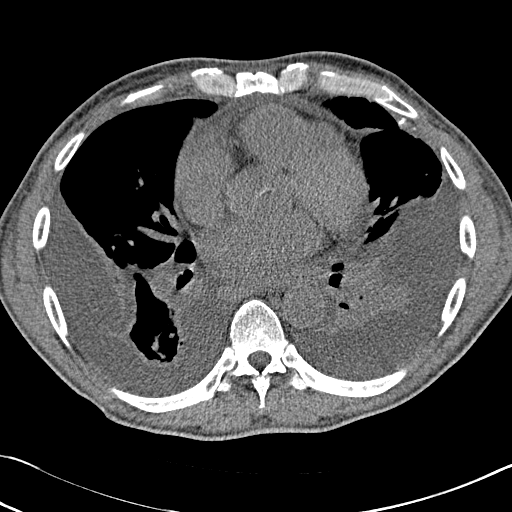
[im 57/156  lung]
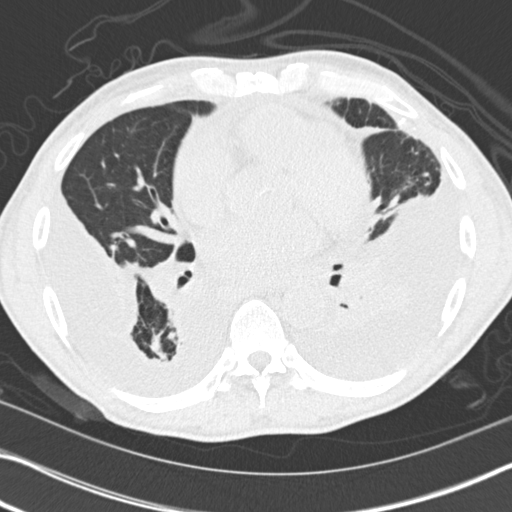
[im 71/156  lung]
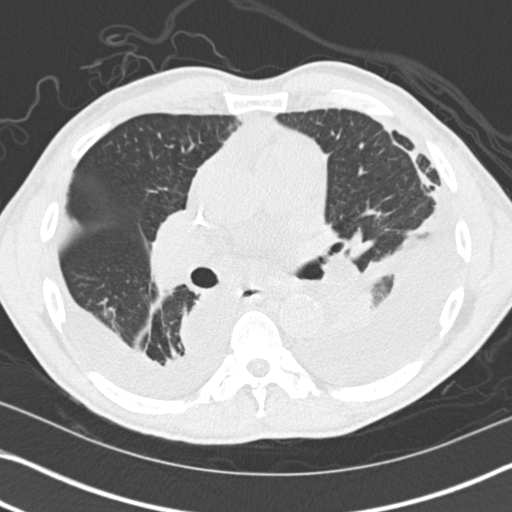
[im 85/156  lung]
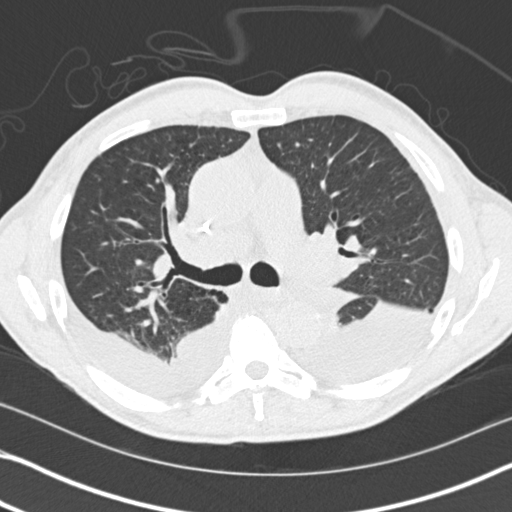
[im 99/156  lung]
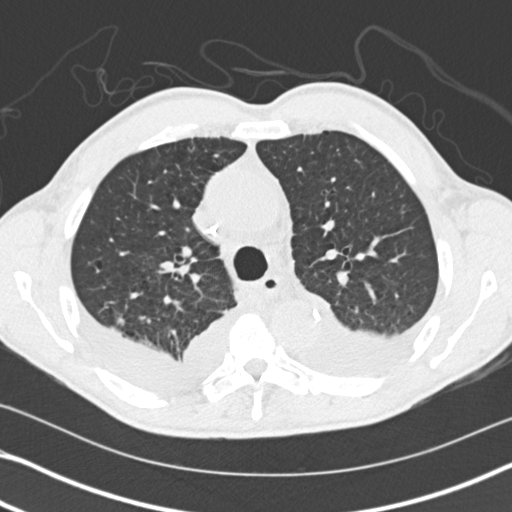
[im 106/156  mediastinal]
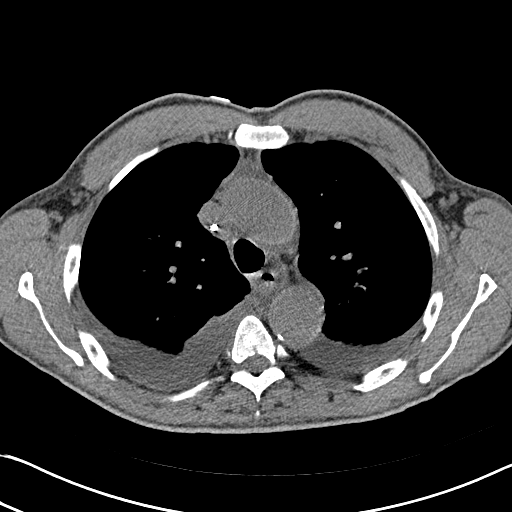
[im 106/156  lung]
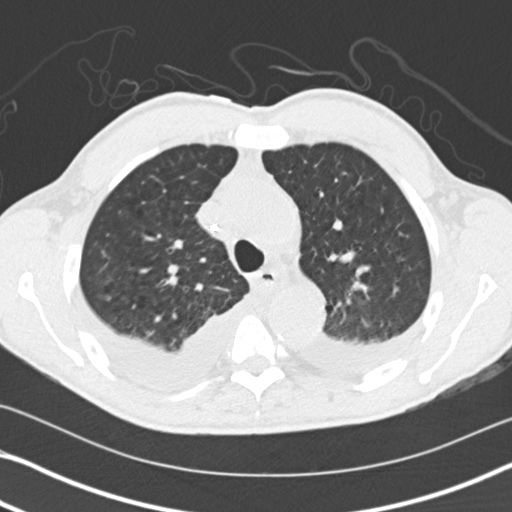
[im 120/156  lung]
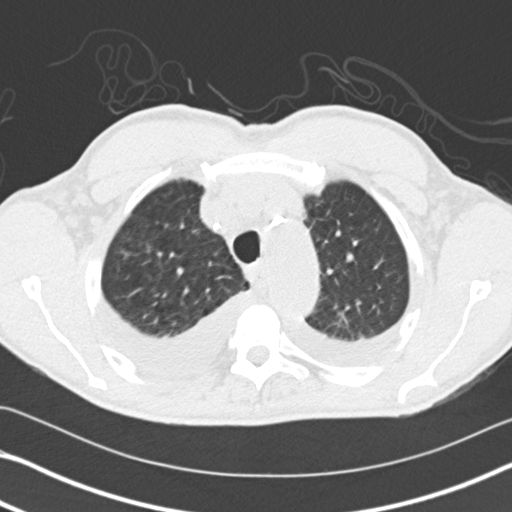
[im 134/156  lung]
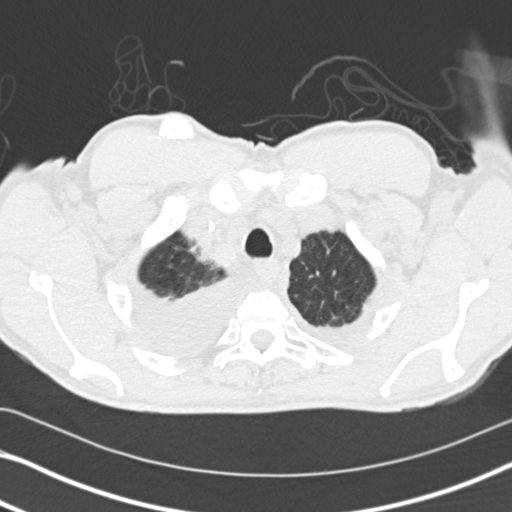
[im 148/156  lung]
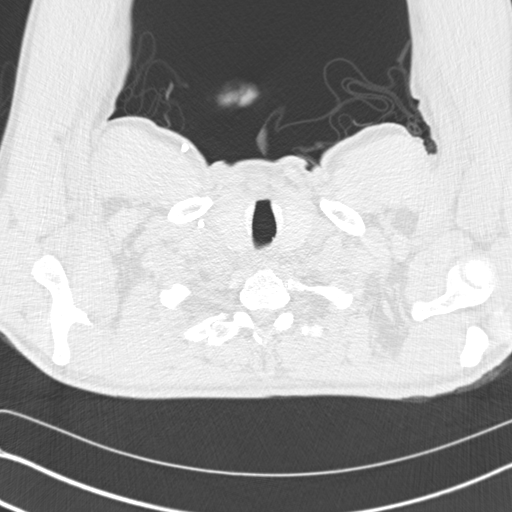

[Series 4: mpr coro 3mm · coronal · 0.64mm/px · 3 of 134 slices shown]
[im 27/134  lung]
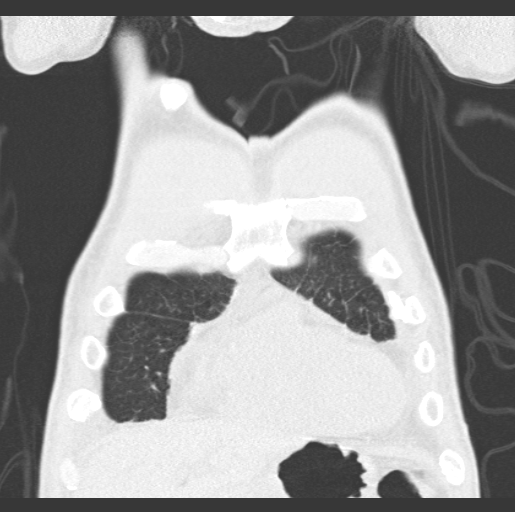
[im 54/134  lung]
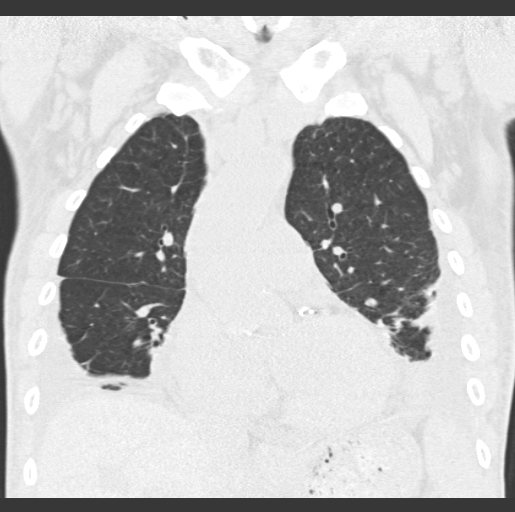
[im 80/134  lung]
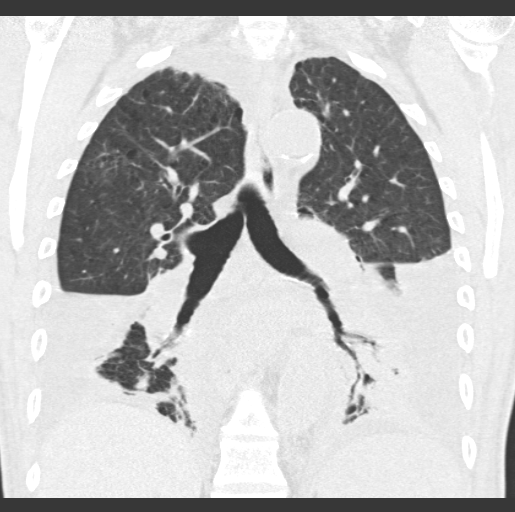

[15 of 36 positions shown; findings below may reference images not displayed]

FINDINGS: Large right-sided pleural effusion is again identified and shows
some changes suggestive of loculation similar to that seen on the
prior exam. No significant increase is noted. There is some improved
aeration in the right lower lobe when compared with the prior study.
Scattered calcified granulomas as well as nodular changes are noted
within the right lung stable from the recent exam.

The left hemi thorax also demonstrates persistent pleural effusion.
The overall appearance of the fluid has increased slightly in the
interval from the prior exam. Changes again consistent with
loculation are noted. Increase in the degree of left lower lobe
consolidation is noted. The previously seen nodules are less well
appreciated due to the consolidation.

The thoracic aorta shows calcifications and is stable. The thoracic
inlet is within normal limits. A right-sided chest wall port is
seen. No definitive lymphadenopathy is identified at this time.
Coronary calcifications are again seen.

The visualized upper abdomen again demonstrates a hypodense lesion
within the liver similar to that seen on prior exam from 05/13/2015.
This is consistent with metastatic disease. The bony structures are
within normal limits.
IMPRESSION: Stable appearance of right-sided pleural effusion with improved
aeration in the right lung when compared with the prior exam.

In the crease seeing left-sided loculated pleural effusion with
increased basilar consolidation when compared with the prior study.

Pulmonary nodules somewhat obscured when compared with the prior
exam but again consistent with metastatic disease.

Hepatic metastatic disease.

## 2018-04-01 ENCOUNTER — Other Ambulatory Visit: Payer: Self-pay | Admitting: Nurse Practitioner
